# Patient Record
Sex: Male | Born: 1965 | Race: White | Hispanic: No | Marital: Single | Smoking: Never smoker
Health system: Southern US, Community
[De-identification: ages and names within clinical notes are randomized; demographics above are authoritative.]

## PROBLEM LIST (undated history)

## (undated) DIAGNOSIS — I10 Essential (primary) hypertension: Secondary | ICD-10-CM

## (undated) DIAGNOSIS — E039 Hypothyroidism, unspecified: Secondary | ICD-10-CM

## (undated) DIAGNOSIS — G2401 Drug induced subacute dyskinesia: Secondary | ICD-10-CM

## (undated) DIAGNOSIS — T7840XA Allergy, unspecified, initial encounter: Secondary | ICD-10-CM

## (undated) HISTORY — DX: Allergy, unspecified, initial encounter: T78.40XA

## (undated) HISTORY — PX: COLON SURGERY: SHX602

## (undated) HISTORY — PX: ANKLE SURGERY: SHX546

## (undated) HISTORY — PX: TONSILLECTOMY: SUR1361

## (undated) HISTORY — PX: HERNIA REPAIR: SHX51

---

## 2005-08-25 ENCOUNTER — Ambulatory Visit: Payer: Self-pay | Admitting: Surgery

## 2009-12-27 ENCOUNTER — Emergency Department: Payer: Self-pay | Admitting: Emergency Medicine

## 2009-12-29 ENCOUNTER — Emergency Department: Payer: Self-pay | Admitting: Unknown Physician Specialty

## 2013-04-29 ENCOUNTER — Emergency Department: Payer: Self-pay | Admitting: Emergency Medicine

## 2013-08-21 DIAGNOSIS — J309 Allergic rhinitis, unspecified: Secondary | ICD-10-CM | POA: Insufficient documentation

## 2013-08-21 DIAGNOSIS — J302 Other seasonal allergic rhinitis: Secondary | ICD-10-CM | POA: Insufficient documentation

## 2013-08-21 DIAGNOSIS — S82209A Unspecified fracture of shaft of unspecified tibia, initial encounter for closed fracture: Secondary | ICD-10-CM | POA: Insufficient documentation

## 2015-05-09 DIAGNOSIS — R6250 Unspecified lack of expected normal physiological development in childhood: Secondary | ICD-10-CM | POA: Insufficient documentation

## 2015-05-09 DIAGNOSIS — E343 Short stature due to endocrine disorder: Secondary | ICD-10-CM | POA: Insufficient documentation

## 2015-05-09 DIAGNOSIS — E3431 Constitutional short stature: Secondary | ICD-10-CM | POA: Insufficient documentation

## 2015-05-09 DIAGNOSIS — Z800 Family history of malignant neoplasm of digestive organs: Secondary | ICD-10-CM | POA: Insufficient documentation

## 2015-05-09 DIAGNOSIS — I10 Essential (primary) hypertension: Secondary | ICD-10-CM | POA: Insufficient documentation

## 2015-06-24 LAB — HM COLONOSCOPY

## 2015-09-26 DIAGNOSIS — R600 Localized edema: Secondary | ICD-10-CM | POA: Insufficient documentation

## 2016-03-18 DIAGNOSIS — M1A9XX1 Chronic gout, unspecified, with tophus (tophi): Secondary | ICD-10-CM | POA: Insufficient documentation

## 2016-06-16 ENCOUNTER — Other Ambulatory Visit: Payer: Self-pay | Admitting: Infectious Diseases

## 2016-06-16 ENCOUNTER — Ambulatory Visit
Admission: RE | Admit: 2016-06-16 | Discharge: 2016-06-16 | Disposition: A | Discharge status: Home / Self Care | Payer: Medicare Other | Source: Ambulatory Visit | Attending: Infectious Diseases | Admitting: Infectious Diseases

## 2016-06-16 DIAGNOSIS — R600 Localized edema: Secondary | ICD-10-CM | POA: Insufficient documentation

## 2018-01-18 ENCOUNTER — Encounter: Payer: Self-pay | Admitting: Psychiatry

## 2018-01-18 ENCOUNTER — Ambulatory Visit: Payer: 59 | Admitting: Psychiatry

## 2018-01-18 ENCOUNTER — Other Ambulatory Visit: Payer: Self-pay

## 2018-01-18 VITALS — BP 115/83 | HR 71 | Temp 97.7°F | Ht 64.0 in | Wt 174.4 lb

## 2018-01-18 DIAGNOSIS — F39 Unspecified mood [affective] disorder: Secondary | ICD-10-CM | POA: Diagnosis not present

## 2018-01-18 DIAGNOSIS — G2119 Other drug induced secondary parkinsonism: Secondary | ICD-10-CM | POA: Diagnosis not present

## 2018-01-18 DIAGNOSIS — R6250 Unspecified lack of expected normal physiological development in childhood: Secondary | ICD-10-CM | POA: Insufficient documentation

## 2018-01-18 DIAGNOSIS — F70 Mild intellectual disabilities: Secondary | ICD-10-CM

## 2018-01-18 MED ORDER — BENZTROPINE MESYLATE 1 MG PO TABS: 1.0000 mg | ORAL_TABLET | Freq: Two times a day (BID) | ORAL | 1 refills | Status: DC

## 2018-01-18 NOTE — Progress Notes (Signed)
Psychiatric Initial Adult Assessment   Patient Identification: Andres [name] MRN:  [id number] Date of Evaluation:  01/18/2018 Referral Source: Webb Silversmith NP  Chief Complaint:   ' I have these tremors.'  Chief Complaint    Establish Care; Depression     Visit Diagnosis:    ICD-10-CM   1. 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 Episodic mood disorder (HCC) F39   3. Mild intellectual disability F70     History of Present Illness:Andres [name] is a 52 year old Caucasian male, has a history of mood disorder, recent onset drug-induced Parkinson's disease, mild intellectual disability, single ,on SSD, lives in [place] , presented today to establish care.  [name] has mild intellectual disability and appears to be a limited historian.  Hence his father Andres [name] and his Sister Andres [name] participated in the assessment.  According to family , Hameed had a few admissions to [facility] in the 1980s.  Patient at that time had a nervous breakdown.  They are not aware of his diagnosis.  According to family during that time he was started on medications.  He was started on medications like risperidone and Depakote and he continues to take them.  He does not have any significant mood disorder symptoms right now.  But per family if he stops taking his medication for for a while he can have some increased agitation and irritability and also some hallucinations.  And according to them this can happen once every year or so.  He is currently following up with a psychiatrist in [facility]. [place].  Dr. Mitzi Hansen has been prescribing home Depakote 1500 mg and risperidone 3 mg daily.  He had a recent appointment with Dr. Mitzi Hansen and his risperidone dose was reduced to 3 mg at that time from 4 mg.  According to family patient started having some movement disorder symptoms, tremors of bilateral hands as well as some postural tremors.  His tremors worsened the past 2 months or so.   He also appeared to have some gait instability/balance issues.  His primary medical doctor referred him to an ENT specialist and  the ENT specialist after evaluation send him to a neurologist.  As per neurology consult done on 01/09/2018 -per EHR - Dr.Shah -most likely drug-induced from Depakote and risperidone.  His propranolol was discontinued due to low blood pressure and heart rate.  He was started on Cogentin 0.5 mg twice a day. Patient also was referred to our clinic here for continued care.  As per family patient currently does not have any other problems.  Denies any perceptual disturbances at this time.  Denies any irritability or anger issues at this time.  Denies any history of manic symptoms.  Denies any history of trauma.  Denies any history of substance abuse problems.  Denies any history of significant medical problems other than his current troubles and balance problems.  His father is his payee.  He spends his time watching TV.  He is able to take care of his ADLs.      Associated Signs/Symptoms: Depression Symptoms:  denies  (Hypo) Manic Symptoms:  Distractibility, Impulsivity, Irritable Mood, Labiality of Mood, Anxiety Symptoms:  denies Psychotic Symptoms:  Hallucinations: Auditory- in the past  PTSD Symptoms: Negative  Past Psychiatric History: History of mental health issues since 78.  He does have intellectual disability likely mild.  He was admitted to [facility] in [place] x3 years ago .  As per family they did not know  the exact diagnosis but notes that he had a nervous breakdown.  Denies any suicide attempts.  He was seeing Dr. Uvaldo Rising for outpatient psychiatry in the past , thereafter started seeing Dr. Mitzi Hansen in [place].  Previous Psychotropic Medications: Yes , Risperidone , Depakote, propanolol.  Substance Abuse History in the last 12 months:  No.  Consequences of Substance Abuse: Negative  Past Medical History:  Past Medical History:   Diagnosis Date  . Depression     Past Surgical History:  Procedure Laterality Date  . ANKLE SURGERY    . COLON SURGERY    . HERNIA REPAIR    . TONSILLECTOMY      Family Psychiatric History: Denies any history of mental health problems in family.  Denies any history of suicide in family.  Family History:  Family History  Problem Relation Age of Onset  . Colon cancer Other   . Mental illness Neg Hx     Social History:   Social History   Socioeconomic History  . Marital status: Single    Spouse name: None  . Number of children: 0  . Years of education: None  . Highest education level: None  Social Needs  . Financial resource strain: Not hard at all  . Food insecurity - worry: Never true  . Food insecurity - inability: Never true  . Transportation needs - medical: Yes  . Transportation needs - non-medical: Yes  Occupational History    Comment: disabled  Tobacco Use  . Smoking status: Never Smoker  . Smokeless tobacco: Never Used  Substance and Sexual Activity  . Alcohol use: No    Frequency: Never  . Drug use: No  . Sexual activity: Not Currently  Other Topics Concern  . None  Social History Narrative  . None    Additional Social History: Patient currently lives with his parents in [place].  He has a history of mild intellectual disability.  He graduated high school.  His father is his payee .  He is on disability.  He has 3 other siblings and they are supportive.  Allergies:  No Known Allergies  Metabolic Disorder Labs: No results found for: HGBA1C, MPG No results found for: PROLACTIN No results found for: CHOL, TRIG, HDL, CHOLHDL, VLDL, LDLCALC   Current Medications: Current Outpatient Medications  Medication Sig Dispense Refill  . divalproex (DEPAKOTE) 500 MG DR tablet Take by mouth.    . fexofenadine (ALLEGRA) 180 MG tablet Take 180 mg by mouth.    . indomethacin (INDOCIN) 50 MG capsule Take by mouth.    . risperiDONE (RISPERDAL) 1 MG tablet Take  by mouth.    . benztropine (COGENTIN) 1 MG tablet Take 1 tablet (1 mg total) by mouth 2 (two) times daily. 60 tablet 1   No current facility-administered medications for this visit.     Neurologic: Headache: No Seizure: No Paresthesias:No  Musculoskeletal: Strength & Muscle Tone: within normal limits Gait & Station: normal Patient leans: N/A  Psychiatric Specialty Exam: Review of Systems  Neurological: Positive for tremors.  All other systems reviewed and are negative.   Blood pressure 115/83, pulse 71, temperature 97.7 F (36.5 C), temperature source Oral, height 5\' 4"  (1.626 m), weight 174 lb 6.4 oz (79.1 kg).Body mass index is 29.94 kg/m.  General Appearance: Casual  Eye Contact:  Fair  Speech:  Normal Rate  Volume:  Normal  Mood:  Euthymic  Affect:  Congruent  Thought Process:  Goal Directed and Descriptions of Associations: Intact  Orientation:  Full (Time, Place, and Person)  Thought Content:  Logical  Suicidal Thoughts:  No  Homicidal Thoughts:  denies  Memory:  Immediate;   Fair Recent;   Fair Remote;   Fair  Judgement:  Fair  Insight:  Fair  Psychomotor Activity:  Tremor  Concentration:  Concentration: Fair and Attention Span: Fair  Recall:  [employer]Fair  Fund of Knowledge:Fair  Language: Fair  Akathisia:  No  Handed:  Right  AIMS (if indicated):  12  Assets:  Desire for Improvement Financial Resources/Insurance Housing Social Support  ADL's:  Intact  Cognition: Impaired,  Mild  Sleep:  fair    Treatment Plan Summary: [name] is a 52 year old Caucasian male with history of likely mild intellectual disability, mood disorder, drug induced parkinsonism, presented to the clinic today to establish care.  Patient was referred to our clinic by his neurologist Dr. Sherryll BurgerShah as well as his PMD.  Patient had recent medication changes, his risperidone was reduced to 3 mg as well as Cogentin added to help address his possible drug-induced Parkinson's symptoms.  Patient  otherwise denies any significant mood symptoms at this time.  He denies any suicidality or homicidality.  He denies any substance abuse problems.  He does have good support system from family. Medication management and Plan as noted below   Plan  For mood disorder Continue risperidone as scheduled.  Per family he is on 3 mg at this time. Continue Depakote 1500 mg p.o. nightly AIMS = 12  For drug-induced Parkinson's Increase Cogentin to 1 mg p.o. twice daily We will continue to monitor his symptoms.  I have reviewed reviewed  neurology consult in EHR Per Dr. Sherryll BurgerShah  Will get the following labs- vitamin B12, folate, vitamin D, CBC, CMP, Depakote level, prolactin  Patient to sign release to obtain medical records from his psychiatrist - Dr.Moody.  Follow-up in 3 weeks or sooner if needed.  More than 50 % of the time was spent for psychoeducation and supportive psychotherapy and care coordination.  This note was generated in part or whole with voice recognition software. Voice recognition is usually quite accurate but there are transcription errors that can and very often do occur. I apologize for any typographical errors that were not detected and corrected.       Jomarie LongsSaramma Zacari Stiff, MD 1/23/20194:04 PM

## 2018-02-08 ENCOUNTER — Other Ambulatory Visit: Payer: Self-pay

## 2018-02-08 ENCOUNTER — Encounter: Payer: Self-pay | Admitting: Psychiatry

## 2018-02-08 ENCOUNTER — Ambulatory Visit (INDEPENDENT_AMBULATORY_CARE_PROVIDER_SITE_OTHER): Payer: 59 | Admitting: Psychiatry

## 2018-02-08 VITALS — BP 110/74 | HR 73 | Wt 178.8 lb

## 2018-02-08 DIAGNOSIS — F39 Unspecified mood [affective] disorder: Secondary | ICD-10-CM

## 2018-02-08 DIAGNOSIS — G2119 Other drug induced secondary parkinsonism: Secondary | ICD-10-CM

## 2018-02-08 DIAGNOSIS — F79 Unspecified intellectual disabilities: Secondary | ICD-10-CM

## 2018-02-08 MED ORDER — DIVALPROEX SODIUM ER 500 MG PO TB24: 500.0000 mg | ORAL_TABLET | Freq: Every day | ORAL | 0 refills | Status: DC

## 2018-02-08 MED ORDER — QUETIAPINE FUMARATE 25 MG PO TABS: 25.0000 mg | ORAL_TABLET | Freq: Every day | ORAL | 0 refills | Status: DC

## 2018-02-08 NOTE — Progress Notes (Signed)
BH MD OP Progress Note  02/08/2018 10:18 AM Andres [name]  MRN:  [id number]  Chief Complaint: ' I am ok." Chief Complaint    Follow-up; Medication Refill     HPI: Andres [name] a 52 year old Caucasian male, has a history of mood disorder, recent onset drug-induced Parkinson's disease, mild intellectual disability, single, on SSD, lives in [place], presented to the clinic for a follow-up visit.  He has mild intellectual disability and appears to be limited historian.  His Father Ree Kida and his Sister Lupita Leash participated in the assessment and provided collateral information.  Cleo reports he is doing better with regards to his involuntary movements.  He continues to have tremors of bilateral hands as well as tongue.  He is currently on Cogentin, increased dose and is tolerating well.  His risperidone dose was reduced last time and he tolerated it well.  He denies any mood symptoms or perceptual disturbances.  He continues to take Depakote on a regular basis.  He takes 1500 Depakote at bedtime.  However discussed with the patient as well as family that Depakote can be gradually tapered off since it could be causing his tremors as well as thrombocytopenia although chronic, stable.  His primary medical doctor faxed over his recent lab results to Clinical research associate .  His Depakote level is still pending though.  He reports sleep is good.  He denies any other significant concerns at this time. Visit Diagnosis:    ICD-10-CM   1. Episodic mood disorder (HCC) F39 QUEtiapine (SEROQUEL) 25 MG tablet    divalproex (DEPAKOTE ER) 500 MG 24 hr tablet  2. Intellectual disability F79 divalproex (DEPAKOTE ER) 500 MG 24 hr tablet   likely mild  3. Drug-induced Parkinsonism (HCC) G21.19     Past Psychiatric History: History of mental health issues since 65.  He does have intellectual disability likely mild.  He was admitted to [facility] in [place] times 3 years ago.  As per family they did not know the exact  diagnosis but notes that he had a nervous breakdown.  Denies any suicide attempts.  He was seeing Dr. Uvaldo Rising for outpatient psychiatry in the past, thereafter started seeing Dr. Mitzi Hansen in [place].  Past trials of risperidone, Depakote, propranolol.  Past Medical History:  Past Medical History:  Diagnosis Date  . Depression     Past Surgical History:  Procedure Laterality Date  . ANKLE SURGERY    . COLON SURGERY    . HERNIA REPAIR    . TONSILLECTOMY      Family Psychiatric History: Denies any history of mental health problems in family.  Denies any history of suicide in family.  Family History:  Family History  Problem Relation Age of Onset  . Colon cancer Other   . Mental illness Neg Hx    Substance abuse history: Denies  Social History: Currently lives with his parents in [place].  He has a history of mild intellectual disability.  He graduated high school.  His father is his payee.  He is on disability.  He has 3 other siblings and they are supportive. Social History   Socioeconomic History  . Marital status: Single    Spouse name: None  . Number of children: 0  . Years of education: None  . Highest education level: None  Social Needs  . Financial resource strain: Not hard at all  . Food insecurity - worry: Never true  . Food insecurity - inability: Never true  . Transportation needs -  medical: Yes  . Transportation needs - non-medical: Yes  Occupational History    Comment: disabled  Tobacco Use  . Smoking status: Never Smoker  . Smokeless tobacco: Never Used  Substance and Sexual Activity  . Alcohol use: No    Frequency: Never  . Drug use: No  . Sexual activity: Not Currently  Other Topics Concern  . None  Social History Narrative  . None    Allergies: No Known Allergies  Metabolic Disorder Labs: No results found for: HGBA1C, MPG No results found for: PROLACTIN No results found for: CHOL, TRIG, HDL, CHOLHDL, VLDL, LDLCALC No results found for:  TSH  Therapeutic Level Labs: No results found for: LITHIUM No results found for: VALPROATE No components found for:  CBMZ  Current Medications: Current Outpatient Medications  Medication Sig Dispense Refill  . allopurinol (ZYLOPRIM) 300 MG tablet Take 300 mg by mouth daily.    . benztropine (COGENTIN) 1 MG tablet Take 1 tablet (1 mg total) by mouth 2 (two) times daily. 60 tablet 1  . divalproex (DEPAKOTE ER) 500 MG 24 hr tablet Take 1 tablet (500 mg total) by mouth at bedtime. 30 tablet 0  . fexofenadine (ALLEGRA) 180 MG tablet Take 180 mg by mouth.    . indomethacin (INDOCIN) 50 MG capsule Take by mouth.    . propranolol (INDERAL) 40 MG tablet Take 40 mg by mouth 2 (two) times daily.    . QUEtiapine (SEROQUEL) 25 MG tablet Take 1 tablet (25 mg total) by mouth at bedtime. 30 tablet 0   No current facility-administered medications for this visit.      Musculoskeletal: Strength & Muscle Tone: within normal limits Gait & Station: normal Patient leans: N/A  Psychiatric Specialty Exam: Review of Systems  Neurological: Positive for tremors.  Psychiatric/Behavioral: The patient is nervous/anxious.   All other systems reviewed and are negative.   Blood pressure 110/74, pulse 73, weight 178 lb 12.8 oz (81.1 kg).Body mass index is 30.69 kg/m.  General Appearance: Casual  Eye Contact:  Fair  Speech:  Clear and Coherent  Volume:  Normal  Mood:  Anxious  Affect:  Appropriate  Thought Process:  Goal Directed and Descriptions of Associations: Intact  Orientation:  Full (Time, Place, and Person)  Thought Content: Logical   Suicidal Thoughts:  No  Homicidal Thoughts:  No  Memory:  Immediate;   Fair Recent;   Fair Remote;   Fair  Judgement:  Fair  Insight:  Fair  Psychomotor Activity:  Tremor  Concentration:  Concentration: Fair and Attention Span: Fair  Recall:  [employer] of Knowledge: Fair  Language: Fair  Akathisia:  No  Handed:  Right  AIMS (if indicated): 9  Assets:   Communication Skills Desire for Improvement Housing Social Support  ADL's:  Intact  Cognition: WNL  Sleep:  Fair   Screenings: AIMS     [facility] Visit from 02/08/2018 in [facility] Office Visit from 01/18/2018 in [facility]  AIMS Total Score  9  12       Assessment and Plan: [name] is a 52 year old Caucasian male with history of likely mild intellectual disability, mood disorder, drug induced parkinsonism, presented to the clinic today for a follow-up visit.  Patient was initially referred to the clinic by his neurologist Dr. Sherryll Burger as well as his PMD.  Patient had recent medication changes, his risperidone was reduced as well as Cogentin added to help address his possible drug-induced Parkinson's symptoms.  Kacin continues  to have tremors of his bilateral hands.  He tolerated the dose reduction of risperidone well.  Hence we will discontinue risperidone.  We will readjust his medications.  Will continue plan as noted below.  Plan For mood disorder Discontinue risperidone due to side effects of drug-induced parkinsonism. We will taper off Depakote gradually.  Will start at reduced dose of Depakote ER 500 mg p.o. nightly for now. Start Seroquel 25 mg p.o. Nightly to replace Risperidone.   For drug induced parkinsonism Continue Cogentin 1 mg p.o. twice daily Will taper of risperidone due to his abnormal movements.  We have not yet obtained any medical records from his previous psychiatrist Dr. Mitzi HansenMoody.  This is patient's second visit with Clinical research associatewriter.  We will try to obtain records again.    I have reviewed notes in EHR Per his other providers as well as neurologist.   Reviewed labs done on 01/20/2018-vitamin B12-579, folate 7.7-(within normal limits), valproic acid-(pending), vitamin D-21.3-(low), prolactin-(elevated )at 54.7 hemoglobin A1c-4.3-(within normal limits), TSH-9.647-(elevated), lipid panel-(within normal limits), CBC with  differential-WBC-3.8-(low), RBC-4.64-(borderline), hemoglobin --14.5-, hematocrit-44.5, MCV-95.9, platelet count-64-(low-chronic), neutrophils-1.44(low), CMP-sodium 146-(borderline high), BUN-32-(elevated), creatinine-0.9- wnl These results were faxed to writer by his provider-PMD Dr. Maralyn Sagoavid Fitzgerald-he has repeat TSH scheduled and vitamin D replacement has been started.  He will continue to follow-up with PMD for his abnormal labs.  But will readjust his Depakote to possibly address not only tremors but also abnormal platelets.  Follow up in clinic in 2 weeks .   More than 50 % of the time was spent for psychoeducation and supportive psychotherapy and care coordination.  This note was generated in part or whole with voice recognition software. Voice recognition is usually quite accurate but there are transcription errors that can and very often do occur. I apologize for any typographical errors that were not detected and corrected.        Jomarie LongsSaramma Brendy Ficek, MD 02/09/2018, 10:18 AM

## 2018-02-08 NOTE — Patient Instructions (Addendum)
Quetiapine tablets What is this medicine? QUETIAPINE (kwe TYE a peen) is an antipsychotic. It is used to treat schizophrenia and bipolar disorder, also known as manic-depression. This medicine may be used for other purposes; ask your health care provider or pharmacist if you have questions. COMMON BRAND NAME(S): Seroquel What should I tell my health care provider before I take this medicine? They need to know if you have any of these conditions: -brain tumor or head injury -breast cancer -cataracts -diabetes -difficulty swallowing -heart disease -kidney disease -liver disease -low blood counts, like low white cell, platelet, or red cell counts -low blood pressure or dizziness when standing up -Parkinson's disease -previous heart attack -seizures -suicidal thoughts, plans, or attempt by you or a family member -thyroid disease -an unusual or allergic reaction to quetiapine, other medicines, foods, dyes, or preservatives -pregnant or trying to get pregnant -breast-feeding How should I use this medicine? Take this medicine by mouth. Swallow it with a drink of water. Follow the directions on the prescription label. If it upsets your stomach you can take it with food. Take your medicine at regular intervals. Do not take it more often than directed. Do not stop taking except on the advice of your doctor or health care professional. A special MedGuide will be given to you by the pharmacist with each prescription and refill. Be sure to read this information carefully each time. Talk to your pediatrician regarding the use of this medicine in children. While this drug may be prescribed for children as young as 10 years for selected conditions, precautions do apply. Patients over age 50 years may have a stronger reaction to this medicine and need smaller doses. Overdosage: If you think you have taken too much of this medicine contact a poison control center or emergency room at once. NOTE: This  medicine is only for you. Do not share this medicine with others. What if I miss a dose? If you miss a dose, take it as soon as you can. If it is almost time for your next dose, take only that dose. Do not take double or extra doses. What may interact with this medicine? Do not take this medicine with any of the following medications: -certain medicines for fungal infections like fluconazole, itraconazole, ketoconazole, posaconazole, voriconazole -cisapride -dofetilide -dronedarone -droperidol -grepafloxacin -halofantrine -phenothiazines like chlorpromazine, mesoridazine, thioridazine -pimozide -sparfloxacin -ziprasidone This medicine may also interact with the following medications: -alcohol -antiviral medicines for HIV or AIDS -certain medicines for blood pressure -certain medicines for depression, anxiety, or psychotic disturbances like haloperidol, lorazepam -certain medicines for diabetes -certain medicines for Parkinson's disease -certain medicines for seizures like carbamazepine, phenobarbital, phenytoin -cimetidine -erythromycin -other medicines that prolong the QT interval (cause an abnormal heart rhythm) -rifampin -steroid medicines like prednisone or cortisone This list may not describe all possible interactions. Give your health care provider a list of all the medicines, herbs, non-prescription drugs, or dietary supplements you use. Also tell them if you smoke, drink alcohol, or use illegal drugs. Some items may interact with your medicine. What should I watch for while using this medicine? Visit your doctor or health care professional for regular checks on your progress. It may be several weeks before you see the full effects of this medicine. Your health care provider may suggest that you have your eyes examined prior to starting this medicine, and every 6 months thereafter. If you have been taking this medicine regularly for some time, do not suddenly stop taking it.  You must gradually  reduce the dose or your symptoms may get worse. Ask your doctor or health care professional for advice. Patients and their families should watch out for worsening depression or thoughts of suicide. Also watch out for sudden or severe changes in feelings such as feeling anxious, agitated, panicky, irritable, hostile, aggressive, impulsive, severely restless, overly excited and hyperactive, or not being able to sleep. If this happens, especially at the beginning of antidepressant treatment or after a change in dose, call your health care professional. Bonita QuinYou may get dizzy or drowsy. Do not drive, use machinery, or do anything that needs mental alertness until you know how this medicine affects you. Do not stand or sit up quickly, especially if you are an older patient. This reduces the risk of dizzy or fainting spells. Alcohol can increase dizziness and drowsiness. Avoid alcoholic drinks. Do not treat yourself for colds, diarrhea or allergies. Ask your doctor or health care professional for advice, some ingredients may increase possible side effects. This medicine can reduce the response of your body to heat or cold. Dress warm in cold weather and stay hydrated in hot weather. If possible, avoid extreme temperatures like saunas, hot tubs, very hot or cold showers, or activities that can cause dehydration such as vigorous exercise. What side effects may I notice from receiving this medicine? Side effects that you should report to your doctor or health care professional as soon as possible: -allergic reactions like skin rash, itching or hives, swelling of the face, lips, or tongue -difficulty swallowing -fast or irregular heartbeat -fever or chills, sore throat -fever with rash, swollen lymph nodes, or swelling of the face -increased hunger or thirst -increased urination -problems with balance, talking, walking -seizures -stiff muscles -suicidal thoughts or other mood  changes -uncontrollable head, mouth, neck, arm, or leg movements -unusually weak or tired Side effects that usually do not require medical attention (report to your doctor or health care professional if they continue or are bothersome): -change in sex drive or performance -constipation -drowsy or dizzy -dry mouth -stomach upset -weight gain This list may not describe all possible side effects. Call your doctor for medical advice about side effects. You may report side effects to FDA at 1-800-FDA-1088. Where should I keep my medicine? Keep out of the reach of children. Store at room temperature between 15 and 30 degrees C (59 and 86 degrees F). Throw away any unused medicine after the expiration date. NOTE: This sheet is a summary. It may not cover all possible information. If you have questions about this medicine, talk to your doctor, pharmacist, or health care provider.  2018 Elsevier/Gold Standard (2015-06-17 13:07:35)     STOP RISPERDAL DUE TO SIDE EFFECTS OF TREMORS.  TAPER OFF DEPAKOTE ER - NOT ONLY FOR TREMORS BUT ALSO DUE TO HAVING THROMBOCYTOPENIA. HENCE WILL REDUCE DEPAKOTE ER TO - 1 TABLET AT NIGHT.  START SEROQUEL TO REPLACE RISPERDAL. SEROQUEL HAS A VERY LOW EPS PROFILE - THAT MEANS IT DOES NOT CAUSE TREMORS LIKE RISPERDAL AND OTHER ANTIPSYCHOTICS .  WILL CONTINUE COGENTIN AS PRESCRIBED - 1 MG TWICE A DAY FOR TREMORS.

## 2018-02-09 ENCOUNTER — Encounter: Payer: Self-pay | Admitting: Psychiatry

## 2018-02-22 ENCOUNTER — Encounter: Payer: Self-pay | Admitting: Psychiatry

## 2018-02-22 ENCOUNTER — Other Ambulatory Visit: Payer: Self-pay

## 2018-02-22 ENCOUNTER — Ambulatory Visit (INDEPENDENT_AMBULATORY_CARE_PROVIDER_SITE_OTHER): Payer: Medicare Other | Admitting: Psychiatry

## 2018-02-22 VITALS — BP 112/82 | HR 61 | Wt 173.8 lb

## 2018-02-22 DIAGNOSIS — F39 Unspecified mood [affective] disorder: Secondary | ICD-10-CM

## 2018-02-22 DIAGNOSIS — G2119 Other drug induced secondary parkinsonism: Secondary | ICD-10-CM

## 2018-02-22 DIAGNOSIS — F79 Unspecified intellectual disabilities: Secondary | ICD-10-CM | POA: Diagnosis not present

## 2018-02-22 MED ORDER — LAMOTRIGINE 25 MG PO TABS: 25.0000 mg | ORAL_TABLET | Freq: Every day | ORAL | 1 refills | Status: DC

## 2018-02-22 MED ORDER — BENZTROPINE MESYLATE 1 MG PO TABS: 1.0000 mg | ORAL_TABLET | Freq: Two times a day (BID) | ORAL | 1 refills | Status: DC

## 2018-02-22 MED ORDER — QUETIAPINE FUMARATE 25 MG PO TABS: 25.0000 mg | ORAL_TABLET | Freq: Every day | ORAL | 1 refills | Status: DC

## 2018-02-22 NOTE — Patient Instructions (Signed)
STOP DEPAKOTE.  START LAMICTAL 25 MG AT BEDTIME.  CONTINUE YOUR OTHER MEDICATIONS , NO CHANGES WITH THEM.  WILL SEE YOU BACK IN TWO WEEKS.

## 2018-02-22 NOTE — Progress Notes (Signed)
[facility] MD OP Progress Note  02/22/2018 9:36 AM Andres [name]  MRN:  [id number]  Chief Complaint:  Chief Complaint    Follow-up; Medication Refill     HPI: Andres [name] is a 52 year old Caucasian male, has a history of mood disorder, recent onset drug-induced Parkinson's disease, mild intellectual disability, single, on SSD, [place], presented to the clinic today for a follow-up visit.  Andres [name] has history of mood disorder as well as mild intellectual disability.  He is a limited historian.  He presented with his Father 'Andres [name]' and his Sister 'Andres [name] ',who usually accompanies him to his appointments.  His family provided collateral information.  Andres [name] today reports he did not notice any worsening of his mood after the recent med changes.  He reports that he is tolerating the Seroquel as well as the reduced dose of Depakote well.   He continues to have tremors of his bilateral hands as well as his tongues.  However they have improved.  He is currently on Cogentin and is tolerating it well.  Discontinuing his risperidone helped with the involuntary movements.  He continues to be able to take care of himself as well as his ADLs.  He is socially active, goes out with his father , eats out with his family and continues to have good social support from his family.   Visit Diagnosis:    ICD-10-CM   1. Episodic mood disorder (HCC) F39 QUEtiapine (SEROQUEL) 25 MG tablet    lamoTRIgine (LAMICTAL) 25 MG tablet  2. 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 Intellectual disability F79    likely mild    Past Psychiatric History: History of mental health issues since [id number].  He does have intellectual disability likely mild.  He [facility] [place] .  Only they did not know the exact diagnosis.  But notes that he had a nervous breakdown.  Denies any suicide attempts.  He was seeing Dr. Uvaldo [name] for outpatient psychiatry in the past, thereafter  started seeing Dr. Mitzi [name] in [place].  Past trials of risperidone, Depakote, propranolol.  Past Medical History:  Past Medical History:  Diagnosis Date  . Depression     Past Surgical History:  Procedure Laterality Date  . ANKLE SURGERY    . COLON SURGERY    . HERNIA REPAIR    . TONSILLECTOMY      Family Psychiatric History: Denies any history of mental health problems in the family.  Denies any history of suicide in family  Family History:  Family History  Problem Relation Age of Onset  . Colon cancer Other   . Mental illness Neg Hx    Substance abuse history: Denies  Social History: He lives with his parents in [place].  He has a history of mild intellectual disability.  He graduated high school.  His father is his payee.  He is on disability.  He has 3 other siblings and they are supportive. Social History   Socioeconomic History  . Marital status: Single    Spouse name: None  . Number of children: 0  . Years of education: None  . Highest education level: None  Social Needs  . Financial resource strain: Not hard at all  . Food insecurity - worry: Never true  . Food insecurity - inability: Never true  . Transportation needs - medical: Yes  . Transportation needs - non-medical: Yes  Occupational History    Comment: disabled  Tobacco Use  .  Smoking status: Never Smoker  . Smokeless tobacco: Never Used  Substance and Sexual Activity  . Alcohol use: No    Frequency: Never  . Drug use: No  . Sexual activity: Not Currently  Other Topics Concern  . None  Social History Narrative  . None    Allergies: No Known Allergies  Metabolic Disorder Labs: No results found for: HGBA1C, MPG No results found for: PROLACTIN No results found for: CHOL, TRIG, HDL, CHOLHDL, VLDL, LDLCALC No results found for: TSH  Therapeutic Level Labs: No results found for: LITHIUM No results found for: VALPROATE No components found for:  CBMZ  Current Medications: Current  Outpatient Medications  Medication Sig Dispense Refill  . allopurinol (ZYLOPRIM) 300 MG tablet Take 300 mg by mouth daily.    . benztropine (COGENTIN) 1 MG tablet Take 1 tablet (1 mg total) by mouth 2 (two) times daily. 60 tablet 1  . fexofenadine (ALLEGRA) 180 MG tablet Take 180 mg by mouth.    . indomethacin (INDOCIN) 50 MG capsule Take by mouth.    . propranolol (INDERAL) 40 MG tablet Take 40 mg by mouth 2 (two) times daily.    . QUEtiapine (SEROQUEL) 25 MG tablet Take 1 tablet (25 mg total) by mouth at bedtime. 30 tablet 1  . lamoTRIgine (LAMICTAL) 25 MG tablet Take 1 tablet (25 mg total) by mouth at bedtime. 30 tablet 1   No current facility-administered medications for this visit.      Musculoskeletal: Strength & Muscle Tone: within normal limits Gait & Station: normal Patient leans: N/A  Psychiatric Specialty Exam: Review of Systems  Neurological: Positive for tremors.  Psychiatric/Behavioral: The patient is nervous/anxious (Improving).   All other systems reviewed and are negative.   Blood pressure 112/82, pulse 61, weight 173 lb 12.8 oz (78.8 kg).Body mass index is 29.83 kg/m.  General Appearance: Casual  Eye Contact:  Fair  Speech:  Normal Rate  Volume:  Normal  Mood:  Anxious  Affect:  Congruent  Thought Process:  Goal Directed and Descriptions of Associations: Intact  Orientation:  Full (Time, Place, and Person)  Thought Content: Logical   Suicidal Thoughts:  No  Homicidal Thoughts:  No  Memory:  Immediate;   Fair Recent;   Fair Remote;   Fair  Judgement:  Fair  Insight:  Fair  Psychomotor Activity:  Tremor  Concentration:  Concentration: Fair and Attention Span: Fair  Recall:  [employer] of Knowledge: Fair  Language: Fair  Akathisia:  No  Handed:  Right  AIMS (if indicated): 8  Assets:  Communication Skills Desire for Improvement Housing Social Support Transportation  ADL's:  Intact  Cognition: limited , mild ID  Sleep:  Fair    Screenings: AIMS     Office Visit from 02/22/2018 in [facility] Office Visit from 02/08/2018 in [facility] Office Visit from 01/18/2018 in [facility]  AIMS Total Score  8  9  12        Assessment and Plan: Keylen is a 52 year old Caucasian male with history of likely mild intellectual disability, mood disorder, drug induced parkinsonism, presented to the clinic today for a follow-up visit.  Patient was initially referred to the clinic by his neurologist Dr. Sherryll Burger as well as his PMD.  Currently tolerating the medication readjustment as well as discontinuation of risperidone.  He continues to have drug-induced involuntary movements although improving.  He has good social support system and his family is here today for  the appointment.  We will continue to readjust medications.  Plan For mood disorder We will continue Seroquel 25 mg p.o. nightly Will discontinue Depakote due to side effects. We will start Lamictal 25 mg p.o. Nightly  For drug-induced parkinsonism Continue Cogentin 1 mg p.o. twice daily Per patient and family his involuntary movements are getting better. Continue to monitor. Aims = 8  We have not yet obtained any medical records from his previous psychiatrist Dr. Mitzi Hansen.  We will continue to attempt.  Follow-up in clinic in 2 weeks or sooner if needed.  More than 50 % of the time was spent for psychoeducation and supportive psychotherapy and care coordination.  This note was generated in part or whole with voice recognition software. Voice recognition is usually quite accurate but there are transcription errors that can and very often do occur. I apologize for any typographical errors that were not detected and corrected.       Jomarie Longs, MD 02/22/2018, 9:36 AM

## 2018-02-24 ENCOUNTER — Telehealth: Payer: Self-pay

## 2018-02-24 DIAGNOSIS — F39 Unspecified mood [affective] disorder: Secondary | ICD-10-CM

## 2018-02-24 MED ORDER — QUETIAPINE FUMARATE 25 MG PO TABS: 25.0000 mg | ORAL_TABLET | Freq: Every day | ORAL | 0 refills | Status: DC

## 2018-02-24 NOTE — Telephone Encounter (Signed)
Spoke to [name] ( sister), will sent another script to pharmacy. She will supervise patient's medications and make sure he takes it right .

## 2018-02-24 NOTE — Telephone Encounter (Signed)
pt sister called she was upset she states that her brother lives with her dad and that her brother thought he was suppose to take 2 of the seroquel and now he only has 5 pills left.  what can she do.   QUEtiapine (SEROQUEL) 25 MG tablet  Medication  Date: 02/22/2018 Department: [facility] Ordering/Authorizing: Jomarie LongsEappen, Saramma, MD  Order Providers   Prescribing Provider Encounter Provider  Jomarie LongsEappen, Saramma, MD Jomarie LongsEappen, Saramma, MD  Medication Detail    Disp Refills Start End   QUEtiapine (SEROQUEL) 25 MG tablet 30 tablet 1 02/22/2018    Sig - Route: Take 1 tablet (25 mg total) by mouth at bedtime. - Oral   Sent to pharmacy as: QUEtiapine (SEROQUEL) 25 MG tablet   E-Prescribing Status: Receipt confirmed by pharmacy (02/22/2018 8:44 AM EST)   Associated Diagnoses   Episodic mood disorder (HCC) - Primary

## 2018-02-28 DIAGNOSIS — E559 Vitamin D deficiency, unspecified: Secondary | ICD-10-CM | POA: Insufficient documentation

## 2018-02-28 DIAGNOSIS — E039 Hypothyroidism, unspecified: Secondary | ICD-10-CM | POA: Insufficient documentation

## 2018-03-08 ENCOUNTER — Other Ambulatory Visit: Payer: Self-pay

## 2018-03-08 ENCOUNTER — Telehealth: Payer: Self-pay

## 2018-03-08 ENCOUNTER — Ambulatory Visit (INDEPENDENT_AMBULATORY_CARE_PROVIDER_SITE_OTHER): Payer: Medicare Other | Admitting: Psychiatry

## 2018-03-08 ENCOUNTER — Encounter: Payer: Self-pay | Admitting: Psychiatry

## 2018-03-08 VITALS — BP 129/88 | HR 56 | Temp 97.6°F | Wt 168.0 lb

## 2018-03-08 DIAGNOSIS — G2401 Drug induced subacute dyskinesia: Secondary | ICD-10-CM | POA: Diagnosis not present

## 2018-03-08 DIAGNOSIS — G2119 Other drug induced secondary parkinsonism: Secondary | ICD-10-CM | POA: Diagnosis not present

## 2018-03-08 DIAGNOSIS — F39 Unspecified mood [affective] disorder: Secondary | ICD-10-CM | POA: Diagnosis not present

## 2018-03-08 DIAGNOSIS — F79 Unspecified intellectual disabilities: Secondary | ICD-10-CM

## 2018-03-08 MED ORDER — VALBENAZINE TOSYLATE 40 MG PO CAPS: 40.0000 mg | ORAL_CAPSULE | Freq: Every day | ORAL | 0 refills | Status: DC

## 2018-03-08 MED ORDER — TRAZODONE HCL 50 MG PO TABS: 50.0000 mg | ORAL_TABLET | Freq: Every evening | ORAL | 1 refills | Status: DC | PRN

## 2018-03-08 NOTE — Telephone Encounter (Signed)
tried to cal patient but received the voicemail .  message was left for pt to contact our office in regards to him signing a form for the ingrezza

## 2018-03-08 NOTE — Patient Instructions (Signed)
Valbenazine oral capsules What is this medicine? VALBENAZINE (val BEN a zeen) is used to treat the involuntary movements caused by tardive dyskinesia. This medicine may be used for other purposes; ask your health care provider or pharmacist if you have questions. COMMON BRAND NAME(S): INGREZZA What should I tell my health care provider before I take this medicine? They need to know if you have any of these conditions: -heart disease -history of irregular heartbeat -if you often drink alcohol -kidney disease -liver disease -an unusual or allergic reaction to valbenazine, other medicines, foods, dyes, or preservatives -pregnant or trying to get pregnant -breast-feeding How should I use this medicine? Take this medicine by mouth with a glass of water. Follow the directions on the prescription label. You can take it with or without food. Take your medicine at regular intervals. Do not take it more often than directed. Do not stop taking except on your doctor's advice. Talk to your pediatrician regarding the use of this medicine in children. Special care may be needed. Overdosage: If you think you have taken too much of this medicine contact a poison control center or emergency room at once. NOTE: This medicine is only for you. Do not share this medicine with others. What if I miss a dose? If you miss a dose, take it as soon as you can. If it is almost time for your next dose, take only that dose. Do not take double or extra doses. What may interact with this medicine? Do not take this medicine with any of the following medications: -deutetrabenazine -tetrabenazine This medicine may also interact with the following medications: -alcohol -certain medicines for fungal infections like ketoconazole and itraconazole -certain medicines for seizures like carbamazepine, phenytoin -clarithromycin -digoxin -fluoxetine -MAOIs like Carbex, Eldepryl, Marplan, Nardil, and Parnate -medicines for  sleep -paroxetine -quinidine -rifampin -St. John's Wort This list may not describe all possible interactions. Give your health care provider a list of all the medicines, herbs, non-prescription drugs, or dietary supplements you use. Also tell them if you smoke, drink alcohol, or use illegal drugs. Some items may interact with your medicine. What should I watch for while using this medicine? Visit your doctor or health care professional for regular checks on your progress. Tell your doctor or healthcare professional if your symptoms do not start to get better or if they get worse. You may get drowsy or dizzy. Do not drive, use machinery, or do anything that needs mental alertness until you know how this medicine affects you. Do not stand or sit up quickly, especially if you are an older patient. This reduces the risk of dizzy or fainting spells. Alcohol may interfere with the effect of this medicine. Talk to your doctor or pharmacist before drinking alcoholic beverages. This medicine may cause constipation. Try to have a bowel movement at least every 2 to 3 days. If you do not have a bowel movement for 3 days, call your doctor or health care professional. Your mouth may get dry. Chewing sugarless gum or sucking hard candy, and drinking plenty of water may help. Contact your doctor if the problem does not go away or is severe. What side effects may I notice from receiving this medicine? Side effects that you should report to your doctor or health care professional as soon as possible: -allergic reactions like skin rash, itching or hives, swelling of the face, lips, or tongue -loss of balance or coordination, falls -restlessness, pacing, inability to keep still -signs and symptoms of a dangerous   change in heartbeat or heart rhythm like chest pain; dizziness; fast or irregular heartbeat; palpitations; feeling faint or lightheaded, falls; breathing problems -signs and symptoms of high blood sugar such as  dizziness; dry mouth; dry skin; fruity breath; nausea; stomach pain; increased hunger or thirst; increased urination -uncontrollable body movements Side effects that usually do not require medical attention (report these to your doctor or health care professional if they continue or are bothersome): -constipation -drowsiness -dry mouth -headache -nausea This list may not describe all possible side effects. Call your doctor for medical advice about side effects. You may report side effects to FDA at 1-800-FDA-1088. Where should I keep my medicine? Keep out of the reach of children. Store at room temperature between 15 and 30 degrees C (59 and 86 degrees F). Throw away any unused medicine after the expiration date. NOTE: This sheet is a summary. It may not cover all possible information. If you have questions about this medicine, talk to your doctor, pharmacist, or health care provider.  2018 Elsevier/Gold Standard (2016-10-02 17:13:05) Trazodone tablets What is this medicine? TRAZODONE (TRAZ oh done) is used to treat depression. This medicine may be used for other purposes; ask your health care provider or pharmacist if you have questions. COMMON BRAND NAME(S): Desyrel What should I tell my health care provider before I take this medicine? They need to know if you have any of these conditions: -attempted suicide or thinking about it -bipolar disorder -bleeding problems -glaucoma -heart disease, or previous heart attack -irregular heart beat -kidney or liver disease -low levels of sodium in the blood -an unusual or allergic reaction to trazodone, other medicines, foods, dyes or preservatives -pregnant or trying to get pregnant -breast-feeding How should I use this medicine? Take this medicine by mouth with a glass of water. Follow the directions on the prescription label. Take this medicine shortly after a meal or a light snack. Take your medicine at regular intervals. Do not take  your medicine more often than directed. Do not stop taking this medicine suddenly except upon the advice of your doctor. Stopping this medicine too quickly may cause serious side effects or your condition may worsen. A special MedGuide will be given to you by the pharmacist with each prescription and refill. Be sure to read this information carefully each time. Talk to your pediatrician regarding the use of this medicine in children. Special care may be needed. Overdosage: If you think you have taken too much of this medicine contact a poison control center or emergency room at once. NOTE: This medicine is only for you. Do not share this medicine with others. What if I miss a dose? If you miss a dose, take it as soon as you can. If it is almost time for your next dose, take only that dose. Do not take double or extra doses. What may interact with this medicine? Do not take this medicine with any of the following medications: -certain medicines for fungal infections like fluconazole, itraconazole, ketoconazole, posaconazole, voriconazole -cisapride -dofetilide -dronedarone -linezolid -MAOIs like Carbex, Eldepryl, Marplan, Nardil, and Parnate -mesoridazine -methylene blue (injected into a vein) -pimozide -saquinavir -thioridazine -ziprasidone This medicine may also interact with the following medications: -alcohol -antiviral medicines for HIV or AIDS -aspirin and aspirin-like medicines -barbiturates like phenobarbital -certain medicines for blood pressure, heart disease, irregular heart beat -certain medicines for depression, anxiety, or psychotic disturbances -certain medicines for migraine headache like almotriptan, eletriptan, frovatriptan, naratriptan, rizatriptan, sumatriptan, zolmitriptan -certain medicines for seizures like carbamazepine and  phenytoin -certain medicines for sleep -certain medicines that treat or prevent blood clots like dalteparin, enoxaparin,  warfarin -digoxin -fentanyl -lithium -NSAIDS, medicines for pain and inflammation, like ibuprofen or naproxen -other medicines that prolong the QT interval (cause an abnormal heart rhythm) -rasagiline -supplements like St. John's wort, kava kava, valerian -tramadol -tryptophan This list may not describe all possible interactions. Give your health care provider a list of all the medicines, herbs, non-prescription drugs, or dietary supplements you use. Also tell them if you smoke, drink alcohol, or use illegal drugs. Some items may interact with your medicine. What should I watch for while using this medicine? Tell your doctor if your symptoms do not get better or if they get worse. Visit your doctor or health care professional for regular checks on your progress. Because it may take several weeks to see the full effects of this medicine, it is important to continue your treatment as prescribed by your doctor. Patients and their families should watch out for new or worsening thoughts of suicide or depression. Also watch out for sudden changes in feelings such as feeling anxious, agitated, panicky, irritable, hostile, aggressive, impulsive, severely restless, overly excited and hyperactive, or not being able to sleep. If this happens, especially at the beginning of treatment or after a change in dose, call your health care professional. Bonita Quin may get drowsy or dizzy. Do not drive, use machinery, or do anything that needs mental alertness until you know how this medicine affects you. Do not stand or sit up quickly, especially if you are an older patient. This reduces the risk of dizzy or fainting spells. Alcohol may interfere with the effect of this medicine. Avoid alcoholic drinks. This medicine may cause dry eyes and blurred vision. If you wear contact lenses you may feel some discomfort. Lubricating drops may help. See your eye doctor if the problem does not go away or is severe. Your mouth may get dry.  Chewing sugarless gum, sucking hard candy and drinking plenty of water may help. Contact your doctor if the problem does not go away or is severe. What side effects may I notice from receiving this medicine? Side effects that you should report to your doctor or health care professional as soon as possible: -allergic reactions like skin rash, itching or hives, swelling of the face, lips, or tongue -elevated mood, decreased need for sleep, racing thoughts, impulsive behavior -confusion -fast, irregular heartbeat -feeling faint or lightheaded, falls -feeling agitated, angry, or irritable -loss of balance or coordination -painful or prolonged erections -restlessness, pacing, inability to keep still -suicidal thoughts or other mood changes -tremors -trouble sleeping -seizures -unusual bleeding or bruising Side effects that usually do not require medical attention (report to your doctor or health care professional if they continue or are bothersome): -change in sex drive or performance -change in appetite or weight -constipation -headache -muscle aches or pains -nausea This list may not describe all possible side effects. Call your doctor for medical advice about side effects. You may report side effects to FDA at 1-800-FDA-1088. Where should I keep my medicine? Keep out of the reach of children. Store at room temperature between 15 and 30 degrees C (59 to 86 degrees F). Protect from light. Keep container tightly closed. Throw away any unused medicine after the expiration date. NOTE: This sheet is a summary. It may not cover all possible information. If you have questions about this medicine, talk to your doctor, pharmacist, or health care provider.  2018 Elsevier/Gold Standard (2016-05-13  16:57:05)  

## 2018-03-08 NOTE — Telephone Encounter (Signed)
THANKS

## 2018-03-08 NOTE — Telephone Encounter (Signed)
dr. Elna Bresloweappen gave me a ingrezza treatment form and rx for the pt .  the form needed patient signature. will need to call pt and ask if he can come by an sign form.

## 2018-03-08 NOTE — Progress Notes (Signed)
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  [name] today presented along with his father, also his payee.  His father provided collateral information.  [name] reports his mood as stable.  He tolerated coming off the Depakote and the risperidone well.  He was taken off of those medication due to his increased tremors as well as abnormal involuntary movements.  He is currently on Seroquel which he is tolerating well.  Patient as well as his father reports his movement symptoms may have improved a little bit compared to how it was before.  He was able to hold a fork and feed himself without much problem last night.  However on examination ,AIMS scale continues to be 9.  When he came in it was 12, hence even though it may be improving he still needs help at this time.  Discussed starting a medication called Ingrezza , patient as well as his father agrees.  Provided them information to read up on.  Patient continues to report some sleep problems.  Discussed sleep hygiene with patient.  Also discussed adding trazodone 50 mg as needed.  Patient as well as father agrees with plan.  Visit Diagnosis:    ICD-10-CM   1. Episodic mood disorder (HCC) F39 traZODone (DESYREL) 50 MG tablet  2. 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 Tardive dyskinesia G24.01 Valbenazine Tosylate (INGREZZA) 40 MG CAPS    Past Psychiatric History: Hx of mental health issues since 52.  He does have intellectual disability likely mild.  He was admitted to [facility] in [place] in the past .  Only they did not know the exact diagnosis.  But notes that he had a nervous breakdown.   Denies any suicide attempts.  He was seeing Dr.McNeil for outpatient psychiatry in the past, thereafter started seeing Dr. Mitzi Hansen in [place].  Past trials of risperidone, Depakote, propranolol.  Past Medical History:  Past Medical History:  Diagnosis Date  . Depression     Past Surgical History:  Procedure Laterality Date  . ANKLE SURGERY    . COLON SURGERY    . HERNIA REPAIR    . TONSILLECTOMY      Family Psychiatric History: Denies any history of mental health problems in the family.  Denies any history of suicide in family.  Family History:  Family History  Problem Relation Age of Onset  . Colon cancer Other   . Mental illness Neg Hx    Substance abuse history: Denies  Social History: He lives with his parents in [place].  He has a history of mild intellectual disability.  He graduated high school.  His father is his payee.  He is on disability.  He has 3 other siblings and they are supportive. Social History   Socioeconomic History  . Marital status: Single    Spouse name: None  . Number of children: 0  . Years of education: None  . Highest education level: None  Social Needs  . Financial resource strain: Not hard at all  . Food insecurity - worry: Never true  .  Food insecurity - inability: Never true  . Transportation needs - medical: Yes  . Transportation needs - non-medical: Yes  Occupational History    Comment: disabled  Tobacco Use  . Smoking status: Never Smoker  . Smokeless tobacco: Never Used  Substance and Sexual Activity  . Alcohol use: No    Frequency: Never  . Drug use: No  . Sexual activity: Not Currently  Other Topics Concern  . None  Social History Narrative  . None    Allergies: No Known Allergies  Metabolic Disorder Labs: No results found for: HGBA1C, MPG No results found for: PROLACTIN No results found for: CHOL, TRIG, HDL, CHOLHDL, VLDL, LDLCALC No results found for: TSH  Therapeutic Level Labs: No results found for:  LITHIUM No results found for: VALPROATE No components found for:  CBMZ  Current Medications: Current Outpatient Medications  Medication Sig Dispense Refill  . allopurinol (ZYLOPRIM) 300 MG tablet Take 300 mg by mouth daily.    . benztropine (COGENTIN) 1 MG tablet Take 1 tablet (1 mg total) by mouth 2 (two) times daily. 60 tablet 1  . fexofenadine (ALLEGRA) 180 MG tablet Take 180 mg by mouth.    . indomethacin (INDOCIN) 50 MG capsule Take by mouth.    . lamoTRIgine (LAMICTAL) 25 MG tablet Take 1 tablet (25 mg total) by mouth at bedtime. 30 tablet 1  . propranolol (INDERAL) 40 MG tablet Take 40 mg by mouth 2 (two) times daily.    . QUEtiapine (SEROQUEL) 25 MG tablet Take 1 tablet (25 mg total) by mouth at bedtime. 90 tablet 0  . traZODone (DESYREL) 50 MG tablet Take 1 tablet (50 mg total) by mouth at bedtime as needed for sleep. 30 tablet 1  . Valbenazine Tosylate (INGREZZA) 40 MG CAPS Take 40-80 mg by mouth daily. As directed. Take 40 mg for 7 days and then start taking 80 mg after that 53 capsule 0   No current facility-administered medications for this visit.      Musculoskeletal: Strength & Muscle Tone: within normal limits Gait & Station: normal Patient leans: N/A  Psychiatric Specialty Exam: Review of Systems  Neurological: Positive for tremors.  Psychiatric/Behavioral: The patient is nervous/anxious and has insomnia.   All other systems reviewed and are negative.   Blood pressure 129/88, pulse (!) 56, temperature 97.6 F (36.4 C), temperature source Oral, weight 168 lb (76.2 kg).Body mass index is 28.84 kg/m.  General Appearance: Casual  Eye Contact:  Fair  Speech:  Normal Rate  Volume:  Normal  Mood:  Anxious  Affect:  Congruent  Thought Process:  Goal Directed and Descriptions of Associations: Intact  Orientation:  Full (Time, Place, and Person)  Thought Content: Logical   Suicidal Thoughts:  No  Homicidal Thoughts:  No  Memory:  Immediate;   Fair Recent;    Fair Remote;   Fair  Judgement:  Fair  Insight:  Fair  Psychomotor Activity:  Tremor  Concentration:  Concentration: Fair and Attention Span: Fair  Recall:  [employer] of Knowledge: Fair  Language: Fair  Akathisia:  No  Handed:  Right  AIMS (if indicated): 9  Assets:  Communication Skills Desire for Improvement Housing Social Support  ADL's:  Intact  Cognition: limited - has ID   Sleep:  restless   Screenings: AIMS     Office Visit from 03/08/2018 in [facility] Office Visit from 02/22/2018 in [facility] Visit from 02[facility]08/2018 in [facility]  Visit from [facility]/18/2018 [facility]  AIMS Total Score  9  8  9  12        Assessment and Plan: [name] is a 52 year old Caucasian male with history of 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  He was initially referred to the clinic by his neurologist Dr. Sherryll BurgerShah as well as his PMD.  Patient presented with severe abnormal involuntary movements likely due to being on antipsychotic therapy for a long time.  Patient was taken off of the risperidone as well as Depakote and he tolerated the readjustment well.  He continues to have abnormal involuntary movements although it is reported as improving at this point.  He continues to have good social support from his family.  Discussed medication changes with patient as well as family.  Will continue plan as noted below.  Plan For mood disorder We will continue Seroquel 25 mg p.o. nightly Continue Lamictal 25 mg p.o. 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 twice daily for now. However will try to start patient on Ingrezza, patient as well as family to fill up the information for the same. AIMS =  9  Insomnia Start trazodone 50 mg p.o. nightly as needed  Follow-up in clinic in 3 weeks or sooner if needed.  More than 50 % of the time was spent for psychoeducation and supportive psychotherapy and care coordination.  This note was generated in part or whole with voice recognition software. Voice recognition is usually quite accurate but there are transcription errors that can and very often do occur. I apologize for any typographical errors that were not detected and corrected.      Jomarie LongsSaramma Ireoluwa Gorsline, MD 03/09/2018, 8:44 AM

## 2018-03-09 ENCOUNTER — Encounter: Payer: Self-pay | Admitting: Psychiatry

## 2018-03-20 NOTE — Telephone Encounter (Signed)
prior Berkley Harveyauth was denied.  I will start on the appeal.

## 2018-03-20 NOTE — Telephone Encounter (Signed)
Ok thanks 

## 2018-03-20 NOTE — Telephone Encounter (Signed)
prior auth was done online at [email or web address]

## 2018-03-20 NOTE — Telephone Encounter (Signed)
Prior Andres [name] was needed.

## 2018-03-24 ENCOUNTER — Other Ambulatory Visit: Payer: Self-pay | Admitting: Psychiatry

## 2018-03-24 DIAGNOSIS — G2401 Drug induced subacute dyskinesia: Secondary | ICD-10-CM

## 2018-03-24 MED ORDER — VALBENAZINE TOSYLATE 40 MG PO CAPS: 40.0000 mg | ORAL_CAPSULE | Freq: Every day | ORAL | 2 refills | Status: DC

## 2018-03-24 NOTE — Telephone Encounter (Signed)
Patient approved for Ingrezza 30 mg for 30 days , treatment sent to pharmacy.

## 2018-03-29 ENCOUNTER — Other Ambulatory Visit: Payer: Self-pay

## 2018-03-29 ENCOUNTER — Ambulatory Visit (INDEPENDENT_AMBULATORY_CARE_PROVIDER_SITE_OTHER): Payer: Medicare Other | Admitting: Psychiatry

## 2018-03-29 ENCOUNTER — Encounter: Payer: Self-pay | Admitting: Psychiatry

## 2018-03-29 VITALS — BP 129/88 | HR 67 | Wt 169.8 lb

## 2018-03-29 DIAGNOSIS — G2119 Other drug induced secondary parkinsonism: Secondary | ICD-10-CM

## 2018-03-29 DIAGNOSIS — F70 Mild intellectual disabilities: Secondary | ICD-10-CM

## 2018-03-29 DIAGNOSIS — F39 Unspecified mood [affective] disorder: Secondary | ICD-10-CM

## 2018-03-29 MED ORDER — LAMOTRIGINE 25 MG PO TABS: 25.0000 mg | ORAL_TABLET | Freq: Every day | ORAL | 1 refills | Status: DC

## 2018-03-29 MED ORDER — BENZTROPINE MESYLATE 1 MG PO TABS: 1.0000 mg | ORAL_TABLET | Freq: Two times a day (BID) | ORAL | 1 refills | Status: DC

## 2018-03-29 NOTE — Progress Notes (Signed)
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  Andres [name] today presented along with his father also his payee as well as his Sister Andres [name].  Patient today reports his mood as stable.  He is tolerating the new medication well.  He denies any irritability.  He reports he continues to struggle with sleep at times.  He was prescribed trazodone as needed last visit.  However per family he does not take it every night.  Discussed with patient to take it daily if he has sleep issues.  Patient agrees with plan.  Patient continues to have some movement problems of his bilateral hands.  He has been struggling with this since a very long time.  However per family as well as patient his movement problems may have improved a little bit after he was taking off the Depakote and the risperidone.  He was prescribed Ingrezza, and awaiting insurance to approve it.     Patient continues to have good support system from his family  Visit Diagnosis:    ICD-10-CM   1. Episodic mood disorder (HCC) F39 lamoTRIgine (LAMICTAL) 25 MG tablet  2. 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 Mild intellectual disability F70     Past Psychiatric History: Hx of mental health issues since 36.  He does have intellectual disability likely mild.  He was admitted to [facility] in [place] in the past.  Only they did not know the exact diagnosis.  But notes that he had a nervous breakdown.  Denies any suicide attempts.  He was seeing Dr. Uvaldo Rising for outpatient psychiatry in the past thereafter started seeing Dr. Mitzi Hansen in [place].  Past trials of risperidone,  Depakote, propranolol.  Past Medical History:  Past Medical History:  Diagnosis Date  . Depression     Past Surgical History:  Procedure Laterality Date  . ANKLE SURGERY    . COLON SURGERY    . HERNIA REPAIR    . TONSILLECTOMY      Family Psychiatric History: Denies any history of mental health problems in the family.  Denies any history of suicide in family.  Family History:  Family History  Problem Relation Age of Onset  . Colon cancer Other   . Mental illness Neg Hx    Substance abuse history: Denies  Social History: Lives with his parents in [place].  He has a history of mild intellectual disability.  He graduated high school.  His father is his payee.  He is on disability.  He has 3 other siblings and they are supportive. Social History   Socioeconomic History  . Marital status: Single    Spouse name: Not on file  . Number of children: 0  . Years of education: Not on file  . Highest education level: Not on file  Occupational History    Comment: disabled  Social Needs  . Financial resource strain: Not hard at all  . Food insecurity:    Worry: Never true    Inability: Never true  . Transportation needs:    Medical: Yes    Non-medical: Yes  Tobacco Use  .  Smoking status: Never Smoker  . Smokeless tobacco: Never Used  Substance and Sexual Activity  . Alcohol use: No    Frequency: Never  . Drug use: No  . Sexual activity: Not Currently  Lifestyle  . Physical activity:    Days per week: 0 days    Minutes per session: 0 min  . Stress: Not at all  Relationships  . Social connections:    Talks on phone: More than three times a week    Gets together: More than three times a week    Attends religious service: Never    Active member of club or organization: No    Attends meetings of clubs or organizations: Never    Relationship status: Never married  Other Topics Concern  . Not on file  Social History Narrative  . Not on file    Allergies: No Known  Allergies  Metabolic Disorder Labs: No results found for: HGBA1C, MPG No results found for: PROLACTIN No results found for: CHOL, TRIG, HDL, CHOLHDL, VLDL, LDLCALC No results found for: TSH  Therapeutic Level Labs: No results found for: LITHIUM No results found for: VALPROATE No components found for:  CBMZ  Current Medications: Current Outpatient Medications  Medication Sig Dispense Refill  . allopurinol (ZYLOPRIM) 300 MG tablet Take 300 mg by mouth daily.    . benztropine (COGENTIN) 1 MG tablet Take 1 tablet (1 mg total) by mouth 2 (two) times daily. 60 tablet 1  . fexofenadine (ALLEGRA) 180 MG tablet Take 180 mg by mouth.    . indomethacin (INDOCIN) 50 MG capsule Take by mouth.    . lamoTRIgine (LAMICTAL) 25 MG tablet Take 1 tablet (25 mg total) by mouth at bedtime. 30 tablet 1  . propranolol (INDERAL) 40 MG tablet Take 40 mg by mouth 2 (two) times daily.    . QUEtiapine (SEROQUEL) 25 MG tablet Take 1 tablet (25 mg total) by mouth at bedtime. 90 tablet 0  . traZODone (DESYREL) 50 MG tablet Take 1 tablet (50 mg total) by mouth at bedtime as needed for sleep. 30 tablet 1  . Valbenazine Tosylate (INGREZZA) 40 MG CAPS Take 40 mg by mouth at bedtime. 30 capsule 2   No current facility-administered medications for this visit.      Musculoskeletal: Strength & Muscle Tone: within normal limits Gait & Station: normal Patient leans: N/A  Psychiatric Specialty Exam: Review of Systems  Psychiatric/Behavioral: The patient is nervous/anxious and has insomnia.   All other systems reviewed and are negative.   Blood pressure 129/88, pulse 67, weight 169 lb 12.8 oz (77 kg).Body mass index is 29.15 kg/m.  General Appearance: Casual  Eye Contact:  Fair  Speech:  Clear and Coherent  Volume:  Normal  Mood:  Dysphoric  Affect:  Congruent  Thought Process:  Goal Directed and Descriptions of Associations: Intact  Orientation:  Full (Time, Place, and Person)  Thought Content: Logical    Suicidal Thoughts:  No  Homicidal Thoughts:  No  Memory:  Immediate;   Fair Recent;   Fair Remote;   Fair  Judgement:  Fair  Insight:  Fair  Psychomotor Activity:  Tremor  Concentration:  Concentration: Fair and Attention Span: Fair  Recall:  [employer]Fair  Fund of Knowledge: Fair  Language: Fair  Akathisia:  No  Handed:  Right  AIMS (if indicated): 8  Assets:  Communication Skills Desire for Improvement Social Support  ADL's:  Intact  Cognition: WNL  Sleep:  restless   Screenings: AIMS  Office Visit from 03/08/2018 in [facility] Office Visit from 02/22/2018 in [facility] Office Visit from 02/08/2018 in [facility] Office Visit from 01/18/2018 in [facility]  AIMS Total Score  9  8  9  12        Assessment and Plan: Harlon a 52 year old Caucasian male with history of mild intellectual disability, mood disorder, 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  Patient was initially referred to the clinic by his neurologist Dr. Sherryll Burger as well as his PMD.  Patient presented with severe abnormal involuntary movements likely to be due to antipsychotic therapy.  Patient was taken off of some of the medications like risperidone and Depakote.  He continues to do well with the medication changes.  Patient has some sleep issues.  He has not been taking the trazodone as prescribed.  Discussed this with patient.  Provided medication education.  Will continue plan as noted below.  Plan For mood disorder Continue Seroquel 25 mg p.o. nightly Continue Lamictal 25 mg p.o. 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 Prescribed Ingrezza 40 mg - 80 mg p.o. nightly, pending. AIMS = 8  Insomnia Trazodone 25-50 mg p.o. nightly as needed  Follow-up in clinic in 1 month or sooner if  needed.  More than 50 % of the time was spent for psychoeducation and supportive psychotherapy and care coordination.  This note was generated in part or whole with voice recognition software. Voice recognition is usually quite accurate but there are transcription errors that can and very often do occur. I apologize for any typographical errors that were not detected and corrected.           Jomarie Longs, MD 03/29/2018, 2:59 PM

## 2018-03-29 NOTE — Patient Instructions (Signed)
Valbenazine oral capsules What is this medicine? VALBENAZINE (val BEN a zeen) is used to treat the involuntary movements caused by tardive dyskinesia. This medicine may be used for other purposes; ask your health care provider or pharmacist if you have questions. COMMON BRAND NAME(S): INGREZZA What should I tell my health care provider before I take this medicine? They need to know if you have any of these conditions: -heart disease -history of irregular heartbeat -if you often drink alcohol -kidney disease -liver disease -an unusual or allergic reaction to valbenazine, other medicines, foods, dyes, or preservatives -pregnant or trying to get pregnant -breast-feeding How should I use this medicine? Take this medicine by mouth with a glass of water. Follow the directions on the prescription label. You can take it with or without food. Take your medicine at regular intervals. Do not take it more often than directed. Do not stop taking except on your doctor's advice. Talk to your pediatrician regarding the use of this medicine in children. Special care may be needed. Overdosage: If you think you have taken too much of this medicine contact a poison control center or emergency room at once. NOTE: This medicine is only for you. Do not share this medicine with others. What if I miss a dose? If you miss a dose, take it as soon as you can. If it is almost time for your next dose, take only that dose. Do not take double or extra doses. What may interact with this medicine? Do not take this medicine with any of the following medications: -deutetrabenazine -tetrabenazine This medicine may also interact with the following medications: -alcohol -certain medicines for fungal infections like ketoconazole and itraconazole -certain medicines for seizures like carbamazepine, phenytoin -clarithromycin -digoxin -fluoxetine -MAOIs like Carbex, Eldepryl, Marplan, Nardil, and Parnate -medicines for  sleep -paroxetine -quinidine -rifampin -St. John's Wort This list may not describe all possible interactions. Give your health care provider a list of all the medicines, herbs, non-prescription drugs, or dietary supplements you use. Also tell them if you smoke, drink alcohol, or use illegal drugs. Some items may interact with your medicine. What should I watch for while using this medicine? Visit your doctor or health care professional for regular checks on your progress. Tell your doctor or healthcare professional if your symptoms do not start to get better or if they get worse. You may get drowsy or dizzy. Do not drive, use machinery, or do anything that needs mental alertness until you know how this medicine affects you. Do not stand or sit up quickly, especially if you are an older patient. This reduces the risk of dizzy or fainting spells. Alcohol may interfere with the effect of this medicine. Talk to your doctor or pharmacist before drinking alcoholic beverages. This medicine may cause constipation. Try to have a bowel movement at least every 2 to 3 days. If you do not have a bowel movement for 3 days, call your doctor or health care professional. Your mouth may get dry. Chewing sugarless gum or sucking hard candy, and drinking plenty of water may help. Contact your doctor if the problem does not go away or is severe. What side effects may I notice from receiving this medicine? Side effects that you should report to your doctor or health care professional as soon as possible: -allergic reactions like skin rash, itching or hives, swelling of the face, lips, or tongue -loss of balance or coordination, falls -restlessness, pacing, inability to keep still -signs and symptoms of a dangerous   change in heartbeat or heart rhythm like chest pain; dizziness; fast or irregular heartbeat; palpitations; feeling faint or lightheaded, falls; breathing problems -signs and symptoms of high blood sugar such as  dizziness; dry mouth; dry skin; fruity breath; nausea; stomach pain; increased hunger or thirst; increased urination -uncontrollable body movements Side effects that usually do not require medical attention (report these to your doctor or health care professional if they continue or are bothersome): -constipation -drowsiness -dry mouth -headache -nausea This list may not describe all possible side effects. Call your doctor for medical advice about side effects. You may report side effects to FDA at 1-800-FDA-1088. Where should I keep my medicine? Keep out of the reach of children. Store at room temperature between 15 and 30 degrees C (59 and 86 degrees F). Throw away any unused medicine after the expiration date. NOTE: This sheet is a summary. It may not cover all possible information. If you have questions about this medicine, talk to your doctor, pharmacist, or health care provider.  2018 Elsevier/Gold Standard (2016-10-02 17:13:05)  

## 2018-04-05 ENCOUNTER — Other Ambulatory Visit: Payer: Self-pay | Admitting: Psychiatry

## 2018-04-05 DIAGNOSIS — G2401 Drug induced subacute dyskinesia: Secondary | ICD-10-CM

## 2018-04-05 MED ORDER — VALBENAZINE TOSYLATE 80 MG PO CAPS: 80.0000 mg | ORAL_CAPSULE | Freq: Every day | ORAL | 6 refills | Status: DC

## 2018-04-05 NOTE — Telephone Encounter (Signed)
Will sent Ingrezza 80 mg to [facility] , [place] .

## 2018-04-06 ENCOUNTER — Telehealth: Payer: Self-pay

## 2018-04-06 NOTE — Telephone Encounter (Signed)
Pt was called and told that he was finally approved for ingrezza and that [facility] be [facility] but that dr. Elna Bresloweappen wanted him to pick up a 40mg  pack to start takin before taking the 80mg . Pt states he will come into office tomorrow to pick up sample pack.

## 2018-04-06 NOTE — Telephone Encounter (Signed)
Can we call patient and ask him to start the sample 40 mg first for 2 weeks and then start the 80 mg which was approved . He will need to come and pick up 40 mg from here . Please let him know. Thanks.

## 2018-04-06 NOTE — Telephone Encounter (Signed)
ingrezza capsule was approved from  01-05-18 to  04-05-2019

## 2018-04-25 ENCOUNTER — Encounter: Payer: Self-pay | Admitting: Psychiatry

## 2018-04-25 ENCOUNTER — Ambulatory Visit: Payer: Medicare Other | Admitting: Psychiatry

## 2018-04-25 ENCOUNTER — Other Ambulatory Visit: Payer: Self-pay

## 2018-04-25 VITALS — BP 132/90 | HR 83 | Wt 171.6 lb

## 2018-04-25 DIAGNOSIS — G2401 Drug induced subacute dyskinesia: Secondary | ICD-10-CM

## 2018-04-25 DIAGNOSIS — F251 Schizoaffective disorder, depressive type: Secondary | ICD-10-CM | POA: Diagnosis not present

## 2018-04-25 DIAGNOSIS — F70 Mild intellectual disabilities: Secondary | ICD-10-CM

## 2018-04-25 MED ORDER — LAMOTRIGINE 25 MG PO TABS: 25.0000 mg | ORAL_TABLET | Freq: Every day | ORAL | 1 refills | Status: DC

## 2018-04-25 MED ORDER — TRAZODONE HCL 50 MG PO TABS: 25.0000 mg | ORAL_TABLET | Freq: Every evening | ORAL | 2 refills | Status: DC | PRN

## 2018-04-25 NOTE — Progress Notes (Signed)
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  Patient presented along with his father who is his payee and [name].  Tryston today reports as improving on the current medication regimen.  He reports his mood symptoms as improved.  He does not have any mood swings or irritability.  He continues to take lamotrigine as prescribed.  He has been taking Seroquel as well as half dose of trazodone at bedtime.  He reports sleep is good.  He used to wake up at 2 AM in the past now he is able to sleep till around 5 AM in the morning.  He has started taking Ingrezza for TD.  He had severe movement problems of his bilateral hands which was making it difficult for him to feed himself and do other activities.  Aims score today seen as improved at 6.  He continues to have good support system from his family.     Visit Diagnosis:    ICD-10-CM   1. Schizoaffective disorder, depressive type (HCC) F25.1 lamoTRIgine (LAMICTAL) 25 MG tablet    traZODone (DESYREL) 50 MG tablet  2. Mild intellectual disability F70   3. Tardive dyskinesia G24.01     Past Psychiatric History: Have reviewed past psychiatric history from my progress note on 03/29/2018.  Past Medical History:  Past Medical History:  Diagnosis Date  . Depression     Past Surgical History:  Procedure Laterality Date  . ANKLE SURGERY    . COLON SURGERY    . HERNIA REPAIR    . TONSILLECTOMY      Family Psychiatric History: I have reviewed family psychiatric history from my progress note on 03/29/2018.  Family History:  Family History  Problem Relation Age of Onset  . Colon cancer Other   .  Mental illness Neg Hx    Substance abuse history: Denies  Social History: Lives with his parents in [place].  He has a history of mild intellectual disability.  He graduated high school.  His father is his payee.  He is on disability.  He has 3 other siblings and they are supportive. Social History   Socioeconomic History  . Marital status: Single    Spouse name: Not on file  . Number of children: 0  . Years of education: Not on file  . Highest education level: Not on file  Occupational History    Comment: disabled  Social Needs  . Financial resource strain: Not hard at all  . Food insecurity:    Worry: Never true    Inability: Never true  . Transportation needs:    Medical: Yes    Non-medical: Yes  Tobacco Use  . Smoking status: Never Smoker  . Smokeless tobacco: Never Used  Substance and Sexual Activity  . Alcohol use: No    Frequency: Never  . Drug use: No  . Sexual activity: Not Currently  Lifestyle  . Physical activity:    Days per week: 0 days    Minutes per session: 0 min  . Stress: Not at all  Relationships  . Social connections:    Talks  on phone: More than three times a week    Gets together: More than three times a week    Attends religious service: Never    Active member of club or organization: No    Attends meetings of clubs or organizations: Never    Relationship status: Never married  Other Topics Concern  . Not on file  Social History Narrative  . Not on file    Allergies: No Known Allergies  Metabolic Disorder Labs: No results found for: HGBA1C, MPG No results found for: PROLACTIN No results found for: CHOL, TRIG, HDL, CHOLHDL, VLDL, LDLCALC No results found for: TSH  Therapeutic Level Labs: No results found for: LITHIUM No results found for: VALPROATE No components found for:  CBMZ  Current Medications: Current Outpatient Medications  Medication Sig Dispense Refill  . allopurinol (ZYLOPRIM) 300 MG tablet Take 300 mg by mouth  daily.    . fexofenadine (ALLEGRA) 180 MG tablet Take 180 mg by mouth.    . indomethacin (INDOCIN) 50 MG capsule Take by mouth.    . lamoTRIgine (LAMICTAL) 25 MG tablet Take 1 tablet (25 mg total) by mouth at bedtime. 90 tablet 1  . propranolol (INDERAL) 40 MG tablet Take 40 mg by mouth 2 (two) times daily.    . QUEtiapine (SEROQUEL) 25 MG tablet Take 1 tablet (25 mg total) by mouth at bedtime. 90 tablet 0  . traZODone (DESYREL) 50 MG tablet Take 0.5 tablets (25 mg total) by mouth at bedtime as needed for sleep. 45 tablet 2  . Valbenazine Tosylate (INGREZZA) 80 MG CAPS Take 80 mg by mouth at bedtime. 30 capsule 6   No current facility-administered medications for this visit.      Musculoskeletal: Strength & Muscle Tone: within normal limits Gait & Station: normal Patient leans: N/A  Psychiatric Specialty Exam: Review of Systems  Psychiatric/Behavioral: The patient is nervous/anxious (improved).   All other systems reviewed and are negative.   Blood pressure 132/90, pulse 83, weight 171 lb 9.6 oz (77.8 kg).Body mass index is 29.46 kg/m.  General Appearance: Casual  Eye Contact:  Fair  Speech:  Clear and Coherent  Volume:  Normal  Mood:  Euthymic  Affect:  Congruent  Thought Process:  Goal Directed and Descriptions of Associations: Intact  Orientation:  Full (Time, Place, and Person)  Thought Content: Logical   Suicidal Thoughts:  No  Homicidal Thoughts:  No  Memory:  Immediate;   Fair Recent;   Fair Remote;   Fair  Judgement:  Fair  Insight:  Fair  Psychomotor Activity:  Tremor  Concentration:  Concentration: Fair and Attention Span: Fair  Recall:  [employer] of Knowledge: Fair  Language: Fair  Akathisia:  No  Handed:  Right  AIMS (if indicated): 6  Assets:  Communication Skills Desire for Improvement Housing Social Support  ADL's:  Intact  Cognition: WNL  Sleep:  Fair   Screenings: AIMS     Office Visit from 04/25/2018 in [facility] Office Visit from 03/08/2018 in [facility] Office Visit from 02/22/2018 in [facility] Office Visit from 02/08/2018 in [facility] Office Visit from 01/18/2018 in [facility]  AIMS Total Score  Assessment and Plan: Darric is a 52 year old Caucasian male with history of mild intellectual disability, mood disorder, TD, presented to the clinic today for a follow-up visit.  Patient  was initially referred to the clinic by his neurologist Dr. Sherryll Burger as well as his PMD.  Patient presented with severe abnormal involuntary movements disorder likely due to antipsychotic therapy.  Patient hence was taken off of some of medications like risperidone and Depakote which he was taking previously which may have contributed to the same.  Patient currently on Ingrezza, recently started.  Tolerating it well and his movement symptoms are improving.  Plan as noted below.  Plan For mood disorder Continue Seroquel 25 mg p.o. nightly Continue Lamictal 25 mg p.o. nightly  TD Continue Ingrezza 80 mg p.o. nightly Aims equals 6 Discontinue Cogentin.  Insomnia Trazodone 25 mg p.o. nightly as needed  I have reviewed EKG which was done by his primary medical doctor on March 13, 2018-[facility]-within normal limits.  Follow-up in clinic in 2 months or sooner if needed.  More than 50 % of the time was spent for psychoeducation and supportive psychotherapy and care coordination.  This note was generated in part or whole with voice recognition software. Voice recognition is usually quite accurate but there are transcription errors that can and very often do occur. I apologize for any typographical errors that were not detected and corrected.       Jomarie Longs, MD 04/25/2018, 4:31 PM

## 2018-04-25 NOTE — Patient Instructions (Signed)
Please stop taking Cogentin.  Continue Lamictal, trazodone, seroquel and Ingrezza.No changes made with the dosages today.

## 2018-05-02 ENCOUNTER — Telehealth: Payer: Self-pay

## 2018-05-02 NOTE — Telephone Encounter (Signed)
pt father called states that son woke up with rash all over him ? if it is the ingrezza.

## 2018-05-02 NOTE — Telephone Encounter (Signed)
  If rash is too severe  or getting worse he should go to nearest ED. Unable to say if its due to Ingrezza. But stop both lamictal and ingrezza now and he can be reassessed.

## 2018-05-02 NOTE — Telephone Encounter (Signed)
Called told to stop the lamictal and ingrezza . They will come in tomorrow.

## 2018-05-03 ENCOUNTER — Encounter: Payer: Self-pay | Admitting: Psychiatry

## 2018-05-03 ENCOUNTER — Emergency Department
Admission: EM | Admit: 2018-05-03 | Discharge: 2018-05-03 | Disposition: A | Discharge status: Home / Self Care | Payer: Medicare Other | Attending: Emergency Medicine | Admitting: Emergency Medicine

## 2018-05-03 ENCOUNTER — Encounter: Payer: Self-pay | Admitting: Emergency Medicine

## 2018-05-03 ENCOUNTER — Encounter: Payer: Self-pay | Admitting: Physician Assistant

## 2018-05-03 ENCOUNTER — Other Ambulatory Visit: Payer: Self-pay

## 2018-05-03 ENCOUNTER — Ambulatory Visit: Payer: Medicare Other | Admitting: Psychiatry

## 2018-05-03 VITALS — BP 134/91 | HR 123 | Wt 171.2 lb

## 2018-05-03 DIAGNOSIS — G2401 Drug induced subacute dyskinesia: Secondary | ICD-10-CM

## 2018-05-03 DIAGNOSIS — Z79899 Other long term (current) drug therapy: Secondary | ICD-10-CM | POA: Diagnosis not present

## 2018-05-03 DIAGNOSIS — E039 Hypothyroidism, unspecified: Secondary | ICD-10-CM | POA: Diagnosis not present

## 2018-05-03 DIAGNOSIS — I10 Essential (primary) hypertension: Secondary | ICD-10-CM | POA: Insufficient documentation

## 2018-05-03 DIAGNOSIS — F70 Mild intellectual disabilities: Secondary | ICD-10-CM

## 2018-05-03 DIAGNOSIS — F251 Schizoaffective disorder, depressive type: Secondary | ICD-10-CM

## 2018-05-03 DIAGNOSIS — T50905A Adverse effect of unspecified drugs, medicaments and biological substances, initial encounter: Secondary | ICD-10-CM

## 2018-05-03 DIAGNOSIS — T7840XA Allergy, unspecified, initial encounter: Secondary | ICD-10-CM | POA: Diagnosis not present

## 2018-05-03 DIAGNOSIS — R21 Rash and other nonspecific skin eruption: Secondary | ICD-10-CM | POA: Diagnosis present

## 2018-05-03 LAB — CBC WITH DIFFERENTIAL/PLATELET
BASOS ABS: 0 10*3/uL (ref 0–0.1)
BASOS PCT: 0 %
Eosinophils Absolute: 0 10*3/uL (ref 0–0.7)
Eosinophils Relative: 1 %
HCT: 45.9 % (ref 40.0–52.0)
Hemoglobin: 15.8 g/dL (ref 13.0–18.0)
Lymphocytes Relative: 15 %
Lymphs Abs: 1.3 10*3/uL (ref 1.0–3.6)
MCH: 30.8 pg (ref 26.0–34.0)
MCHC: 34.5 g/dL (ref 32.0–36.0)
MCV: 89.3 fL (ref 80.0–100.0)
Monocytes Absolute: 0.4 10*3/uL (ref 0.2–1.0)
Monocytes Relative: 5 %
Neutro Abs: 6.7 10*3/uL — ABNORMAL HIGH (ref 1.4–6.5)
Neutrophils Relative %: 79 %
PLATELETS: 154 10*3/uL (ref 150–440)
RBC: 5.14 MIL/uL (ref 4.40–5.90)
RDW: 14.2 % (ref 11.5–14.5)
WBC: 8.4 10*3/uL (ref 3.8–10.6)

## 2018-05-03 LAB — COMPREHENSIVE METABOLIC PANEL
ALBUMIN: 3.9 g/dL (ref 3.5–5.0)
ALT: 13 U/L — AB (ref 17–63)
AST: 25 U/L (ref 15–41)
Alkaline Phosphatase: 60 U/L (ref 38–126)
Anion gap: 7 (ref 5–15)
BUN: 30 mg/dL — AB (ref 6–20)
CHLORIDE: 110 mmol/L (ref 101–111)
CO2: 20 mmol/L — AB (ref 22–32)
CREATININE: 1.24 mg/dL (ref 0.61–1.24)
Calcium: 8.7 mg/dL — ABNORMAL LOW (ref 8.9–10.3)
GFR calc Af Amer: >60 mL/min (ref >60)
GFR calc non Af Amer: >60 mL/min (ref >60)
GLUCOSE: 119 mg/dL — AB (ref 65–99)
POTASSIUM: 3.7 mmol/L (ref 3.5–5.1)
Sodium: 137 mmol/L (ref 135–145)
Total Bilirubin: 1.1 mg/dL (ref 0.3–1.2)
Total Protein: 6.5 g/dL (ref 6.5–8.1)

## 2018-05-03 LAB — SEDIMENTATION RATE: Sed Rate: 9 mm/hr (ref 0–20)

## 2018-05-03 LAB — CK: Total CK: 102 U/L (ref 49–397)

## 2018-05-03 MED ORDER — DIPHENHYDRAMINE HCL 50 MG/ML IJ SOLN
50.0000 mg | Freq: Once | INTRAMUSCULAR | Status: AC
  Administered 2018-05-03: 50 mg via INTRAVENOUS
  Filled 2018-05-03 (×2): qty 1

## 2018-05-03 MED ORDER — SODIUM CHLORIDE 0.9 % IV BOLUS
1000.0000 mL | Freq: Once | INTRAVENOUS | Status: AC
  Administered 2018-05-03: 1000 mL via INTRAVENOUS

## 2018-05-03 MED ORDER — DIPHENHYDRAMINE HCL 25 MG PO TABS: 25.0000 mg | ORAL_TABLET | Freq: Four times a day (QID) | ORAL | 0 refills | Status: DC

## 2018-05-03 MED ORDER — PREDNISONE 20 MG PO TABS: 40.0000 mg | ORAL_TABLET | Freq: Every day | ORAL | 0 refills | Status: DC

## 2018-05-03 MED ORDER — METHYLPREDNISOLONE SODIUM SUCC 125 MG IJ SOLR
INTRAMUSCULAR | Status: AC
  Administered 2018-05-03: 125 mg
  Filled 2018-05-03: qty 2

## 2018-05-03 MED ORDER — FAMOTIDINE IN NACL 20-0.9 MG/50ML-% IV SOLN
INTRAVENOUS | Status: AC
  Administered 2018-05-03: 20 mg
  Filled 2018-05-03: qty 50

## 2018-05-03 NOTE — ED Provider Notes (Signed)
[facility] Emergency Department Provider Note  ____________________________________________  Time seen: Approximately 5:00 PM  I have reviewed the triage vital signs and the nursing notes.   HISTORY  Chief Complaint Allergic Reaction    HPI Andres [name] is a 52 y.o. male with a history of gout, and intellectual disability, mood disorder is brought to the ED due to a rash that then present for the past 2 days. denies tongue or throat swelling, denies cough or shortness of breath. No wheezing. No fever or chills diarrhea black or bloody stool. No vomiting.no mouth or throat pain or difficulty swallowing.  Patient saw his doctor yesterday, was told to stop Lamictal which is been on for greater than one month as well as another medicine that is been on for 1 week due to suspected allergic reaction. He sent him to the ED for further evaluation today due to persistence of symptoms..       Past Medical History:  Diagnosis Date  . Depression      Patient Active Problem List   Diagnosis Date Noted  . Subclinical hypothyroidism 02/28/2018  . Vitamin D deficiency, unspecified 02/28/2018  . Physical retardation 01/18/2018  . Mental retardation 01/18/2018  . Development delay 01/18/2018  . 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 Localized edema 09/26/2015  . Lack of expected normal physiological development 05/09/2015  . FHx: colon cancer 05/09/2015  . Essential hypertension 05/09/2015  . 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 Tibia/fibula fracture 08/21/2013  . Seasonal allergies 08/21/2013  . Allergic rhinitis 08/21/2013  . Abscess of skin and subcutaneous tissue 08/21/2013  . Cellulitis 08/21/2013  . Psychosis (HCC) 05/05/2004     Past Surgical History:  Procedure Laterality Date  . ANKLE SURGERY    . COLON SURGERY    . HERNIA REPAIR    . TONSILLECTOMY       Prior to Admission medications   Medication Sig Start Date End Date Taking?  Authorizing Provider  allopurinol (ZYLOPRIM) 300 MG tablet Take 300 mg by mouth daily.    [provider]  diphenhydrAMINE (BENADRYL) 25 MG tablet Take 1 tablet (25 mg total) by mouth every 6 (six) hours for 4 days. 05/03/18 05/07/18  Sharman Cheek, MD  fexofenadine (ALLEGRA) 180 MG tablet Take 180 mg by mouth.    [provider]  indomethacin (INDOCIN) 50 MG capsule Take by mouth.    [provider]  predniSONE (DELTASONE) 20 MG tablet Take 2 tablets (40 mg total) by mouth daily. 05/03/18   Sharman Cheek, MD  propranolol (INDERAL) 40 MG tablet Take 40 mg by mouth 2 (two) times daily.    [provider]  QUEtiapine (SEROQUEL) 25 MG tablet Take 1 tablet (25 mg total) by mouth at bedtime. 02/24/18   Jomarie Longs, MD  traZODone (DESYREL) 50 MG tablet Take 0.5 tablets (25 mg total) by mouth at bedtime as needed for sleep. 04/25/18   Jomarie Longs, MD     Allergies Lamotrigine   Family History  Problem Relation Age of Onset  . Colon cancer Other   . Mental illness Neg Hx     Social History Social History   Tobacco Use  . Smoking status: Never Smoker  . Smokeless tobacco: Never Used  Substance Use Topics  . Alcohol use: No    Frequency: Never  . Drug use: No    Review of Systems  Constitutional:   No fever or chills.  ENT:   No sore throat. No rhinorrhea. Cardiovascular:  No chest pain or syncope. Respiratory:   No dyspnea or cough. Gastrointestinal:   Negative for abdominal pain, vomiting and diarrhea.  Musculoskeletal:   Negative for focal pain or swelling All other systems reviewed and are negative except as documented above in ROS and HPI.  ____________________________________________   PHYSICAL EXAM:  VITAL SIGNS: ED Triage Vitals  Enc Vitals Group     BP 05/03/18 1222 127/90     Pulse Rate 05/03/18 1222 (!) 117     Resp 05/03/18 1222 18     Temp 05/03/18 1222 98.3 F (36.8 C)     Temp Source 05/03/18 1222 Oral      SpO2 05/03/18 1222 100 %     Weight 05/03/18 1223 171 lb (77.6 kg)     Height 05/03/18 1223 5\' 4"  (1.626 m)     Head Circumference --      Peak Flow --      Pain Score 05/03/18 1223 0     Pain Loc --      Pain Edu? --      Excl. in [place]? --     Vital signs reviewed, nursing assessments reviewed.   Constitutional:   Alert and oriented. Well appearing and in no distress. Eyes:   Conjunctivae are normal. EOMI. PERRL. ENT      Head:   Normocephalic and atraumatic.      Nose:   No congestion/rhinnorhea.       Mouth/Throat:   MMM, no pharyngeal erythema. No peritonsillar mass. no lip or mucosal ulcerations or lesions. No tongue swelling. No uvula      Neck:   No meningismus. Full ROM. Hematological/Lymphatic/Immunilogical:   No cervical lymphadenopathy. Cardiovascular:   RRR. Symmetric bilateral radial and DP pulses.  No murmurs.  Respiratory:   Normal respiratory effort without tachypnea/retractions. Breath sounds are clear and equal bilaterally. No wheezes/rales/rhonchi. Gastrointestinal:   Soft and nontender. Non distended. There is no CVA tenderness.  No rebound, rigidity, or guarding. Genitourinary:   deferred Musculoskeletal:   Normal range of motion in all extremities. No joint effusions.  No lower extremity tenderness.  No edema. Neurologic:   Normal speech and language.  Motor grossly intact. No acute focal neurologic deficits are appreciated.  Skin:    Skin is warm, dry and intact.there is a rash over much of the trunk and extremities consistent with urticaria versus erythema multiforme. Nikolsky negative, no dermatographia. No petechia purpura or bullae. No ulcerations. ____________________________________________    LABS (pertinent positives/negatives) (all labs ordered are listed, but only abnormal results are displayed) Labs Reviewed  CBC WITH DIFFERENTIAL/PLATELET - Abnormal; Notable for the following components:      Result Value   Neutro Abs 6.7 (*)    All other  components within normal limits  COMPREHENSIVE METABOLIC PANEL - Abnormal; Notable for the following components:   CO2 20 (*)    Glucose, Bld 119 (*)    BUN 30 (*)    Calcium 8.7 (*)    ALT 13 (*)    All other components within normal limits  CK  SEDIMENTATION RATE   ____________________________________________   EKG    ____________________________________________    RADIOLOGY  No results found.  ____________________________________________   PROCEDURES Procedures  ____________________________________________  DIFFERENTIAL DIAGNOSIS   allergic reaction without anaphylaxis, erythema multiforme.  doubt dress, SJS, GEN, RMSF, L[place].  CLINICAL IMPRESSION / ASSESSMENT AND PLAN / ED COURSE  Pertinent labs & imaging results that were available during my care of the patient were  reviewed by me and considered in my medical decision making (see chart for details).      Clinical Course as of May 04 1827  Wed May 03, 2018  1507 Rash c/w urticaria. No intraoral / mucosal lesions. Doubt SJS or vasculitis. Give iv benadryl. Check labs.    [PS]  1620 Labs unremarkable.   [PS]  1723 VSS, normal. Rash improving. Will continue to observe in ED for further improvement. Results d/w family.    [PS]    Clinical Course User Index [PS] Carrie Mew, MD     ----------------------------------------- 6:28 PM on 05/03/2018 -----------------------------------------  Rash improved, much more faint, no spreading. No bullae. No new features. Vital signs are stable, patient is calm and comfortable.  I will discharge home on Benadryl and prednisone, follow up with psychiatry in one week as scheduled, return to ED sooner if condition worsens.discharge home with family.  ____________________________________________   FINAL CLINICAL IMPRESSION(S) / ED DIAGNOSES    Final diagnoses:  Allergic reaction to drug, initial encounter     ED Discharge Orders        Ordered     predniSONE (DELTASONE) 20 MG tablet  Daily     05/03/18 1827    diphenhydrAMINE (BENADRYL) 25 MG tablet  Every 6 hours     05/03/18 1827      Portions of this note were generated with dragon dictation software. Dictation errors may occur despite best attempts at proofreading.    Carrie Mew, MD 05/03/18 [phone]

## 2018-05-03 NOTE — ED Triage Notes (Signed)
Pt comes into the eD via POV c/o allergic reaction to his lamotrigine.  Patient has generalized rash on his body.  No breathing difficulties noted or swelling.  Patient in NAD at this time.

## 2018-05-03 NOTE — ED Notes (Addendum)
Pt to the er for hives. Pt has been taking lamictal for 1 month and start in???? Increase 2 weeks ago. Pt now has pruritis and hives covering his arms and trunk. No SOB noted. Family at bedside.

## 2018-05-03 NOTE — Progress Notes (Signed)
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  Patient's family called the clinic yesterday reporting that patient developed a rash.  Patient was hence advised to either go to the emergency department if the rash is severe or come and see writer here in clinic.  Patient and family opted to come to the clinic today.  Patient evaluated today.  Patient appears to have macular rashes all over his body.  Patient also has some itching .  Pt today appears lethargic and anxious.  Per family patient stopped the Lamictal as well as Ingrezza yesterday after he was advised to do so by Clinical research associate.  Discussed with family that his rash could be due to the Lamictal which was started 2 months ago or due to [place] which is new.  Discussed with family to take patient to the emergency department so that he can be evaluated for the rash and to rule out other causes.  Patient and family agrees with plan.  Patient otherwise appears alert and oriented.  Patient denies any psychosis.   Visit Diagnosis:    ICD-10-CM   1. Adverse effects of medication, initial encounter T50.905A   2. Mild intellectual disability F70   3. Tardive dyskinesia G24.01   4. Schizoaffective disorder, depressive type (HCC) F25.1     Past Psychiatric History: Have reviewed past psychiatric history from my progress note on 03/29/2018.  Past Medical History:  Past Medical History:  Diagnosis Date  . Depression     Past Surgical History:  Procedure Laterality Date  . ANKLE SURGERY    . COLON SURGERY    . HERNIA REPAIR    . TONSILLECTOMY      Family Psychiatric History: Reviewed family psychiatric history from my  progress note on 03/29/2018.  Family History:  Family History  Problem Relation Age of Onset  . Colon cancer Other   . Mental illness Neg Hx    Substance abuse history: Denies  Social History: Lives with his parents in [place].  He has a history of mild intellectual disability.  He graduated high school.  His father is his payee.  He is on disability.  He has 3 other siblings and they are supportive. Social History   Socioeconomic History  . Marital status: Single    Spouse name: Not on file  . Number of children: 0  . Years of education: Not on file  . Highest education level: Not on file  Occupational History    Comment: disabled  Social Needs  . Financial resource strain: Not hard at all  . Food insecurity:    Worry: Never true    Inability: Never true  . Transportation needs:    Medical: Yes    Non-medical: Yes  Tobacco Use  . Smoking status: Never Smoker  . Smokeless tobacco: Never Used  Substance and Sexual Activity  . Alcohol use: No    Frequency: Never  . Drug use: No  . Sexual activity: Not Currently  Lifestyle  . Physical activity:    Days per week: 0 days    Minutes per session: 0 min  . Stress: Not at all  Relationships  . Social connections:    Talks on phone: More than three times a week    Gets together: More than three times a week    Attends religious service: Never    Active member of club or organization: No    Attends meetings of clubs or organizations: Never    Relationship status: Never married  Other Topics Concern  . Not on file  Social History Narrative  . Not on file    Allergies:  Allergies  Allergen Reactions  . Lamotrigine Rash    Metabolic Disorder Labs: No results found for: HGBA1C, MPG No results found for: PROLACTIN No results found for: CHOL, TRIG, HDL, CHOLHDL, VLDL, LDLCALC No results found for: TSH  Therapeutic Level Labs: No results found for: LITHIUM No results found for: VALPROATE No components found for:   CBMZ  Current Medications: Current Outpatient Medications  Medication Sig Dispense Refill  . allopurinol (ZYLOPRIM) 300 MG tablet Take 300 mg by mouth daily.    . fexofenadine (ALLEGRA) 180 MG tablet Take 180 mg by mouth.    . indomethacin (INDOCIN) 50 MG capsule Take by mouth.    . propranolol (INDERAL) 40 MG tablet Take 40 mg by mouth 2 (two) times daily.    . QUEtiapine (SEROQUEL) 25 MG tablet Take 1 tablet (25 mg total) by mouth at bedtime. 90 tablet 0  . traZODone (DESYREL) 50 MG tablet Take 0.5 tablets (25 mg total) by mouth at bedtime as needed for sleep. 45 tablet 2   No current facility-administered medications for this visit.      Musculoskeletal: Strength & Muscle Tone: within normal limits Gait & Station: normal Patient leans: N/A  Psychiatric Specialty Exam: Review of Systems  Skin: Positive for rash.  Psychiatric/Behavioral: Positive for depression. The patient is nervous/anxious.   All other systems reviewed and are negative.   Blood pressure (!) 134/91, pulse (!) 123, weight 171 lb 3.2 oz (77.7 kg).Body mass index is 29.39 kg/m.  General Appearance: Casual  Eye Contact:  Fair  Speech:  Normal Rate  Volume:  Decreased  Mood:  Dysphoric  Affect:  Depressed  Thought Process:  Goal Directed and Descriptions of Associations: Intact  Orientation:  Full (Time, Place, and Person)  Thought Content: Logical   Suicidal Thoughts:  No  Homicidal Thoughts:  No  Memory:  Immediate;   Fair Recent;   Fair Remote;   Fair  Judgement:  Fair  Insight:  Fair  Psychomotor Activity:  Decreased  Concentration:  Concentration: Fair and Attention Span: Fair  Recall:  [employer] of Knowledge: Fair  Language: Fair  Akathisia:  No  Handed:  Right  AIMS (if indicated): has TD  Assets:  Desire for Improvement Social Support  ADL's:  Intact  Cognition: WNL  Sleep:  Poor   Screenings: AIMS     Office Visit from 04/25/2018 in [facility]  Visit from 03/08/2018 in [facility] Visit from 02/22/2018 in [facility] Office Visit from 02/08/2018 in [facility] Visit from 01/18/2018 in [facility]  AIMS Total Score  Assessment and Plan: Aqil is a 52 year old Caucasian male with history of mild intellectual disability, mood disorder, TD, presented to the clinic today for a follow-up visit.  Patient was initially referred to the clinic by his neurologist Dr. Sherryll Burger as well as his  PMD.  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  Patient hence was taken off of some of his medications like risperidone and Depakote which he was taking previously which may have contributed to his movement disorder.  Patient was started on Lamictal in February 2019.  Patient was kept on a small dose of Lamictal 25 mg.  Patient also had Ingrezza added for TD , which was helping with his movement problems.  Patient however developed a rash yesterday and his family called the clinic.  Patient was advised to stop Lamictal which is known to cause rash as well as Ingrezza which is a new medication for him. Patient presented to the clinic today for an evaluation.  On evaluation patient has rash all over his body and he appears lethargic.  Patient also appears to be tachycardic.  Per family patient did not sleep last night.  Discussed with patient and family to go to the nearest emergency department to be evaluated and they agreed with plan.  Plan Will not make any changes with his medications today.  He was advised to stop Lamictal and Ingrezza yesterday which he has already stopped. Patient's family to take patient to the nearest emergency department for evaluation of his recent onset rash.  Follow-up in clinic in a week.  More than 50 % of the time was spent for  psychoeducation and supportive psychotherapy and care coordination.  This note was generated in part or whole with voice recognition software. Voice recognition is usually quite accurate but there are transcription errors that can and very often do occur. I apologize for any typographical errors that were not detected and corrected.       Jomarie Longs, MD 05/03/2018, 12:31 PM

## 2018-05-03 NOTE — ED Notes (Signed)
IV d/c ?

## 2018-05-10 ENCOUNTER — Other Ambulatory Visit: Payer: Self-pay

## 2018-05-10 ENCOUNTER — Ambulatory Visit: Payer: Medicare Other | Admitting: Psychiatry

## 2018-05-10 ENCOUNTER — Encounter: Payer: Self-pay | Admitting: Psychiatry

## 2018-05-10 VITALS — BP 172/108 | HR 87 | Wt 165.6 lb

## 2018-05-10 DIAGNOSIS — F251 Schizoaffective disorder, depressive type: Secondary | ICD-10-CM | POA: Diagnosis not present

## 2018-05-10 DIAGNOSIS — T50905D Adverse effect of unspecified drugs, medicaments and biological substances, subsequent encounter: Secondary | ICD-10-CM

## 2018-05-10 DIAGNOSIS — G2401 Drug induced subacute dyskinesia: Secondary | ICD-10-CM

## 2018-05-10 DIAGNOSIS — F70 Mild intellectual disabilities: Secondary | ICD-10-CM

## 2018-05-10 MED ORDER — QUETIAPINE FUMARATE 25 MG PO TABS: 12.5000 mg | ORAL_TABLET | Freq: Every day | ORAL | 2 refills | Status: DC | PRN

## 2018-05-10 MED ORDER — QUETIAPINE FUMARATE 25 MG PO TABS: 25.0000 mg | ORAL_TABLET | Freq: Every day | ORAL | 0 refills | Status: DC

## 2018-05-10 NOTE — Progress Notes (Signed)
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  Patient today appeared to be agitated and irritable.  Patient's Sister Andres [name] as well as father were present during the evaluation.  Patient's rashes resolved however he developed a blister on his lower extremity.  Received a few doses of prednisone and  Benadryl the past few days.  It is likely that the combination of his pain from the blister on his lower extremity as well as being on medications like Benadryl and prednisone recently may be affecting his mood.  Patient noted as saying I want to go to heaven during the evaluation.  However patient did not elaborate on it.  Discussed with family to monitor patient closely and if he voices any suicidal thoughts or behaviors to get immediate help.  Also discussed to take him back to his primary medical doctor as advised during the last visit with him evaluation of the blister.  Jessica CMA called patient's PMD and scheduled an appointment for 10 AM.  Patient after this appointment will go to his PMD.   Visit Diagnosis:    ICD-10-CM   1. Schizoaffective disorder, depressive type (HCC) F25.1   2. Mild intellectual disability F70   3. Adverse effects of medication, subsequent encounter T50.905D   4. Tardive dyskinesia G24.01 QUEtiapine (SEROQUEL) 25 MG tablet    Past Psychiatric History: Have reviewed past psychiatric history from my progress note on 03/29/2018.  Past Medical History:  Past Medical History:  Diagnosis Date  . Depression     Past Surgical History:  Procedure Laterality Date  . ANKLE SURGERY    . COLON SURGERY    . HERNIA  REPAIR    . TONSILLECTOMY      Family Psychiatric History: Have reviewed family psychiatric history from my progress note on 03/29/2018.  Family History:  Family History  Problem Relation Age of Onset  . Colon cancer Other   . Mental illness Neg Hx   Substance abuse history: Denies   Social History: I have reviewed social history from my progress note on 03/29/2018. Social History   Socioeconomic History  . Marital status: Single    Spouse name: Not on file  . Number of children: 0  . Years of education: Not on file  . Highest education level: Not on file  Occupational History    Comment: disabled  Social Needs  . Financial resource strain: Not hard at all  . Food insecurity:    Worry: Never true    Inability: Never true  . Transportation needs:    Medical: Yes    Non-medical: Yes  Tobacco Use  . Smoking status: Never Smoker  . Smokeless tobacco: Never Used  Substance and Sexual Activity  . Alcohol use: No    Frequency: Never  . Drug use: No  . Sexual activity: Not Currently  Lifestyle  . Physical activity:    Days per week: 0 days    Minutes per session: 0 min  . Stress: Not at all  Relationships  . Social connections:    Talks on phone: More than three times a week  Gets together: More than three times a week    Attends religious service: Never    Active member of club or organization: No    Attends meetings of clubs or organizations: Never    Relationship status: Never married  Other Topics Concern  . Not on file  Social History Narrative  . Not on file    Allergies:  Allergies  Allergen Reactions  . Lamotrigine Rash    Metabolic Disorder Labs: No results found for: HGBA1C, MPG No results found for: PROLACTIN No results found for: CHOL, TRIG, HDL, CHOLHDL, VLDL, LDLCALC No results found for: TSH  Therapeutic Level Labs: No results found for: LITHIUM No results found for: VALPROATE No components found for:  CBMZ  Current  Medications: Current Outpatient Medications  Medication Sig Dispense Refill  . allopurinol (ZYLOPRIM) 300 MG tablet Take 300 mg by mouth daily.    . fexofenadine (ALLEGRA) 180 MG tablet Take 180 mg by mouth.    . indomethacin (INDOCIN) 50 MG capsule Take by mouth.    . predniSONE (DELTASONE) 20 MG tablet Take 2 tablets (40 mg total) by mouth daily. 8 tablet 0  . propranolol (INDERAL) 40 MG tablet Take 40 mg by mouth 2 (two) times daily.    . QUEtiapine (SEROQUEL) 25 MG tablet Take 1 tablet (25 mg total) by mouth at bedtime. 90 tablet 0  . traZODone (DESYREL) 50 MG tablet Take 0.5 tablets (25 mg total) by mouth at bedtime as needed for sleep. 45 tablet 2  . diphenhydrAMINE (BENADRYL) 25 MG tablet Take 1 tablet (25 mg total) by mouth every 6 (six) hours for 4 days. 16 tablet 0  . QUEtiapine (SEROQUEL) 25 MG tablet Take 0.5 tablets (12.5 mg total) by mouth daily as needed. For severe anxiety/agitation 15 tablet 2   No current facility-administered medications for this visit.      Musculoskeletal: Strength & Muscle Tone: within normal limits Gait & Station: in wheel chair Patient leans: N/A  Psychiatric Specialty Exam: Review of Systems  Skin: Positive for rash.       Blister on right LE  Psychiatric/Behavioral: The patient is nervous/anxious.   All other systems reviewed and are negative.   Blood pressure (!) 172/108, pulse 87, weight 165 lb 9.6 oz (75.1 kg).Body mass index is 28.43 kg/m.  General Appearance: Casual  Eye Contact:  Minimal  Speech:  limited  Volume:  Normal  Mood:  Anxious and Irritable  Affect:  Congruent  Thought Process:  Goal Directed and Descriptions of Associations: Intact  Orientation:  Other:  to person, place, situation  Thought Content: Logical   Suicidal Thoughts:  did not express any  Homicidal Thoughts:  No  Memory:  Immediate;   Fair Recent;   Fair Remote;   Fair  Judgement:  Fair  Insight:  Fair  Psychomotor Activity:  Normal  Concentration:   Concentration: Fair and Attention Span: Fair  Recall:  [employer] of Knowledge: Fair  Language: Fair  Akathisia:  No  Handed:  Right  AIMS (if indicated): hx of TD , continues to have tremors   Assets:  Communication Skills Desire for Improvement Housing Social Support  ADL's:  Intact  Cognition: WNL  Sleep:  Fair   Screenings: AIMS     Office Visit from 04/25/2018 in [facility] Office Visit from 03/08/2018 in [facility] Office Visit from 02/22/2018 in [facility] Office Visit from 02/08/2018 in [facility] Office Visit  from 01/18/2018 in [facility]  AIMS Total Score  Assessment and Plan: Easton is a 52 year old Caucasian male with history of mild intellectual disability, mood disorder, TD, presented to the clinic today for a follow-up visit.  Patient was initially referred to the clinic by his neurologist Dr. Sherryll Burger as well as his PMD.  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  Patient hence was taken off of some of his medications like risperidone and Depakote which he was taking previously which may have contributed to his movement disorder.  Patient was started on Lamictal in February 2019.  Even though patient was placed on a small dose of Lamictal 25 mg patient developed rash the first week of May.  Patient was evaluated in the emergency department and treated with prednisone and Benadryl.  Patient today presented for a follow-up visit.  Even though patient's rash subsided he developed a blister on his right lower extremity.  Patient's mood also appears to be irritable and agitated today.  It is likely that his medication as well as the pain from his blister is contributing to his mood lability.  Patient's family will supervise him closely.  Will add a small dose  of Seroquel during the day as needed for severe agitation.  Pt to follow-up with his primary medical doctor today for evaluation of his blister.  Plan For schizoaffective do: Will add Seroquel 12.5 mg p.o. daily as needed for severe agitation.  Will not make any more changes today.  Patient and family will follow-up with primary medical doctor for evaluation of the rash/blister, likely due to Lamictal therapy.  Patient is no longer on Lamictal.  Also discussed crisis plan with patient as well as family.  Patient with mild intellectual disability.  Patient's family will monitor him closely for self-injurious/suicidal thoughts and get immediate help.  Follow up in clinic in 1 week or sooner if needed.  More than 50 % of the time was spent for psychoeducation and supportive psychotherapy and care coordination.  This note was generated in part or whole with voice recognition software. Voice recognition is usually quite accurate but there are transcription errors that can and very often do occur. I apologize for any typographical errors that were not detected and corrected.         Jomarie Longs, MD 05/11/2018, 8:52 AM

## 2018-05-11 ENCOUNTER — Encounter: Payer: Self-pay | Admitting: Psychiatry

## 2018-05-16 ENCOUNTER — Other Ambulatory Visit: Payer: Self-pay

## 2018-05-16 ENCOUNTER — Ambulatory Visit: Payer: Medicare Other | Admitting: Psychiatry

## 2018-05-16 ENCOUNTER — Encounter: Payer: Self-pay | Admitting: Psychiatry

## 2018-05-16 VITALS — BP 143/91 | HR 87 | Wt 163.8 lb

## 2018-05-16 DIAGNOSIS — F70 Mild intellectual disabilities: Secondary | ICD-10-CM

## 2018-05-16 DIAGNOSIS — F251 Schizoaffective disorder, depressive type: Secondary | ICD-10-CM | POA: Diagnosis not present

## 2018-05-16 DIAGNOSIS — G2401 Drug induced subacute dyskinesia: Secondary | ICD-10-CM

## 2018-05-16 DIAGNOSIS — T50905D Adverse effect of unspecified drugs, medicaments and biological substances, subsequent encounter: Secondary | ICD-10-CM | POA: Diagnosis not present

## 2018-05-16 NOTE — Patient Instructions (Signed)
Please start taking Trazodone one tablet instead of half tablet - 50 mg instead of 25 mg for sleep.

## 2018-05-16 NOTE — Progress Notes (Addendum)
BH MD OP Progress Note  05/16/2018 3:02 PM Andres [name]  MRN:  [id number]  Chief Complaint: ' I am here for follow up.' Chief Complaint    Follow-up; Medication Refill     HPI: Andres [name] is a 52 year old Caucasian male, has a history of mood disorder, drug induced movement disorder, TD, mild ID, recent adverse reaction to medication (Lamictal), presented to the clinic today for a follow-up visit.  Patient today appeared to be calm and pleasant.  Patient appeared to be smiling often.  Patient was able to participate in the evaluation and was more cooperative.  Patient's Sister Frutoso Chase as well as father were also present during the evaluation.  Patient had adverse reaction to Lamictal therapy 3 weeks ago .  Patient had prednisone as well as Benadryl prescribed by his PMD, patient was also seen in the emergency department when he initially presented with a rash.  Patient had a blister last week on his right lower extremity which is currently resolving.  Patient however developed another blister on his right foot.  Family reports this one as new onset.  Patient denies any pain from the blister.  Discussed with family to continue to follow recommendations per PMD.  Also discussed to reach out to her PMD for further recommendations if there are new blisters   Patient otherwise reports some restlessness at night.  Per family patient did not take his medications for a few days last week.  Since the last few days patient has been taking his medications as prescribed.  He is on Seroquel and trazodone now.  He sleeps for 6 hours at night.  He also takes naps during daytime.  Discussed increasing trazodone to 50 mg if he has sleep problems.  His restlessness at night could also be due to his pain and discomfort from blister. Visit Diagnosis:    ICD-10-CM   1. Schizoaffective disorder, depressive type (HCC) F25.1   2. Mild intellectual disability F70   3. Tardive dyskinesia G24.01   4. Adverse effects of  medication, subsequent encounter T50.905D     Past Psychiatric History: Have reviewed past psychiatric history from my progress note on 03/29/2018.  Past Medical History:  Past Medical History:  Diagnosis Date  . Depression     Past Surgical History:  Procedure Laterality Date  . ANKLE SURGERY    . COLON SURGERY    . HERNIA REPAIR    . TONSILLECTOMY      Family Psychiatric History: Reviewed family psychiatric history from my progress note on 03/29/2018.  Family History:  Family History  Problem Relation Age of Onset  . Colon cancer Other   . Mental illness Neg Hx    Substance abuse history: Denies  Social History: Reviewed Social history from my progress note on 03/29/2018 Social History   Socioeconomic History  . Marital status: Single    Spouse name: Not on file  . Number of children: 0  . Years of education: Not on file  . Highest education level: Not on file  Occupational History    Comment: disabled  Social Needs  . Financial resource strain: Not hard at all  . Food insecurity:    Worry: Never true    Inability: Never true  . Transportation needs:    Medical: Yes    Non-medical: Yes  Tobacco Use  . Smoking status: Never Smoker  . Smokeless tobacco: Never Used  Substance and Sexual Activity  . Alcohol use: No    Frequency: Never  .  Drug use: No  . Sexual activity: Not Currently  Lifestyle  . Physical activity:    Days per week: 0 days    Minutes per session: 0 min  . Stress: Not at all  Relationships  . Social connections:    Talks on phone: More than three times a week    Gets together: More than three times a week    Attends religious service: Never    Active member of club or organization: No    Attends meetings of clubs or organizations: Never    Relationship status: Never married  Other Topics Concern  . Not on file  Social History Narrative  . Not on file    Allergies:  Allergies  Allergen Reactions  . Lamotrigine Rash    Metabolic  Disorder Labs: No results found for: HGBA1C, MPG No results found for: PROLACTIN No results found for: CHOL, TRIG, HDL, CHOLHDL, VLDL, LDLCALC No results found for: TSH  Therapeutic Level Labs: No results found for: LITHIUM No results found for: VALPROATE No components found for:  CBMZ  Current Medications: Current Outpatient Medications  Medication Sig Dispense Refill  . allopurinol (ZYLOPRIM) 300 MG tablet Take 300 mg by mouth daily.    . fexofenadine (ALLEGRA) 180 MG tablet Take 180 mg by mouth.    . indomethacin (INDOCIN) 50 MG capsule Take by mouth.    . predniSONE (DELTASONE) 20 MG tablet Take 2 tablets (40 mg total) by mouth daily. 8 tablet 0  . propranolol (INDERAL) 40 MG tablet Take 40 mg by mouth 2 (two) times daily.    . QUEtiapine (SEROQUEL) 25 MG tablet Take 0.5 tablets (12.5 mg total) by mouth daily as needed. For severe anxiety/agitation 15 tablet 2  . QUEtiapine (SEROQUEL) 25 MG tablet Take 1 tablet (25 mg total) by mouth at bedtime. 90 tablet 0  . traZODone (DESYREL) 50 MG tablet Take 0.5 tablets (25 mg total) by mouth at bedtime as needed for sleep. 45 tablet 2  . diphenhydrAMINE (BENADRYL) 25 MG tablet Take 1 tablet (25 mg total) by mouth every 6 (six) hours for 4 days. 16 tablet 0   No current facility-administered medications for this visit.      Musculoskeletal: Strength & Muscle Tone: within normal limits Gait & Station: normal Patient leans: N/A  Psychiatric Specialty Exam: Review of Systems  Skin:       Blister - new on right foot   All other systems reviewed and are negative.   Blood pressure (!) 143/91, pulse 87, weight 163 lb 12.8 oz (74.3 kg).Body mass index is 28.12 kg/m.  General Appearance: Casual  Eye Contact:  Fair  Speech:  Clear and Coherent  Volume:  Normal  Mood:  Anxious  Affect:  Congruent  Thought Process:  Goal Directed and Descriptions of Associations: Intact  Orientation:  Other:  to pserson, self, situation  Thought  Content: Logical   Suicidal Thoughts:  No  Homicidal Thoughts:  No  Memory:  Immediate;   Fair Recent;   Fair Remote;   Fair  Judgement:  Fair  Insight:  Fair  Psychomotor Activity:  TD  Concentration:  Concentration: Fair and Attention Span: Fair  Recall:  [employer] of Knowledge: Fair  Language: Fair  Akathisia:  No  Handed:  Right  AIMS (if indicated): TD - 9  Assets:  Communication Skills Desire for Improvement Housing  ADL's:  Intact  Cognition: WNL  Sleep:  restless at times   Screenings: AIMS  Office Visit from 05/16/2018 in [facility] Office Visit from 04/25/2018 in [facility] Office Visit from 03/08/2018 in [facility] Office Visit from 02/22/2018 in [facility] Office Visit from 02/08/2018 in [facility]  AIMS Total Score  Assessment and Plan: Diyari is a 52 year old Caucasian male with history of mild intellectual disability, mood disorder, TD, presented to the clinic today for a follow-up visit.  Patient was initially referred to the clinic by his neurologist Dr. Sherryll Burger as well as his PMD.  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  Patient hence was taken off some of his medications like risperidone, Depakote which he was taking previously which may have contributed to his movement disorder.  Patient was started on Lamictal in February 2019.  Patient however developed side effects to the Lamictal in May 2019.  Patient required evaluation in the emergency department as well as follow-ups with his PMD for management of his rash.  Patient today appears to be better mood wise.  Patient however continues to have a new onset blister.  Patient had one blister last week which is healing and has another blister on his right foot which is new onset.  Discussed  with family to contact PMD for further recommendations.  Plan as noted below.  Plan Schizoaffective disorder Continue Seroquel 12.5 mg daily as needed for severe agitation Continue Seroquel 25 mg p.o. nightly Discussed with patient as well as family to increase trazodone from 25-50 mg if he continues to have restlessness at night.  Adverse reaction to Lamictal Family advised to follow-up with PMD management.   TD Aims equals 9 Patient was started on Ingrezza which was discontinued when he developed a rash. Will not restart Ingrezza  yet.  We will monitor his rash closely and once its resolve can reinitiate ingrezza at that point.  Follow up in clinic in 2 weeks or sooner if needed.  More than 50 % of the time was spent for psychoeducation and supportive psychotherapy and care coordination.  This note was generated in part or whole with voice recognition software. Voice recognition is usually quite accurate but there are transcription errors that can and very often do occur. I apologize for any typographical errors that were not detected and corrected.       Jomarie Longs, MD 05/17/2018, 8:56 AM

## 2018-05-17 ENCOUNTER — Encounter: Payer: Self-pay | Admitting: Psychiatry

## 2018-05-31 ENCOUNTER — Other Ambulatory Visit: Payer: Self-pay

## 2018-05-31 ENCOUNTER — Encounter: Payer: Self-pay | Admitting: Psychiatry

## 2018-05-31 ENCOUNTER — Ambulatory Visit: Payer: Medicare Other | Admitting: Psychiatry

## 2018-05-31 VITALS — BP 129/86 | HR 96 | Wt 159.8 lb

## 2018-05-31 DIAGNOSIS — F251 Schizoaffective disorder, depressive type: Secondary | ICD-10-CM

## 2018-05-31 DIAGNOSIS — F70 Mild intellectual disabilities: Secondary | ICD-10-CM | POA: Diagnosis not present

## 2018-05-31 DIAGNOSIS — T50905D Adverse effect of unspecified drugs, medicaments and biological substances, subsequent encounter: Secondary | ICD-10-CM

## 2018-05-31 DIAGNOSIS — G2401 Drug induced subacute dyskinesia: Secondary | ICD-10-CM

## 2018-05-31 NOTE — Progress Notes (Signed)
BH MD  OP Progress Note  05/31/2018 12:42 PM Andres [name]  MRN:  [id number]  Chief Complaint: ' I am here for follow up." Chief Complaint    Follow-up; Medication Refill     HPI: Andres [name] is a 52 yr old Caucasian male who has a history of mood disorder, drug induced movement disorder, TD, mild ID, recent adverse reaction to medication (Lamictal), presented to the clinic today for a follow-up visit.   Patient today appeared to be pleasant and cheerful.  Patient was able to answer all questions appropriately.  Patient's blisters from the Lamictal are healing well.  Patient does not have any new onset blisters or rash at this time.  Patient continues to have trouble staying compliant with his medications.  However he is trying to take it every night however there are nights when he skips doses or falls asleep without taking his medications.  Some time was spent discussing this with patient.  Provided education about medication compliance.  Patient and family agrees to get an alarm clock to set reminders.  Patient reports his sleep is improved.  He sleeps well 4-5 days a week and then he has a day or 2 when he cannot sleep well.  He takes the Seroquel and the trazodone as needed.  Both these medications have been helpful with his sleep and his mood.  He denies any side effects to the medication.  He continues to have some movements issues of his hands, bilateral.  However the movement problems have improved and is much better than what he had initially when he came to the clinic.  He is currently not on Ingrezza .  Ingrezza was stopped when he developed a blister to the Lamictal since he was just initiated on it .  He has not started back the medication yet.  Patient report his appetite is fair.  Patient denies any perceptual disturbances at this time.  Per [name]-sister patient had an episode last week when he felt his sister was 'mean " and 'not her usual self.Marland Kitchen  His mood was also kind of irritable  at that time.  However he was coming off of prednisone and medication like Benadryl at that time which could have contributed to that episode.  Patient this week has not had any mood symptoms or perceptual disturbances.  Family will monitor him closely. Visit Diagnosis:    ICD-10-CM   1. Schizoaffective disorder, depressive type (HCC) F25.1   2. Mild intellectual disability F70   3. Tardive dyskinesia G24.01   4. Adverse effects of medication, subsequent encounter T50.905D     Past Psychiatric History: I have reviewed past psychiatric history from my progress note on 03/29/2018  Past Medical History:  Past Medical History:  Diagnosis Date  . Depression     Past Surgical History:  Procedure Laterality Date  . ANKLE SURGERY    . COLON SURGERY    . HERNIA REPAIR    . TONSILLECTOMY      Family Psychiatric History: Reviewed family psychiatric history from my progress note on 03/29/2018.  Family History:  Family History  Problem Relation Age of Onset  . Colon cancer Other   . Mental illness Neg Hx   Substance abuse history: Denies   Social History: Reviewed social history from my progress note on 03/29/2018. Social History   Socioeconomic History  . Marital status: Single    Spouse name: Not on file  . Number of children: 0  . Years of education: Not  on file  . Highest education level: Not on file  Occupational History    Comment: disabled  Social Needs  . Financial resource strain: Not hard at all  . Food insecurity:    Worry: Never true    Inability: Never true  . Transportation needs:    Medical: Yes    Non-medical: Yes  Tobacco Use  . Smoking status: Never Smoker  . Smokeless tobacco: Never Used  Substance and Sexual Activity  . Alcohol use: No    Frequency: Never  . Drug use: No  . Sexual activity: Not Currently  Lifestyle  . Physical activity:    Days per week: 0 days    Minutes per session: 0 min  . Stress: Not at all  Relationships  . Social  connections:    Talks on phone: More than three times a week    Gets together: More than three times a week    Attends religious service: Never    Active member of club or organization: No    Attends meetings of clubs or organizations: Never    Relationship status: Never married  Other Topics Concern  . Not on file  Social History Narrative  . Not on file    Allergies:  Allergies  Allergen Reactions  . Lamotrigine Rash    Metabolic Disorder Labs: No results found for: HGBA1C, MPG No results found for: PROLACTIN No results found for: CHOL, TRIG, HDL, CHOLHDL, VLDL, LDLCALC No results found for: TSH  Therapeutic Level Labs: No results found for: LITHIUM No results found for: VALPROATE No components found for:  CBMZ  Current Medications: Current Outpatient Medications  Medication Sig Dispense Refill  . allopurinol (ZYLOPRIM) 300 MG tablet Take 300 mg by mouth daily.    . fexofenadine (ALLEGRA) 180 MG tablet Take 180 mg by mouth.    . indomethacin (INDOCIN) 50 MG capsule Take by mouth.    . predniSONE (DELTASONE) 20 MG tablet Take 2 tablets (40 mg total) by mouth daily. 8 tablet 0  . QUEtiapine (SEROQUEL) 25 MG tablet Take 0.5 tablets (12.5 mg total) by mouth daily as needed. For severe anxiety/agitation 15 tablet 2  . QUEtiapine (SEROQUEL) 25 MG tablet Take 1 tablet (25 mg total) by mouth at bedtime. 90 tablet 0  . traZODone (DESYREL) 50 MG tablet Take 0.5 tablets (25 mg total) by mouth at bedtime as needed for sleep. 45 tablet 2  . diphenhydrAMINE (BENADRYL) 25 MG tablet Take 1 tablet (25 mg total) by mouth every 6 (six) hours for 4 days. 16 tablet 0   No current facility-administered medications for this visit.      Musculoskeletal: Strength & Muscle Tone: within normal limits Gait & Station: normal Patient leans: N/A  Psychiatric Specialty Exam: Review of Systems  Psychiatric/Behavioral: The patient is nervous/anxious.   All other systems reviewed and are  negative.   Blood pressure 129/86, pulse 96, weight 159 lb 12.8 oz (72.5 kg).Body mass index is 27.43 kg/m.  General Appearance: Casual  Eye Contact:  Fair  Speech:  Normal Rate  Volume:  Normal  Mood:  Euthymic  Affect:  Congruent  Thought Process:  Goal Directed and Descriptions of Associations: Intact  Orientation:  Other:  place, person  Thought Content: Logical   Suicidal Thoughts:  No  Homicidal Thoughts:  No  Memory:  Immediate;   Fair Recent;   Fair Remote;   Fair  Judgement:  Fair  Insight:  Fair  Psychomotor Activity:  Normal  Concentration:  Concentration: Fair and Attention Span: Fair  Recall:  [employer] of Knowledge: Fair  Language: Fair  Akathisia:  No  Handed:  Right  AIMS (if indicated): 8 , has TD  Assets:  Communication Skills Desire for Improvement Housing Social Support  ADL's:  Intact  Cognition: WNL  Sleep:  varies   Screenings: AIMS     Office Visit from 05/31/2018 in [facility] Office Visit from 05/16/2018 in [facility] Office Visit from 04/25/2018 in [facility] Office Visit from 03/08/2018 in [facility] Office Visit from 02/22/2018 in [facility]  AIMS Total Score  8  9  6  9  8        Assessment and Plan: Lenwood is a 52 year old Caucasian male with history of mild intellectual disability, mood disorder, TD, presented to the clinic today for a follow-up visit.  Patient was initially referred to the clinic by his neurologist Dr. Sherryll Burger as well as his PMD.  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  Patient hence was taken off some of his medications like risperidone, Depakote which he was taking previously which may have contributed to his movement disorder.  Patient was started on Lamictal in February 2019.  Patient however developed side effects  to Lamictal in May 2019.  Patient required evaluation in the emergency department as well as follow-ups with his PMD for management of his rash.  Patient today presented and on evaluation is noted as having no new onset rash or blisters.  His old blisters are healing.  Patient appears to be more pleasant and does not appear to have any irritability today.  Discussed plan as noted below.  Plan Schizoaffective disorder Continue Seroquel 12.5 mg daily as needed for severe agitation Continue Seroquel 25 mg p.o. nightly Continue trazodone 25-50 mg p.o. nightly as needed Patient provided education about staying compliant on his medication.  TD Aims equals 8 Patient was on Ingrezza in the past , however it was discontinued after 1-2 weeks of initiating it since patient developed the rash due to the Lamictal.  Patient has not been restarted on it yet.  We will monitor closely and reassess.  Adverse reaction to Lamictal Patient noted as improving.  He does not have any new onset blisters and his old blisters are healing.  Follow-up in clinic in 1 month or sooner if needed.  More than 50 % of the time was spent for psychoeducation and supportive psychotherapy and care coordination.  This note was generated in part or whole with voice recognition software. Voice recognition is usually quite accurate but there are transcription errors that can and very often do occur. I apologize for any typographical errors that were not detected and corrected.       Jomarie Longs, MD 05/31/2018, 12:42 PM

## 2018-06-09 DIAGNOSIS — R634 Abnormal weight loss: Secondary | ICD-10-CM | POA: Insufficient documentation

## 2018-06-20 ENCOUNTER — Ambulatory Visit: Payer: Medicare Other | Admitting: Psychiatry

## 2018-06-28 ENCOUNTER — Other Ambulatory Visit: Payer: Self-pay

## 2018-06-28 ENCOUNTER — Encounter: Payer: Self-pay | Admitting: Psychiatry

## 2018-06-28 ENCOUNTER — Ambulatory Visit: Payer: Medicare Other | Admitting: Psychiatry

## 2018-06-28 VITALS — BP 130/90 | HR 92 | Temp 97.8°F | Wt 158.6 lb

## 2018-06-28 DIAGNOSIS — F251 Schizoaffective disorder, depressive type: Secondary | ICD-10-CM | POA: Diagnosis not present

## 2018-06-28 DIAGNOSIS — F70 Mild intellectual disabilities: Secondary | ICD-10-CM | POA: Diagnosis not present

## 2018-06-28 DIAGNOSIS — G2401 Drug induced subacute dyskinesia: Secondary | ICD-10-CM | POA: Diagnosis not present

## 2018-06-28 MED ORDER — QUETIAPINE FUMARATE 25 MG PO TABS: 25.0000 mg | ORAL_TABLET | Freq: Every day | ORAL | 0 refills | Status: DC

## 2018-06-28 MED ORDER — CITALOPRAM HYDROBROMIDE 10 MG PO TABS: 5.0000 mg | ORAL_TABLET | Freq: Every day | ORAL | 1 refills | Status: DC

## 2018-06-28 NOTE — Patient Instructions (Signed)
Citalopram tablets What is this medicine? CITALOPRAM (sye TAL oh pram) is a medicine for depression. This medicine may be used for other purposes; ask your health care provider or pharmacist if you have questions. COMMON BRAND NAME(S): Celexa What should I tell my health care provider before I take this medicine? They need to know if you have any of these conditions: -bleeding disorders -bipolar disorder or a family history of bipolar disorder -glaucoma -heart disease -history of irregular heartbeat -kidney disease -liver disease -low levels of magnesium or potassium in the blood -receiving electroconvulsive therapy -seizures -suicidal thoughts, plans, or attempt; a previous suicide attempt by you or a family member -take medicines that treat or prevent blood clots -thyroid disease -an unusual or allergic reaction to citalopram, escitalopram, other medicines, foods, dyes, or preservatives -pregnant or trying to become pregnant -breast-feeding How should I use this medicine? Take this medicine by mouth with a glass of water. Follow the directions on the prescription label. You can take it with or without food. Take your medicine at regular intervals. Do not take your medicine more often than directed. Do not stop taking this medicine suddenly except upon the advice of your doctor. Stopping this medicine too quickly may cause serious side effects or your condition may worsen. A special MedGuide will be given to you by the pharmacist with each prescription and refill. Be sure to read this information carefully each time. Talk to your pediatrician regarding the use of this medicine in children. Special care may be needed. Patients over 54 years old may have a stronger reaction and need a smaller dose. Overdosage: If you think you have taken too much of this medicine contact a poison control center or emergency room at once. NOTE: This medicine is only for you. Do not share this medicine with  others. What if I miss a dose? If you miss a dose, take it as soon as you can. If it is almost time for your next dose, take only that dose. Do not take double or extra doses. What may interact with this medicine? Do not take this medicine with any of the following medications: -certain medicines for fungal infections like fluconazole, itraconazole, ketoconazole, posaconazole, voriconazole -cisapride -dofetilide -dronedarone -escitalopram -linezolid -MAOIs like Carbex, Eldepryl, Marplan, Nardil, and Parnate -methylene blue (injected into a vein) -pimozide -thioridazine -ziprasidone This medicine may also interact with the following medications: -alcohol -amphetamines -aspirin and aspirin-like medicines -carbamazepine -certain medicines for depression, anxiety, or psychotic disturbances -certain medicines for infections like chloroquine, clarithromycin, erythromycin, furazolidone, isoniazid, pentamidine -certain medicines for migraine headaches like almotriptan, eletriptan, frovatriptan, naratriptan, rizatriptan, sumatriptan, zolmitriptan -certain medicines for sleep -certain medicines that treat or prevent blood clots like dalteparin, enoxaparin, warfarin -cimetidine -diuretics -fentanyl -lithium -methadone -metoprolol -NSAIDs, medicines for pain and inflammation, like ibuprofen or naproxen -omeprazole -other medicines that prolong the QT interval (cause an abnormal heart rhythm) -procarbazine -rasagiline -supplements like St. John's wort, kava kava, valerian -tramadol -tryptophan This list may not describe all possible interactions. Give your health care provider a list of all the medicines, herbs, non-prescription drugs, or dietary supplements you use. Also tell them if you smoke, drink alcohol, or use illegal drugs. Some items may interact with your medicine. What should I watch for while using this medicine? Tell your doctor if your symptoms do not get better or if they  get worse. Visit your doctor or health care professional for regular checks on your progress. Because it may take several weeks to see the full   effects of this medicine, it is important to continue your treatment as prescribed by your doctor. Patients and their families should watch out for new or worsening thoughts of suicide or depression. Also watch out for sudden changes in feelings such as feeling anxious, agitated, panicky, irritable, hostile, aggressive, impulsive, severely restless, overly excited and hyperactive, or not being able to sleep. If this happens, especially at the beginning of treatment or after a change in dose, call your health care professional. You may get drowsy or dizzy. Do not drive, use machinery, or do anything that needs mental alertness until you know how this medicine affects you. Do not stand or sit up quickly, especially if you are an older patient. This reduces the risk of dizzy or fainting spells. Alcohol may interfere with the effect of this medicine. Avoid alcoholic drinks. Your mouth may get dry. Chewing sugarless gum or sucking hard candy, and drinking plenty of water will help. Contact your doctor if the problem does not go away or is severe. What side effects may I notice from receiving this medicine? Side effects that you should report to your doctor or health care professional as soon as possible: -allergic reactions like skin rash, itching or hives, swelling of the face, lips, or tongue -anxious -black, tarry stools -breathing problems -changes in vision -chest pain -confusion -elevated mood, decreased need for sleep, racing thoughts, impulsive behavior -eye pain -fast, irregular heartbeat -feeling faint or lightheaded, falls -feeling agitated, angry, or irritable -hallucination, loss of contact with reality -loss of balance or coordination -loss of memory -painful or prolonged erections -restlessness, pacing, inability to keep  still -seizures -stiff muscles -suicidal thoughts or other mood changes -trouble sleeping -unusual bleeding or bruising -unusually weak or tired -vomiting Side effects that usually do not require medical attention (report to your doctor or health care professional if they continue or are bothersome): -change in appetite or weight -change in sex drive or performance -dizziness -headache -increased sweating -indigestion, nausea -tremors This list may not describe all possible side effects. Call your doctor for medical advice about side effects. You may report side effects to FDA at 1-800-FDA-1088. Where should I keep my medicine? Keep out of reach of children. Store at room temperature between 15 and 30 degrees C (59 and 86 degrees F). Throw away any unused medicine after the expiration date. NOTE: This sheet is a summary. It may not cover all possible information. If you have questions about this medicine, talk to your doctor, pharmacist, or health care provider.  2018 Elsevier/Gold Standard (2016-05-17 13:18:52)  

## 2018-06-28 NOTE — Progress Notes (Signed)
BH MD OP Progress Note  06/28/2018 5:16 PM Andres [name]  MRN:  [id number]  Chief Complaint: ' I am here for follow up." Chief Complaint    Follow-up; Medication Refill     HPI: Andres [name] is a 52 year old Caucasian male who has a history of mood disorder, drug induced movement disorder, TD, mild ID, recent adverse reaction to medication/Lamictal, presented to the clinic today for a follow-up visit.  Patient today presented along with his Sister Andres [name] who provided collateral information.  Andres [name] spoke to Clinical research associatewriter initially by herself.  She provided information about how patient has been having some periods of confusion.  She reports recently he was reported to her that he has not seen Clinical research associatewriter in a very long time.  Patient has seen Clinical research associatewriter several times in the past few months and did not recollect it.  Per sister he otherwise recognizes family members and is able to give the right answers to questions asked  Patient however today appeared to be alert and oriented.  Patient understands its July 3 since July 4 th is coming up tomorrow.  He was able to state what city he is in-[place].  With some prompting he was able to state the president of [place] as Andres [name]..  Patient denies any suicidality.  Patient denies any perceptual disturbances.  Patient does appear to have some trouble with the flow of speech today.  His speech seems to be nonspontaneous and he needed questions to be repeated sometimes.  Not sure if this is due to anxiety.  Discussed adding an antidepressant/antianxiety agent.  Also discussed with Sister Andres [name] to supervise how he takes his medications on a regular basis and make sure he takes the right medications at the right time.  Patient reports some sleep problems on and off.  He is on Seroquel as well as trazodone at bedtime for sleep.  Will not make any readjustment with the same today. Visit Diagnosis:    ICD-10-CM   1. Schizoaffective disorder,  depressive type (HCC) F25.1 citalopram (CELEXA) 10 MG tablet  2. Tardive dyskinesia G24.01 QUEtiapine (SEROQUEL) 25 MG tablet  3. Mild intellectual disability F70     Past Psychiatric History: I have reviewed past psychiatric history from my progress note on 03/29/2018  Past Medical History:  Past Medical History:  Diagnosis Date  . Depression     Past Surgical History:  Procedure Laterality Date  . ANKLE SURGERY    . COLON SURGERY    . HERNIA REPAIR    . TONSILLECTOMY      Family Psychiatric History: Have reviewed family psychiatric history from my progress note on 03/29/2018.  Family History:  Family History  Problem Relation Age of Onset  . Colon cancer Other   . Mental illness Neg Hx    Substance abuse history: Denies  Social History: I have reviewed social history from my progress note on 03/29/2018. Social History   Socioeconomic History  . Marital status: Single    Spouse name: Not on file  . Number of children: 0  . Years of education: Not on file  . Highest education level: Not on file  Occupational History    Comment: disabled  Social Needs  . Financial resource strain: Not hard at all  . Food insecurity:    Worry: Never true    Inability: Never true  . Transportation needs:    Medical: Yes    Non-medical: Yes  Tobacco Use  . Smoking  status: Never Smoker  . Smokeless tobacco: Never Used  Substance and Sexual Activity  . Alcohol use: No    Frequency: Never  . Drug use: No  . Sexual activity: Not Currently  Lifestyle  . Physical activity:    Days per week: 0 days    Minutes per session: 0 min  . Stress: Not at all  Relationships  . Social connections:    Talks on phone: More than three times a week    Gets together: More than three times a week    Attends religious service: Never    Active member of club or organization: No    Attends meetings of clubs or organizations: Never    Relationship status: Never married  Other Topics Concern  . Not  on file  Social History Narrative  . Not on file    Allergies:  Allergies  Allergen Reactions  . Lamotrigine Rash    Metabolic Disorder Labs: No results found for: HGBA1C, MPG No results found for: PROLACTIN No results found for: CHOL, TRIG, HDL, CHOLHDL, VLDL, LDLCALC No results found for: TSH  Therapeutic Level Labs: No results found for: LITHIUM No results found for: VALPROATE No components found for:  CBMZ  Current Medications: Current Outpatient Medications  Medication Sig Dispense Refill  . allopurinol (ZYLOPRIM) 300 MG tablet Take 300 mg by mouth daily.    . citalopram (CELEXA) 10 MG tablet Take 0.5 tablets (5 mg total) by mouth daily with breakfast. 45 tablet 1  . diphenhydrAMINE (BENADRYL) 25 MG tablet Take 1 tablet (25 mg total) by mouth every 6 (six) hours for 4 days. 16 tablet 0  . fexofenadine (ALLEGRA) 180 MG tablet Take 180 mg by mouth.    . indomethacin (INDOCIN) 50 MG capsule Take by mouth.    . predniSONE (DELTASONE) 20 MG tablet Take 2 tablets (40 mg total) by mouth daily. 8 tablet 0  . QUEtiapine (SEROQUEL) 25 MG tablet Take 0.5 tablets (12.5 mg total) by mouth daily as needed. For severe anxiety/agitation 15 tablet 2  . QUEtiapine (SEROQUEL) 25 MG tablet Take 1 tablet (25 mg total) by mouth at bedtime. 90 tablet 0  . traZODone (DESYREL) 50 MG tablet Take 0.5 tablets (25 mg total) by mouth at bedtime as needed for sleep. 45 tablet 2   No current facility-administered medications for this visit.      Musculoskeletal: Strength & Muscle Tone: within normal limits Gait & Station: normal Patient leans: N/A  Psychiatric Specialty Exam: Review of Systems  Psychiatric/Behavioral: The patient is nervous/anxious and has insomnia.   All other systems reviewed and are negative.   Blood pressure 130/90, pulse 92, temperature 97.8 F (36.6 C), temperature source Oral, weight 158 lb 9.6 oz (71.9 kg).Body mass index is 27.22 kg/m.  General Appearance: Casual   Eye Contact:  Fair  Speech:  Clear and Coherent  Volume:  not spontaneous , likely anxious  Mood:  Anxious  Affect:  Congruent  Thought Process:  Goal Directed and Descriptions of Associations: Intact  Orientation:  Full (Time, Place, and Person)  Thought Content: Logical   Suicidal Thoughts:  No  Homicidal Thoughts:  No  Memory:  Immediate;   Fair Recent;   Fair Remote;   Fair  Judgement:  Fair  Insight:  Fair  Psychomotor Activity:  Tremor  Concentration:  Concentration: Fair and Attention Span: Fair  Recall:  [employer] of Knowledge: Fair  Language: Fair  Akathisia:  No  Handed:  Right  AIMS (if indicated): does have abnormal movements of BL arms/hands , improving - has TD  Assets:  Communication Skills Desire for Improvement Social Support  ADL's:  Intact  Cognition: WNL  Sleep:  restless   Screenings: AIMS     Office Visit from 05/31/2018 in [facility] Visit from 05/16/2018 in [facility] Office Visit from 04[facility]25/2018 in [facility] Office Visit from 03/08/2018 in [facility] Office Visit from 02/22/2018 in [facility]  AIMS Total Score  8  9  6  9  8        Assessment and Plan: Kylen is a 52 year old Caucasian male with history of mild intellectual disability, mood disorder, TD, presented to the clinic today for a follow-up visit.  Patient was initially referred to the clinic by neurologist Dr. Sherryll Burger as well as his PMD.  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  Patient hence was taken off of some of his medications like risperidone, Depakote.  His movement problems improved with the change.Ingrezza was tried , but he developed a rash due to Lamictal , and hence Ingrezza and Lamictal were stopped since both medications were new. Patient today appears to be mildly  anxious and per collateral information from family does have some periods of possible confusion. On examination today he seemed to be alert oriented.  We will continue to monitor him closely.  Plan Schizoaffective disorder Continue Seroquel 12.5 mg daily as needed for severe agitation Continue Seroquel 25 mg p.o. Nightly Continue trazodone 25-50 mg p.o. nightly as needed. Add Celexa 5 mg p.o. daily.   For TD Will not add any medications.  Discussed with family to monitor closely his medications to make sure he is taking his medications as prescribed.  Follow-up in clinic in 1 month.  More than 50 % of the time was spent for psychoeducation and supportive psychotherapy and care coordination. Marland Kitchentsderag        Jomarie Longs, MD 06/28/2018, 5:16 PM

## 2018-07-07 ENCOUNTER — Encounter: Payer: Self-pay | Admitting: Family Medicine

## 2018-07-07 ENCOUNTER — Ambulatory Visit: Payer: Self-pay | Admitting: Family Medicine

## 2018-07-07 VITALS — BP 137/88 | HR 81 | Temp 97.5°F | Resp 16 | Ht 65.5 in | Wt 156.2 lb

## 2018-07-07 DIAGNOSIS — E039 Hypothyroidism, unspecified: Secondary | ICD-10-CM

## 2018-07-07 DIAGNOSIS — I10 Essential (primary) hypertension: Secondary | ICD-10-CM

## 2018-07-07 DIAGNOSIS — R6250 Unspecified lack of expected normal physiological development in childhood: Secondary | ICD-10-CM

## 2018-07-07 DIAGNOSIS — R195 Other fecal abnormalities: Secondary | ICD-10-CM | POA: Insufficient documentation

## 2018-07-07 DIAGNOSIS — Z7689 Persons encountering health services in other specified circumstances: Secondary | ICD-10-CM

## 2018-07-07 DIAGNOSIS — M1A9XX1 Chronic gout, unspecified, with tophus (tophi): Secondary | ICD-10-CM

## 2018-07-07 DIAGNOSIS — F259 Schizoaffective disorder, unspecified: Secondary | ICD-10-CM

## 2018-07-07 DIAGNOSIS — J3089 Other allergic rhinitis: Secondary | ICD-10-CM

## 2018-07-07 DIAGNOSIS — R634 Abnormal weight loss: Secondary | ICD-10-CM

## 2018-07-07 DIAGNOSIS — F70 Mild intellectual disabilities: Secondary | ICD-10-CM

## 2018-07-07 MED ORDER — LEVOTHYROXINE SODIUM 25 MCG PO TABS: 25.0000 ug | ORAL_TABLET | Freq: Every day | ORAL | 3 refills | Status: DC

## 2018-07-07 MED ORDER — ALLOPURINOL 300 MG PO TABS: 300.0000 mg | ORAL_TABLET | Freq: Every day | ORAL | 3 refills | Status: DC

## 2018-07-07 NOTE — Assessment & Plan Note (Signed)
Controlled HTN off medication currently Not checking BP regularly   Plan:  1. Remain off meds for now 2. Encourage improved lifestyle - low sodium diet, regular exercise 3. Start monitor BP outside office, bring readings to next visit, if persistently >140/90 or new symptoms notify office sooner

## 2018-07-07 NOTE — Patient Instructions (Addendum)
Thank you for coming to the office today.  Continue Allopurinol 300mg  daily, refilled - today continue to prevent problem  If you get gout flare - take Naproxen (Aleve OTC) 220 or 250mg  pill - 1 or 2 pills per dose - twice a day for 7 to 10 days until resolved  Elevated TSH, which means LOW THYROID hormone (Hypothyroidism) - START NEW MED Levothyroxine 25mcg daily start first thing in morning before breakfast, need empty stomach, and do not take with any other medicines at same time. We can re-check lab in 3 months and discuss dose  For Loose Stools  - Increase hydration with water - Increase fiber in diet (high fiber foods = vegetables, leafy greens, oats/grains) - May take OTC Fiber supplement (metamucil powder or pill/gummy) - May try OTC Probiotic - given sample today to help control bowel function  Most likely weight loss is nutritional and dietary - we can follow-up on this in future. Labs are reassuring  DUE for FASTING BLOOD WORK (no food or drink after midnight before the lab appointment, only water or coffee without cream/sugar on the morning of)  SCHEDULE "Lab Only" visit in the morning at the clinic for lab draw in 3 MONTHS   - Make sure Lab Only appointment is at about 1 week before your next appointment, so that results will be available  For Lab Results, once available within 2-3 days of blood draw, you can can log in to MyChart online to view your results and a brief explanation. Also, we can discuss results at next follow-up visit.   Please schedule a Follow-up Appointment to: Return in about 3 months (around 10/07/2018) for Weight loss, Hypothyroid lab f/u, Psych follow-up / Loose Stools / Gout .  If you have any other questions or concerns, please feel free to call the office or send a message through MyChart. You may also schedule an earlier appointment if necessary.  Additionally, you may be receiving a survey about your experience at our office within a few days to  1 week by e-mail or mail. We value your feedback.  Saralyn PilarAlexander Tramell Piechota, DO [facility]

## 2018-07-07 NOTE — Assessment & Plan Note (Signed)
Continue Allegra anti histamine Future may consider Flonase or other option

## 2018-07-07 NOTE — Assessment & Plan Note (Addendum)
Controlled without recent flare Prior Uric acid normalized in past 2 years on Allopurinol from 8 range down to 3-4 Reviewed diet restrictions avoidance for gout, per AVS handout Refill Allopurinol 300mg  daily May take Naproxen PRN flare in future, avoid indomethicin

## 2018-07-07 NOTE — Assessment & Plan Note (Addendum)
Suspected to be normal due to dietary lifestyle changes and improvements Labs unremarkable for other concerns, has normal albumin and protein, less likely nutritional deficiency or other concern. Can be related to medications as well Follow trend

## 2018-07-07 NOTE — Progress Notes (Signed)
Subjective:    Patient ID: Andres [name], male    DOB: September 04, 1966, 52 y.o.   MRN: [id number]  Andres [name] is a 52 y.o. male presenting on 07/07/2018 for Schizoaffective Mood Disorder and Intellectual Disability  History primarily provided by accompanying family members, sister Tonette Bihari) and father Darcey Demma. Patient is able to provide some history.  Prior PCP Dr Sampson [facility]. He has changed to our office because family members receive their care here.  HPI   Schizoaffective Mood Disorder / Intellcular Disability, Mild / Tardive Dyskinesia - Background information reviewed  - Prior history was treated with other medicines such as Risperidone, Depakote - for up to 20 years previously. Back in 12/2017 and 01/2018 he was evaluated by Webb Silversmith NP at [facility] and thought off-balance and movement problem due to vestibular and was thought to be side effect to Depakote and was discontinued on this. Eventually placed on Ingrezza and Lamotrigine for mood by Psychiatry, then he developed a side effect rash reaction, then was taken to [facility] ED 05/03/18, not consistent with Trudie Buckler Syndrome and he was treated with Prednisone and Benadryl, then discontinued Lamotrigine and switched - Now on Seroquel 25mg  nightly and also 12.5mg  daily PRN for agitation (not needing this PRN dose since off prednisone). Also taking Trazodone 25 nightly (Half tab) every night and occasional extra half tab if needed - Recently Celexa 5mg  was added daily - Today no new concerns, he continues to follow-up with psychiatry, seems current mood and agitation are controlled, has some normal breakthrough anxiety at times  Hearing Loss [facility] followed by Dr Andee Poles. Has hearing aids. He has completed Vestibular Rehab PT ([facility]) at [facility]NT since Dec 2018, now 2 weeks into PT, has plans to do more longer term vestibular PT.  Prior ankle fractures, bilateral. L- ankle prior hardware. About 2013 with prior  fracture. Had prior falls.  Elevated TSH - Last result 9.345 (06/02/18), previous results over past 6-10 months with abnormal readings 7-9. Never on levothyroxine before. Interested to try this given abnormal result. He has had some tiredness and reduced energy overall.  Gout, chronic Prior history of gout problem, has had several flares mostly in feet and also elbow and wrist in past. Prior lab review shows several years ago with Uric Acid elevated 8 to 9. Most recent was normal 3-4 result Uric Acid in 2017. He has continued on Allopurinol 300mg  daily. Need refill today to continue  Weight loss - Was initially 180 lbs then lower weights with improve diet, more vegetables, salads. No longer eats certain meats and improved 6 month ago on diet. No sweets and sodas. - See thyroid above  Health Maintenance:  Last colonoscopy done by [facility] 06/24/15, do not have external result available at this time.  Depression screen PHQ 2/9 07/07/2018  Decreased Interest 1  Down, Depressed, Hopeless 1  PHQ - 2 Score 2  Altered sleeping 2  Tired, decreased energy 2  Change in appetite 0  Feeling bad or failure about yourself  1  Trouble concentrating 2  Moving slowly or fidgety/restless 1  Suicidal thoughts 0  PHQ-9 Score 10  Difficult doing work/chores Somewhat difficult     GAD 7 : Generalized Anxiety Score 07/07/2018  Nervous, Anxious, on Edge 1  Control/stop worrying 3  Worry too much - different things 3  Trouble relaxing 3  Restless 1  Easily annoyed or irritable 1  Afraid - awful might happen 3  Total  GAD 7 Score 15  Anxiety Difficulty Somewhat difficult    Past Medical History:  Diagnosis Date  . Allergy    Past Surgical History:  Procedure Laterality Date  . ANKLE SURGERY    . COLON SURGERY    . HERNIA REPAIR    . TONSILLECTOMY     Social History   Socioeconomic History  . Marital status: Single    Spouse name: Not on file  . Number of children: 0  . Years of education: [employer]  . Highest education level: High school graduate  Occupational History    Comment: disabled  Social Needs  . Financial resource strain: Not hard at all  . Food insecurity:    Worry: Never true    Inability: Never true  . Transportation needs:    Medical: Yes    Non-medical: Yes  Tobacco Use  . Smoking status: Never Smoker  . Smokeless tobacco: Never Used  Substance and Sexual Activity  . Alcohol use: No    Frequency: Never  . Drug use: No  . Sexual activity: Not Currently  Lifestyle  . Physical activity:    Days per week: 0 days    Minutes per session: 0 min  . Stress: Not at all  Relationships  . Social connections:    Talks on phone: More than three times a week    Gets together: More than three times a week    Attends religious service: Never    Active member of club or organization: No    Attends meetings of clubs or organizations: Never    Relationship status: Never married  . Intimate partner violence:    Fear of current or ex partner: No    Emotionally abused: No    Physically abused: No    Forced sexual activity: No  Other Topics Concern  . Not on file  Social History Narrative  . Not on file   Family History  Problem Relation Age of Onset  . Diabetes Mother   . Stroke Mother   . Cancer Father        colon  . Colon cancer Father   . Colon cancer Other   . Mental illness Neg Hx    Current Outpatient Medications on File Prior to Visit  Medication Sig  . citalopram (CELEXA) 10 MG tablet Take 0.5 tablets (5 mg total) by mouth daily with breakfast.  . fexofenadine (ALLEGRA) 180 MG tablet Take 180 mg by mouth.  . QUEtiapine (SEROQUEL) 25 MG tablet Take 0.5 tablets (12.5 mg total) by mouth daily as needed. For severe anxiety/agitation  . QUEtiapine (SEROQUEL) 25 MG tablet Take 1 tablet (25 mg total) by mouth at bedtime.  . traZODone (DESYREL) 50 MG tablet Take 0.5 tablets (25 mg total) by mouth at bedtime as needed for sleep.   No current  facility-administered medications on file prior to visit.     Review of Systems  Constitutional: Negative for activity change, appetite change, chills, diaphoresis, fatigue and fever.  HENT: Negative for congestion, hearing loss and sinus pressure.   Eyes: Negative for visual disturbance.  Respiratory: Negative for apnea, cough, chest tightness, shortness of breath and wheezing.   Cardiovascular: Negative for chest pain, palpitations and leg swelling.  Gastrointestinal: Negative for abdominal pain, anal bleeding, blood in stool, constipation, diarrhea, nausea and vomiting.  Endocrine: Negative for cold intolerance.  Genitourinary: Negative for decreased urine volume, dysuria, frequency, hematuria and urgency.  Musculoskeletal: Negative for arthralgias, back pain and neck  pain.  Skin: Negative for rash.  Allergic/Immunologic: Negative for environmental allergies.  Neurological: Negative for dizziness, weakness, light-headedness, numbness and headaches.  Hematological: Negative for adenopathy.  Psychiatric/Behavioral: Negative for agitation, behavioral problems, dysphoric mood and sleep disturbance. The patient is nervous/anxious.    Per HPI unless specifically indicated above     Objective:    BP 137/88   Pulse 81   Temp (!) 97.5 F (36.4 C) (Axillary)   Resp 16   Ht 5' 5.5" (1.664 m)   Wt 156 lb 3.2 oz (70.9 kg)   SpO2 (!) 81%   BMI 25.60 kg/m   Wt Readings from Last 3 Encounters:  07/07/18 156 lb 3.2 oz (70.9 kg)  06/28/18 158 lb 9.6 oz (71.9 kg)  05/31/18 159 lb 12.8 oz (72.5 kg)    Physical Exam  Constitutional: He is oriented to person, place, and time. He appears well-developed and well-nourished. No distress.  Well-appearing, comfortable, cooperative  HENT:  Head: Normocephalic and atraumatic.  Mouth/Throat: Oropharynx is clear and moist.  Eyes: Conjunctivae are normal. Right eye exhibits no discharge. Left eye exhibits no discharge.  Neck: Normal range of motion.  Neck supple. No thyromegaly present.  Cardiovascular: Normal rate, regular rhythm, normal heart sounds and intact distal pulses.  No murmur heard. Pulmonary/Chest: Effort normal and breath sounds normal. No respiratory distress. He has no wheezes. He has no rales.  Musculoskeletal: Normal range of motion. He exhibits edema (Left lower extremity significant +2 non pitting and some pitting edema, with compression stocking on, some limited movement ankle).  Range of motion ankles slightly reduced L>R  Able to ambulate without assistance  General muscle tone and strength intact distally  No significant muscle atrophy  Lymphadenopathy:    He has no cervical adenopathy.  Neurological: He is alert and oriented to person, place, and time.  Skin: Skin is warm and dry. No rash noted. He is not diaphoretic. No erythema.  Psychiatric: He has a normal mood and affect. His behavior is normal.  Well groomed, good eye contact. Increased frequency of speech with normal thoughts, occasional interrupting and some pressured speech. Appears to be at baseline with some cognitive delay. Minimal abnormal movements of mouth/face.  Nursing note and vitals reviewed.  Results for orders placed or performed during the hospital encounter of 05/03/18  CBC with Differential/Platelet  Result Value Ref Range   WBC 8.4 3.8 - 10.6 K/uL   RBC 5.14 4.40 - 5.90 MIL/uL   Hemoglobin 15.8 13.0 - 18.0 g/dL   HCT 16.145.9 09.640.0 - 04.552.0 %   MCV 89.3 80.0 - 100.0 fL   MCH 30.8 26.0 - 34.0 pg   MCHC 34.5 32.0 - 36.0 g/dL   RDW 40.914.2 81.111.5 - 91.414.5 %   Platelets 154 150 - 440 K/uL   Neutrophils Relative % 79 %   Neutro Abs 6.7 (H) 1.4 - 6.5 K/uL   Lymphocytes Relative 15 %   Lymphs Abs 1.3 1.0 - 3.6 K/uL   Monocytes Relative 5 %   Monocytes Absolute 0.4 0.2 - 1.0 K/uL   Eosinophils Relative 1 %   Eosinophils Absolute 0.0 0 - 0.7 K/uL   Basophils Relative 0 %   Basophils Absolute 0.0 0 - 0.1 K/uL  Comprehensive metabolic panel    Result Value Ref Range   Sodium 137 135 - 145 mmol/L   Potassium 3.7 3.5 - 5.1 mmol/L   Chloride 110 101 - 111 mmol/L   CO2 20 (L) 22 - 32 mmol/L  Glucose, Bld 119 (H) 65 - 99 mg/dL   BUN 30 (H) 6 - 20 mg/dL   Creatinine, Ser 1.30 0.61 - 1.24 mg/dL   Calcium 8.7 (L) 8.9 - 10.3 mg/dL   Total Protein 6.5 6.5 - 8.1 g/dL   Albumin 3.9 3.5 - 5.0 g/dL   AST 25 15 - 41 U/L   ALT 13 (L) 17 - 63 U/L   Alkaline Phosphatase 60 38 - 126 U/L   Total Bilirubin 1.1 0.3 - 1.2 mg/dL   GFR calc non Af Amer >60 >60 mL/min   GFR calc Af Amer >60 >60 mL/min   Anion gap 7 5 - 15  CK  Result Value Ref Range   Total CK 102 49 - 397 U/L  Sedimentation rate  Result Value Ref Range   Sed Rate 9 0 - 20 mm/hr      Assessment & Plan:   Problem List Items Addressed This Visit    Allergic rhinitis    Continue Allegra anti histamine Future may consider Flonase or other option      Chronic tophaceous gout    Controlled without recent flare Prior Uric acid normalized in past 2 years on Allopurinol from 8 range down to 3-4 Reviewed diet restrictions avoidance for gout, per AVS handout Refill Allopurinol 300mg  daily May take Naproxen PRN flare in future, avoid indomethicin      Relevant Medications   allopurinol (ZYLOPRIM) 300 MG tablet   Development delay   Essential hypertension    Controlled HTN off medication currently Not checking BP regularly   Plan:  1. Remain off meds for now 2. Encourage improved lifestyle - low sodium diet, regular exercise 3. Start monitor BP outside office, bring readings to next visit, if persistently >140/90 or new symptoms notify office sooner      Hypothyroidism    Elevated TSH on multiple readings in past 6 months, lab 7 to 9 range No prior dx or treatment Discussed hypothyroidism Will start low dose Levothyroxine daily empty stomach Re-check TSH Free T4 in 3 months adjust dose      Relevant Medications   levothyroxine (SYNTHROID, LEVOTHROID) 25  MCG tablet   Loose stools    Uncertain exact etiology May be functional, dietary or medicine related Counseling on lifestyle/diet changes first - higher fiber diet and metamucil supplement for bulking agent, then in future can add probiotic OTC if need      Mild intellectual disability    Chronic congenital problem Developmental delay with intellectual disability      Schizoaffective disorder (HCC) - Primary    Stable mood/anxiety currently Diagnosed and followed by [facility] Dr Elna Breslow Continue with therapy as well Continue current med regimen Follow-up      Weight loss    Suspected to be normal due to dietary lifestyle changes and improvements Labs unremarkable for other concerns, has normal albumin and protein, less likely nutritional deficiency or other concern. Can be related to medications as well Follow trend        Other Visit Diagnoses    Encounter to establish care with new doctor        Will review records from outside providers prior PCP    Meds ordered this encounter  Medications  . allopurinol (ZYLOPRIM) 300 MG tablet    Sig: Take 1 tablet (300 mg total) by mouth daily.    Dispense:  90 tablet    Refill:  3  . levothyroxine (SYNTHROID, LEVOTHROID) 25 MCG tablet  Sig: Take 1 tablet (25 mcg total) by mouth daily before breakfast. On empty stomach, not to take with other medicines at same time.    Dispense:  30 tablet    Refill:  3     Follow up plan: Return in about 3 months (around 10/07/2018) for Weight loss, Hypothyroid lab f/u, Psych follow-up / Loose Stools / Gout .  Saralyn Pilar, DO [facility] 07/07/2018, 1:31 PM

## 2018-07-07 NOTE — Assessment & Plan Note (Signed)
Uncertain exact etiology May be functional, dietary or medicine related Counseling on lifestyle/diet changes first - higher fiber diet and metamucil supplement for bulking agent, then in future can add probiotic OTC if need

## 2018-07-07 NOTE — Assessment & Plan Note (Signed)
Stable mood/anxiety currently Diagnosed and followed by [facility] Dr Elna BreslowEappen Continue with therapy as well Continue current med regimen Follow-up

## 2018-07-07 NOTE — Assessment & Plan Note (Signed)
Elevated TSH on multiple readings in past 6 months, lab 7 to 9 range No prior dx or treatment Discussed hypothyroidism Will start low dose Levothyroxine daily empty stomach Re-check TSH Free T4 in 3 months adjust dose

## 2018-07-07 NOTE — Assessment & Plan Note (Signed)
Chronic congenital problem Developmental delay with intellectual disability

## 2018-07-25 ENCOUNTER — Telehealth: Payer: Self-pay | Admitting: Family Medicine

## 2018-07-28 NOTE — Telephone Encounter (Signed)
error 

## 2018-08-03 ENCOUNTER — Encounter: Payer: Self-pay | Admitting: Psychiatry

## 2018-08-03 ENCOUNTER — Ambulatory Visit: Payer: Medicare Other | Admitting: Psychiatry

## 2018-08-03 VITALS — BP 151/97 | HR 82 | Ht 65.5 in | Wt 155.6 lb

## 2018-08-03 DIAGNOSIS — F251 Schizoaffective disorder, depressive type: Secondary | ICD-10-CM

## 2018-08-03 DIAGNOSIS — G2119 Other drug induced secondary parkinsonism: Secondary | ICD-10-CM | POA: Diagnosis not present

## 2018-08-03 DIAGNOSIS — G2401 Drug induced subacute dyskinesia: Secondary | ICD-10-CM | POA: Diagnosis not present

## 2018-08-03 DIAGNOSIS — F70 Mild intellectual disabilities: Secondary | ICD-10-CM | POA: Diagnosis not present

## 2018-08-03 MED ORDER — VALBENAZINE TOSYLATE 80 MG PO CAPS: 80.0000 mg | ORAL_CAPSULE | Freq: Every day | ORAL | 1 refills | Status: DC

## 2018-08-03 MED ORDER — CITALOPRAM HYDROBROMIDE 10 MG PO TABS: 10.0000 mg | ORAL_TABLET | Freq: Every day | ORAL | 1 refills | Status: DC

## 2018-08-03 MED ORDER — QUETIAPINE FUMARATE 50 MG PO TABS: 50.0000 mg | ORAL_TABLET | Freq: Every day | ORAL | 1 refills | Status: DC

## 2018-08-03 NOTE — Progress Notes (Signed)
[facility] MD OP Progress Note  08/03/2018 11:55 AM Andres PennaBarry W [name]  MRN:  [id number]  Chief Complaint:  ' I am here for follow up." Chief Complaint    Follow-up     HPI: Andres PrayBarry is a 52 year old Caucasian male who has a history of mood disorder, drug induced movement disorder, TD, mild ID, recent TSH abnormalities, presented to the clinic today for a follow-up visit.  Patient presented along with his Sister Andres [name] who provided collateral information.  Patient appeared to be hyperverbal today.  He seemed to be preoccupied with religion and God and heaven.  Patient otherwise appeared to be alert, oriented and was able to answer questions appropriately.  He is compliant with his medications as prescribed and tolerating the Celexa well.  He denies any GI symptoms or other side effects to Celexa.  He continues to struggle with sleep.  He reports there are times when he wakes up in the middle of the night.  Patient does have a history of auditory and visual hallucinations.  Patient per sister made some comments about visual hallucinations a month ago.  Patient currently denies it however there are times when he sees things even though he is unable to elaborate much.  He is currently on Seroquel.  Discussed increasing the dosage to 50 mg at bedtime.  Patient continues to struggle with TD.  He was initiated on Ingrezza in the past , however was taken off of it when he developed blisters to Lamictal which he was taking at that time.  Patient as well as family would like to try the ingrezza again.  Pt denies any suicidality.  Patient denies any perceptual disturbances. Visit Diagnosis:    ICD-10-CM   1. Schizoaffective disorder, depressive type (HCC) F25.1 citalopram (CELEXA) 10 MG tablet    QUEtiapine (SEROQUEL) 50 MG tablet  2. Mild intellectual disability F70   3. Tardive dyskinesia G24.01 Valbenazine Tosylate (INGREZZA) 80 MG CAPS  4. 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  Past Medical History:  Past Medical History:  Diagnosis Date  . Allergy     Past Surgical History:  Procedure Laterality Date  . ANKLE SURGERY    . COLON SURGERY    . HERNIA REPAIR    . TONSILLECTOMY      Family Psychiatric History: Have reviewed family psychiatric history from my progress note on 03/29/2018.  Family History:  Family History  Problem Relation Age of Onset  . Diabetes Mother   . Stroke Mother   . Cancer Father        colon  . Colon cancer Father   . Colon cancer Other   . Mental illness Neg Hx     Social History: Reviewed social history from my progress note on 03/29/2018. Social History   Socioeconomic History  . Marital status: Single    Spouse name: Not on file  . Number of children: 0  . Years of education: [employer]  . Highest education level: High school graduate  Occupational History    Comment: disabled  Social Needs  . Financial resource strain: Not hard at all  . Food insecurity:    Worry: Never true    Inability: Never true  . Transportation needs:    Medical: Yes    Non-medical: Yes  Tobacco Use  . Smoking status: Never Smoker  . Smokeless tobacco: Never Used  Substance and Sexual Activity  .  Alcohol use: No    Frequency: Never  . Drug use: No  . Sexual activity: Not Currently  Lifestyle  . Physical activity:    Days per week: 0 days    Minutes per session: 0 min  . Stress: Not at all  Relationships  . Social connections:    Talks on phone: More than three times a week    Gets together: More than three times a week    Attends religious service: Never    Active member of club or organization: No    Attends meetings of clubs or organizations: Never    Relationship status: Never married  Other Topics Concern  . Not on file  Social History Narrative  . Not on file    Allergies:  Allergies  Allergen Reactions  . Prednisone Other (See Comments)     Did not like the way it made him feel   . Lamotrigine Rash    Metabolic Disorder Labs: No results found for: HGBA1C, MPG No results found for: PROLACTIN No results found for: CHOL, TRIG, HDL, CHOLHDL, VLDL, LDLCALC No results found for: TSH  Therapeutic Level Labs: No results found for: LITHIUM No results found for: VALPROATE No components found for:  CBMZ  Current Medications: Current Outpatient Medications  Medication Sig Dispense Refill  . allopurinol (ZYLOPRIM) 300 MG tablet Take 1 tablet (300 mg total) by mouth daily. 90 tablet 3  . cholecalciferol (VITAMIN D) 1000 units tablet Take 1,000 Units by mouth daily.    . citalopram (CELEXA) 10 MG tablet Take 1 tablet (10 mg total) by mouth daily with breakfast. 90 tablet 1  . fexofenadine (ALLEGRA) 180 MG tablet Take 180 mg by mouth.    . levothyroxine (SYNTHROID, LEVOTHROID) 25 MCG tablet Take 1 tablet (25 mcg total) by mouth daily before breakfast. On empty stomach, not to take with other medicines at same time. 30 tablet 3  . QUEtiapine (SEROQUEL) 25 MG tablet Take 0.5 tablets (12.5 mg total) by mouth daily as needed. For severe anxiety/agitation 15 tablet 2  . thiamine (VITAMIN B-1) 100 MG tablet Take 100 mg by mouth daily.    . traZODone (DESYREL) 50 MG tablet Take 0.5 tablets (25 mg total) by mouth at bedtime as needed for sleep. 45 tablet 2  . QUEtiapine (SEROQUEL) 50 MG tablet Take 1 tablet (50 mg total) by mouth at bedtime. 90 tablet 1  . Valbenazine Tosylate (INGREZZA) 80 MG CAPS Take 80 mg by mouth at bedtime. Pt has supplies 90 capsule 1   No current facility-administered medications for this visit.      Musculoskeletal: Strength & Muscle Tone: within normal limits Gait & Station: normal Patient leans: N/A  Psychiatric Specialty Exam: Review of Systems  Neurological: Positive for tremors.  Psychiatric/Behavioral: The patient is nervous/anxious and has insomnia.   All other systems reviewed and are negative.    Blood pressure (!) 151/97, pulse 82, height 5' 5.5" (1.664 m), weight 155 lb 9.6 oz (70.6 kg), SpO2 99 %.Body mass index is 25.5 kg/m.  General Appearance: Casual  Eye Contact:  Fair  Speech:  Pressured  Volume:  Normal  Mood:  Anxious  Affect:  Congruent  Thought Process:  Goal Directed and Descriptions of Associations: Intact  Orientation:  Full (Time, Place, and Person)  Thought Content: Logical   Suicidal Thoughts:  No  Homicidal Thoughts:  No  Memory:  Immediate;   Fair Recent;   Fair Remote;   Fair  Judgement:  Fair  Insight:  Fair  Psychomotor Activity:  Increased and Tremor  Concentration:  Concentration: Fair and Attention Span: Fair  Recall:  [employer] of Knowledge: Fair  Language: Fair  Akathisia:  No  Handed:  Right  AIMS (if indicated): Pt has TD  Assets:  Communication Skills Desire for Improvement Housing Social Support  ADL's:  Intact  Cognition: at baseline  Sleep:  restless   Screenings: AIMS     Office Visit from 05/31/2018 in [facility] Office Visit from 05/16/2018 in [facility] Office Visit from 04/25/2018 in [facility] Office Visit from 03/08/2018 in [facility] Office Visit from 02/22/2018 in [facility]  AIMS Total Score  8  9  6  9  8     GAD-7     Office Visit from 07/07/2018 in [facility]  Total GAD-7 Score  15    PHQ2-9     Office Visit from 07/07/2018 in [place]  PHQ-2 Total Score  2  PHQ-9 Total Score  10       Assessment and Plan: Stoy is a 52 year old Caucasian male who has a history of mild intellectual disability, mood disorder, TD, presented to the clinic today for a follow-up visit.  Patient was initially referred to the clinic by Dr. Clelia Croft, neurologist as well as his primary medical doctor.  Patient presented with severe abnormal involuntary movement  disorder likely due to antipsychotic therapy at that time.  Patient hence was taken off of some of his medications like risperidone, Depakote.  However a trial of Lamictal caused him to have severe blisters/rash.  Patient even though was started on Ingrezza TD, did not continue it at that time due to the Lamictal rash.  Patient today seems to be hyperverbal and also has some sleep problems.  Patient also continues to have movement problems from the TD.  Hence will readjust his medications as well as reinitiate Ingrezza for TD.  Plan Schizoaffective disorder Increase Seroquel to 50 mg p.o. nightly Continue trazodone 25-50 mg p.o. nightly as needed Increase Celexa to 10 mg p.o. daily Patient also has Seroquel 12.5 mg daily as needed for severe agitation, he does not use it as much.  For TD Restart Ingrezza 80 mg po qhs.  Discussed elevated blood pressure with patient.  Patient to follow-up with his primary medical doctor.  He was also recently started on Synthroid for abnormal TSH.  Follow-up in clinic in 1 month  More than 50 % of the time was spent for psychoeducation and supportive psychotherapy and care coordination.  This note was generated in part or whole with voice recognition software. Voice recognition is usually quite accurate but there are transcription errors that can and very often do occur. I apologize for any typographical errors that were not detected and corrected.       Jomarie Longs, MD 08/04/2018, 8:37 AM

## 2018-08-04 ENCOUNTER — Encounter: Payer: Self-pay | Admitting: Psychiatry

## 2018-09-05 ENCOUNTER — Ambulatory Visit: Payer: Medicare Other | Admitting: Psychiatry

## 2018-09-07 ENCOUNTER — Ambulatory Visit: Payer: Medicare Other | Admitting: Psychiatry

## 2018-09-07 ENCOUNTER — Encounter: Payer: Self-pay | Admitting: Psychiatry

## 2018-09-07 ENCOUNTER — Other Ambulatory Visit: Payer: Self-pay

## 2018-09-07 VITALS — BP 166/100 | HR 71 | Temp 97.8°F | Wt 163.0 lb

## 2018-09-07 DIAGNOSIS — G2401 Drug induced subacute dyskinesia: Secondary | ICD-10-CM

## 2018-09-07 DIAGNOSIS — F70 Mild intellectual disabilities: Secondary | ICD-10-CM

## 2018-09-07 DIAGNOSIS — F251 Schizoaffective disorder, depressive type: Secondary | ICD-10-CM

## 2018-09-07 MED ORDER — CITALOPRAM HYDROBROMIDE 20 MG PO TABS: 20.0000 mg | ORAL_TABLET | Freq: Every day | ORAL | 0 refills | Status: DC

## 2018-09-07 MED ORDER — TRAZODONE HCL 50 MG PO TABS: 25.0000 mg | ORAL_TABLET | Freq: Every evening | ORAL | 2 refills | Status: DC | PRN

## 2018-09-07 NOTE — Progress Notes (Signed)
BH MD OP Progress Note  09/07/2018 12:38 PM Andres [name]  MRN:  [id number]  Chief Complaint: ' I am here for follow up." Chief Complaint    Follow-up; Medication Refill     HPI: Andres [name] is a 52 yr old Caucasian male who has a history of mood disorder, drug induced movement disorder, TD, mild ID, recent TSH abnormalities, presented to the clinic today for a follow-up visit.  Patient presented along with his Sister Andres [name].  Patient today appears to be anxious and hyperverbal.  Patient however has been able to answer questions appropriately.  He denies any significant mood lability.  He denies any perceptual disturbances.  Patient reports sleep is improved.  Patient continues to have TD, is currently on Ingrezza, denies any significant improvement.  However he has been on it only since the past 4 weeks or so.  Patient denies any suicidality.  Patient denies any perceptual disturbances.  Per Sister , pt with some anxiety sx on and off. Discussed increasing his Celexa .  Visit Diagnosis:    ICD-10-CM   1. Schizoaffective disorder, depressive type (HCC) F25.1 traZODone (DESYREL) 50 MG tablet  2. Tardive dyskinesia G24.01   3. Mild intellectual disability F70     Past Psychiatric History: Reviewed past psychiatric history from my progress note on 03/29/2018  Past Medical History:  Past Medical History:  Diagnosis Date  . Allergy     Past Surgical History:  Procedure Laterality Date  . ANKLE SURGERY    . COLON SURGERY    . HERNIA REPAIR    . TONSILLECTOMY      Family Psychiatric History: Reviewed family psychiatric history from my progress note on 03/29/2018  Family History:  Family History  Problem Relation Age of Onset  . Diabetes Mother   . Stroke Mother   . Cancer Father        colon  . Colon cancer Father   . Colon cancer Other   . Mental illness Neg Hx     Social History: Reviewed social history from my progress note on 03/29/2018 Social History   Socioeconomic  History  . Marital status: Single    Spouse name: Not on file  . Number of children: 0  . Years of education: [employer]  . Highest education level: High school graduate  Occupational History    Comment: disabled  Social Needs  . Financial resource strain: Not hard at all  . Food insecurity:    Worry: Never true    Inability: Never true  . Transportation needs:    Medical: Yes    Non-medical: Yes  Tobacco Use  . Smoking status: Never Smoker  . Smokeless tobacco: Never Used  Substance and Sexual Activity  . Alcohol use: No    Frequency: Never  . Drug use: No  . Sexual activity: Not Currently  Lifestyle  . Physical activity:    Days per week: 0 days    Minutes per session: 0 min  . Stress: Not at all  Relationships  . Social connections:    Talks on phone: More than three times a week    Gets together: More than three times a week    Attends religious service: Never    Active member of club or organization: No    Attends meetings of clubs or organizations: Never    Relationship status: Never married  Other Topics Concern  . Not on file  Social History Narrative  . Not on file  Allergies:  Allergies  Allergen Reactions  . Prednisone Other (See Comments)    Did not like the way it made him feel   . Lamotrigine Rash    Metabolic Disorder Labs: No results found for: HGBA1C, MPG No results found for: PROLACTIN No results found for: CHOL, TRIG, HDL, CHOLHDL, VLDL, LDLCALC No results found for: TSH  Therapeutic Level Labs: No results found for: LITHIUM No results found for: VALPROATE No components found for:  CBMZ  Current Medications: Current Outpatient Medications  Medication Sig Dispense Refill  . allopurinol (ZYLOPRIM) 300 MG tablet Take 1 tablet (300 mg total) by mouth daily. 90 tablet 3  . cholecalciferol (VITAMIN D) 1000 units tablet Take 1,000 Units by mouth daily.    . fexofenadine (ALLEGRA) 180 MG tablet Take 180 mg by mouth.    . levothyroxine  (SYNTHROID, LEVOTHROID) 25 MCG tablet Take 1 tablet (25 mcg total) by mouth daily before breakfast. On empty stomach, not to take with other medicines at same time. 30 tablet 3  . QUEtiapine (SEROQUEL) 25 MG tablet Take 0.5 tablets (12.5 mg total) by mouth daily as needed. For severe anxiety/agitation 15 tablet 2  . QUEtiapine (SEROQUEL) 50 MG tablet Take 1 tablet (50 mg total) by mouth at bedtime. 90 tablet 1  . thiamine (VITAMIN B-1) 100 MG tablet Take 100 mg by mouth daily.    . traZODone (DESYREL) 50 MG tablet Take 0.5 tablets (25 mg total) by mouth at bedtime as needed for sleep. 45 tablet 2  . Valbenazine Tosylate (INGREZZA) 80 MG CAPS Take 80 mg by mouth at bedtime. Pt has supplies 90 capsule 1  . citalopram (CELEXA) 20 MG tablet Take 1 tablet (20 mg total) by mouth daily with breakfast. 90 tablet 0   No current facility-administered medications for this visit.      Musculoskeletal: Strength & Muscle Tone: within normal limits Gait & Station: normal Patient leans: N/A  Psychiatric Specialty Exam: Review of Systems  Psychiatric/Behavioral: The patient is nervous/anxious.   All other systems reviewed and are negative.   Blood pressure (!) 166/100, pulse 71, temperature 97.8 F (36.6 C), temperature source Tympanic, weight 163 lb (73.9 kg).Body mass index is 26.71 kg/m.  General Appearance: Casual  Eye Contact:  Fair  Speech:  Clear and Coherent  Volume:  Normal  Mood:  Anxious  Affect:  Appropriate  Thought Process:  Goal Directed and Descriptions of Associations: Circumstantial  Orientation:  Full (Time, Place, and Person)  Thought Content: Logical   Suicidal Thoughts:  No  Homicidal Thoughts:  No  Memory:  Immediate;   Fair Recent;   Fair Remote;   Fair  Judgement:  Fair  Insight:  Fair  Psychomotor Activity:  Tremor  Concentration:  Concentration: Fair and Attention Span: Fair  Recall:  [employer] of Knowledge: Fair  Language: Fair  Akathisia:  No  Handed:   Right  AIMS (if indicated): 8  Assets:  Communication Skills Desire for Improvement Social Support  ADL's:  Intact  Cognition: WNL  Sleep:  improving   Screenings: AIMS     Office Visit from 09/07/2018 in [facility] Office Visit from 05/31/2018 in [facility] Office Visit from 05/16/2018 in [facility] Office Visit from 04/25/2018 in [facility] Office Visit from 03/08/2018 in [facility]  AIMS Total Score  8  8  9  6  9     GAD-7     Office  Visit from 07/07/2018 in [facility]  Total GAD-7 Score  15    PHQ2-9     Office Visit from 07/07/2018 in [facility]  PHQ-2 Total Score  2  PHQ-9 Total Score  10       Assessment and Plan: [name] is a 52 year old Caucasian male who has a history of schizoaffective disorder mild intellectual disability, TD, presented to the clinic today for a follow-up visit.  Patient was initially referred to the clinic by Dr. Clelia CroftShaw, neurologist as well as his primary medical doctor.  Patient presented with severe abnormal involuntary movement disorder likely due to antipsychotic therapy at that time.  Patient hence was taken off of some of his medications and is currently improving.  Patient continues to struggle with some anxiety symptoms on and off.  Hence we will continue to make the following meds changes.  Plan as noted below.  Plan Schizoaffective disorder Seroquel 50 mg p.o. nightly Trazodone 25-50 mg p.o. nightly as needed Celexa increased to 20 mg p.o. daily Patient also has Seroquel 12.5 mg daily as needed for severe anxiety/agitation.  For TD Ingrezza 80 mg po qhs. AIMS -8  Discussed going to a day program, provided the information.  Patient will continue to follow-up with his primary medical doctor for elevated blood pressure as well as his thyroid abnormalities.  He continues  to be on Synthroid.  Follow-up in clinic in 6-8 weeks or sooner if needed.  More than 50 % of the time was spent for psychoeducation and supportive psychotherapy and care coordination.  This note was generated in part or whole with voice recognition software. Voice recognition is usually quite accurate but there are transcription errors that can and very often do occur. I apologize for any typographical errors that were not detected and corrected.         Jomarie LongsSaramma Dusti Tetro, MD 09/07/2018, 12:38 PM

## 2018-09-07 NOTE — Patient Instructions (Signed)
Your Celexa has been increased to 20 mg daily.

## 2018-10-05 ENCOUNTER — Ambulatory Visit (INDEPENDENT_AMBULATORY_CARE_PROVIDER_SITE_OTHER): Payer: Medicare Other

## 2018-10-05 ENCOUNTER — Other Ambulatory Visit: Payer: Self-pay | Admitting: Family Medicine

## 2018-10-05 ENCOUNTER — Other Ambulatory Visit: Payer: Self-pay

## 2018-10-05 DIAGNOSIS — I10 Essential (primary) hypertension: Secondary | ICD-10-CM

## 2018-10-05 DIAGNOSIS — M1A9XX1 Chronic gout, unspecified, with tophus (tophi): Secondary | ICD-10-CM

## 2018-10-05 DIAGNOSIS — Z23 Encounter for immunization: Secondary | ICD-10-CM | POA: Diagnosis not present

## 2018-10-05 DIAGNOSIS — F259 Schizoaffective disorder, unspecified: Secondary | ICD-10-CM

## 2018-10-05 DIAGNOSIS — E039 Hypothyroidism, unspecified: Secondary | ICD-10-CM

## 2018-10-05 DIAGNOSIS — E559 Vitamin D deficiency, unspecified: Secondary | ICD-10-CM

## 2018-10-05 DIAGNOSIS — R7309 Other abnormal glucose: Secondary | ICD-10-CM

## 2018-10-06 LAB — BASIC METABOLIC PANEL WITH GFR
BUN: 25 mg/dL (ref 7–25)
CHLORIDE: 110 mmol/L (ref 98–110)
CO2: 22 mmol/L (ref 20–32)
CREATININE: 0.96 mg/dL (ref 0.70–1.33)
Calcium: 9.5 mg/dL (ref 8.6–10.3)
GFR, Est African American: 103 mL/min/{1.73_m2} (ref >=60)
GFR, Est Non African American: 89 mL/min/{1.73_m2} (ref >=60)
GLUCOSE: 80 mg/dL (ref 65–99)
Potassium: 3.9 mmol/L (ref 3.5–5.3)
Sodium: 142 mmol/L (ref 135–146)

## 2018-10-06 LAB — T4, FREE: Free T4: 1 ng/dL (ref 0.8–1.8)

## 2018-10-06 LAB — TSH: TSH: 2.69 mIU/L (ref 0.40–4.50)

## 2018-10-06 LAB — URIC ACID: URIC ACID, SERUM: 4.1 mg/dL (ref 4.0–8.0)

## 2018-10-06 LAB — HEMOGLOBIN A1C
Hgb A1c MFr Bld: 4.8 % of total Hgb (ref <5.7)
Mean Plasma Glucose: 91 (calc)
eAG (mmol/L): 5 (calc)

## 2018-10-06 LAB — VITAMIN D 25 HYDROXY (VIT D DEFICIENCY, FRACTURES): Vit D, 25-Hydroxy: 33 ng/mL (ref 30–100)

## 2018-10-12 ENCOUNTER — Ambulatory Visit (INDEPENDENT_AMBULATORY_CARE_PROVIDER_SITE_OTHER): Payer: Medicare Other | Admitting: Family Medicine

## 2018-10-12 ENCOUNTER — Encounter: Payer: Self-pay | Admitting: Family Medicine

## 2018-10-12 ENCOUNTER — Encounter: Payer: Self-pay | Admitting: Psychiatry

## 2018-10-12 ENCOUNTER — Ambulatory Visit: Payer: Medicare Other | Admitting: Psychiatry

## 2018-10-12 VITALS — BP 145/87 | HR 60 | Ht 65.5 in | Wt 174.0 lb

## 2018-10-12 DIAGNOSIS — E039 Hypothyroidism, unspecified: Secondary | ICD-10-CM | POA: Diagnosis not present

## 2018-10-12 DIAGNOSIS — F70 Mild intellectual disabilities: Secondary | ICD-10-CM | POA: Diagnosis not present

## 2018-10-12 DIAGNOSIS — G2401 Drug induced subacute dyskinesia: Secondary | ICD-10-CM

## 2018-10-12 DIAGNOSIS — F251 Schizoaffective disorder, depressive type: Secondary | ICD-10-CM | POA: Diagnosis not present

## 2018-10-12 MED ORDER — LEVOTHYROXINE SODIUM 25 MCG PO TABS: 25.0000 ug | ORAL_TABLET | Freq: Every day | ORAL | 1 refills | Status: DC

## 2018-10-12 MED ORDER — PROPRANOLOL HCL 10 MG PO TABS: 10.0000 mg | ORAL_TABLET | Freq: Two times a day (BID) | ORAL | 1 refills | Status: DC | PRN

## 2018-10-12 MED ORDER — CITALOPRAM HYDROBROMIDE 20 MG PO TABS: 10.0000 mg | ORAL_TABLET | Freq: Every day | ORAL | 0 refills | Status: DC

## 2018-10-12 NOTE — Patient Instructions (Addendum)
Thank you for coming to the office today.  TSH / Thyroid results are normal. Keep taking Levothyroxine daily, sent 90 day supply.  DUE for FASTING BLOOD WORK (no food or drink after midnight before the lab appointment, only water or coffee without cream/sugar on the morning of)  SCHEDULE "Lab Only" visit in the morning at the clinic for lab draw in 4 MONTHS   - Make sure Lab Only appointment is at about 1 week before your next appointment, so that results will be available  For Lab Results, once available within 2-3 days of blood draw, you can can log in to MyChart online to view your results and a brief explanation. Also, we can discuss results at next follow-up visit.   Please schedule a Follow-up Appointment to: Return in about 4 months (around 02/12/2019) for Hypothyroidism, lab results.  If you have any other questions or concerns, please feel free to call the office or send a message through MyChart. You may also schedule an earlier appointment if necessary.  Additionally, you may be receiving a survey about your experience at our office within a few days to 1 week by e-mail or mail. We value your feedback.  Saralyn Pilar, DO [facility], [place]

## 2018-10-12 NOTE — Progress Notes (Signed)
BH MD OP Progress Note  10/12/2018 5:04 PM ARA MANO  MRN:  [id number]  Chief Complaint: ' I am here for follow up." Chief Complaint    Follow-up     HPI: Ferdinand is a 52 year old Caucasian male who has a history of mood disorder, drug induced movement disorder, TD, mild ID, recent TSH abnormalities, presented to the clinic today for a follow-up visit.  Patient presented along with his Sister Lupita Leash as well as dad.  Patient today seems to be calm, cooperative.  His family-sister and father provided collateral information.  Per family patient has been having some anger issues recently.  Since his last visit with writer on 09/07/2018 he has had a few episodes when he was noted as being angry at family members.  Patient this morning had a meltdown  which is unusual for him.  Per father they do have mild altercations on and off however his recent behaviors are not usual for him.  Patient denies any suicidality.  Patient denies any perceptual disturbances at this time.  Patient's abnormal movements of his bilateral upper extremities have improved.  He does have TD and is currently on Ingrezza.  Patient reports he has started going  to the day program at '[facility]  He goes there 2 days a week and has been enjoying it.  He also gets house cleaning services 2 days a week and per family he has been spending time playing board games with the person who comes to clean his house.  Patient continues to sleep good and is compliant with his sleep medications.  Discussed with patient as well as family that his Celexa can be reduced to a lower dose since it is possible his recent irritability may be due to the increase in Celexa dosage.  Also will provide propranolol as needed for extreme anxiety or agitation.    Patient had appointment with his primary medical doctor this morning and his thyroid function is currently stable per report.      Visit Diagnosis:    ICD-10-CM   1.  Schizoaffective disorder, depressive type (HCC) F25.1   2. Tardive dyskinesia G24.01   3. Mild intellectual disability F70     Past Psychiatric History: Reviewed past psychiatric history from my progress note on 03/29/2018.  Past Medical History:  Past Medical History:  Diagnosis Date  . Allergy     Past Surgical History:  Procedure Laterality Date  . ANKLE SURGERY    . COLON SURGERY    . HERNIA REPAIR    . TONSILLECTOMY      Family Psychiatric History: Reviewed family psychiatric history from my progress note on 03/29/2018  Family History:  Family History  Problem Relation Age of Onset  . Diabetes Mother   . Stroke Mother   . Cancer Father        colon  . Colon cancer Father   . Colon cancer Other   . Mental illness Neg Hx     Social History: Reviewed social history from my progress note on 03/29/2018 Social History   Socioeconomic History  . Marital status: Single    Spouse name: Not on file  . Number of children: 0  . Years of education: [employer]  . Highest education level: High school graduate  Occupational History    Comment: disabled  Social Needs  . Financial resource strain: Not hard at all  . Food insecurity:    Worry: Never true    Inability: Never true  .  Transportation needs:    Medical: Yes    Non-medical: Yes  Tobacco Use  . Smoking status: Never Smoker  . Smokeless tobacco: Never Used  Substance and Sexual Activity  . Alcohol use: No    Frequency: Never  . Drug use: No  . Sexual activity: Not Currently  Lifestyle  . Physical activity:    Days per week: 0 days    Minutes per session: 0 min  . Stress: Not at all  Relationships  . Social connections:    Talks on phone: More than three times a week    Gets together: More than three times a week    Attends religious service: Never    Active member of club or organization: No    Attends meetings of clubs or organizations: Never    Relationship status: Never married  Other Topics Concern   . Not on file  Social History Narrative  . Not on file    Allergies:  Allergies  Allergen Reactions  . Prednisone Other (See Comments)    Did not like the way it made him feel   . Lamotrigine Rash    Metabolic Disorder Labs: Lab Results  Component Value Date   HGBA1C 4.8 10/05/2018   MPG 91 10/05/2018   No results found for: PROLACTIN No results found for: CHOL, TRIG, HDL, CHOLHDL, VLDL, LDLCALC Lab Results  Component Value Date   TSH 2.69 10/05/2018    Therapeutic Level Labs: No results found for: LITHIUM No results found for: VALPROATE No components found for:  CBMZ  Current Medications: Current Outpatient Medications  Medication Sig Dispense Refill  . allopurinol (ZYLOPRIM) 300 MG tablet Take 1 tablet (300 mg total) by mouth daily. 90 tablet 3  . cholecalciferol (VITAMIN D) 1000 units tablet Take 1,000 Units by mouth daily.    . citalopram (CELEXA) 20 MG tablet Take 0.5 tablets (10 mg total) by mouth daily with breakfast. Pt has supplies at home 45 tablet 0  . fexofenadine (ALLEGRA) 180 MG tablet Take 180 mg by mouth.    . levothyroxine (SYNTHROID, LEVOTHROID) 25 MCG tablet Take 1 tablet (25 mcg total) by mouth daily before breakfast. On empty stomach, not to take with other medicines at same time. 90 tablet 1  . propranolol (INDERAL) 10 MG tablet Take 1 tablet (10 mg total) by mouth 2 (two) times daily as needed. For severe anxiety/agitation 60 tablet 1  . QUEtiapine (SEROQUEL) 25 MG tablet Take 0.5 tablets (12.5 mg total) by mouth daily as needed. For severe anxiety/agitation 15 tablet 2  . QUEtiapine (SEROQUEL) 50 MG tablet Take 1 tablet (50 mg total) by mouth at bedtime. 90 tablet 1  . thiamine (VITAMIN B-1) 100 MG tablet Take 100 mg by mouth daily.    . traZODone (DESYREL) 50 MG tablet Take 0.5 tablets (25 mg total) by mouth at bedtime as needed for sleep. 45 tablet 2  . Valbenazine Tosylate (INGREZZA) 80 MG CAPS Take 80 mg by mouth at bedtime. Pt has supplies 90  capsule 1   No current facility-administered medications for this visit.      Musculoskeletal: Strength & Muscle Tone: within normal limits Gait & Station: normal Patient leans: N/A  Psychiatric Specialty Exam: Review of Systems  Psychiatric/Behavioral: The patient is nervous/anxious.   All other systems reviewed and are negative.   Blood pressure (!) 145/87, pulse 60, height 5' 5.5" (1.664 m), weight 174 lb (78.9 kg).Body mass index is 28.51 kg/m.  General Appearance: Casual  Eye  Contact:  Good  Speech:  Clear and Coherent  Volume:  Normal  Mood:  Anxious  Affect:  Congruent  Thought Process:  Goal Directed and Descriptions of Associations: Intact  Orientation:  Full (Time, Place, and Person)  Thought Content: Logical   Suicidal Thoughts:  No  Homicidal Thoughts:  No  Memory:  Immediate;   Fair Recent;   Fair Remote;   Fair  Judgement:  Fair  Insight:  Fair  Psychomotor Activity:  Tremor  Concentration:  Concentration: Fair and Attention Span: Fair  Recall:  [employer] of Knowledge: Fair  Language: Fair  Akathisia:  No  Handed:  Right  AIMS (if indicated): has TD , noted as improving, tremors of BL hands   Assets:  Communication Skills Desire for Improvement Housing Social Support Transportation  ADL's:  Intact  Cognition: WNL  Sleep:  Fair   Screenings: AIMS     Office Visit from 09/07/2018 in [facility] Office Visit from 05/31/2018 in [facility] Office Visit from 05/16/2018 in [facility] Office Visit from 04/25/2018 in [facility] Office Visit from 03/08/2018 in [facility]  AIMS Total Score  8  8  9  6  9     GAD-7     Office Visit from 07/07/2018 in [facility]  Total GAD-7 Score  15    PHQ2-9     Office Visit from 07/07/2018 in [place]  PHQ-2 Total Score  2  PHQ-9 Total Score   10       Assessment and Plan:   Ayyan is a 52 year old Caucasian male who has a history of schizoaffective disorder, mild intellectual disability, TD, presented to the clinic today for a follow-up visit.  Patient was initially referred by Dr. Clelia Croft, neurologist as well as his primary medical doctor.  Patient presented with severe abnormal involuntary movement disorder likely due to antipsychotic therapy at that time.  Patient continues to struggle with anxiety symptoms on and off.  Patient today presented since family was worried about his recent irritability and anger problems.Pt was observed as calm and cooperative during the evaluation. Discussed medication changes as noted below.  Plan For schizoaffective disorder Continue Seroquel 50 mg p.o. nightly Trazodone 25-50 mg p.o. nightly as needed Reduce Celexa to 10 mg p.o. daily Patient also has Seroquel 12.5 mg daily as needed for severe anxiety agitation Add propanolol 10 mg p.o. twice daily as needed for severe anxiety symptoms.     TD Ingrezza 80 mg po daily.  Patient has started attending day program.  He is going there 2 days a week.  Patient will continue to manage his thyroid abnormalities with his primary medical doctor.  He is currently on Synthroid.  Follow-up in clinic in 4 weeks or sooner if needed.  More than 50 % of the time was spent for psychoeducation and supportive psychotherapy and care coordination.  This note was generated in part or whole with voice recognition software. Voice recognition is usually quite accurate but there are transcription errors that can and very often do occur. I apologize for any typographical errors that were not detected and corrected.         Jomarie Longs, MD 10/13/2018, 9:34 AM

## 2018-10-12 NOTE — Progress Notes (Signed)
Subjective:    Patient ID: Andres [name], male    DOB: Oct 17, 1966, 52 y.o.   MRN: [id number]  Andres [name] is a 52 y.o. male presenting on 10/12/2018 for Hypothyroidism   HPI   FOLLOW-UP Elevated TSH / Hypothyroidism / Weight Loss Prior result TSH 9.345 (06/02/18), previous results over past 6-10 months with abnormal readings 7-9. Never on levothyroxine before. Now since last visit started on Levothyroxine daily. - Interval lab shows TSH 2.69 and normal Free T4 now. - Family reports he is doing well, seems actually his metabolism has changed so that he can maintain or slightly gain some weight as he was having weight loss problems. His appetite is good. Energy seems improved. - Was initially 180 lbs then lower weights with improve diet, more vegetables, salads. No longer eats certain meats and improved 6 month ago on diet. No sweets and sodas. Admits some edema Admits wt gain - Denies any new complaints, abnormal temperature changes, nausea vomiting, fever chills sweats  Depression screen PHQ 2/9 07/07/2018  Decreased Interest 1  Down, Depressed, Hopeless 1  PHQ - 2 Score 2  Altered sleeping 2  Tired, decreased energy 2  Change in appetite 0  Feeling bad or failure about yourself  1  Trouble concentrating 2  Moving slowly or fidgety/restless 1  Suicidal thoughts 0  PHQ-9 Score 10  Difficult doing work/chores Somewhat difficult    Social History   Tobacco Use  . Smoking status: Never Smoker  . Smokeless tobacco: Never Used  Substance Use Topics  . Alcohol use: No    Frequency: Never  . Drug use: No    Review of Systems Per HPI unless specifically indicated above     Objective:    BP 110/67   Pulse 61   Temp 97.7 F (36.5 C) (Oral)   Resp 16   Ht 5' 5.5" (1.664 m)   Wt 173 lb (78.5 kg)   BMI 28.35 kg/m   Wt Readings from Last 3 Encounters:  10/12/18 173 lb (78.5 kg)  07/07/18 156 lb 3.2 oz (70.9 kg)  05/03/18 171 lb (77.6 kg)    Physical Exam    Constitutional: He is oriented to person, place, and time. He appears well-developed and well-nourished. No distress.  Well-appearing, comfortable, cooperative  HENT:  Head: Normocephalic and atraumatic.  Mouth/Throat: Oropharynx is clear and moist.  Eyes: Conjunctivae are normal. Right eye exhibits no discharge. Left eye exhibits no discharge.  Cardiovascular: Normal rate.  Pulmonary/Chest: Effort normal.  Musculoskeletal: He exhibits no edema.  Neurological: He is alert and oriented to person, place, and time.  Skin: Skin is warm and dry. No rash noted. He is not diaphoretic. No erythema.  Psychiatric: He has a normal mood and affect. His behavior is normal.  Well groomed, good eye contact. Increased frequency of speech with normal thoughts, occasional interrupting and some pressured speech. Appears to be at baseline with some cognitive delay. Minimal abnormal movements of mouth/face.   Nursing note and vitals reviewed.  Results for orders placed or performed in visit on 10/05/18  VITAMIN D 25 Hydroxy (Vit-D Deficiency, Fractures)  Result Value Ref Range   Vit D, 25-Hydroxy 33 30 - 100 ng/mL  Uric acid  Result Value Ref Range   Uric Acid, Serum 4.1 4.0 - 8.0 mg/dL  T4, free  Result Value Ref Range   Free T4 1.0 0.8 - 1.8 ng/dL  TSH  Result Value Ref Range   TSH 2.69 0.40 -  4.50 mIU/L  BASIC METABOLIC PANEL WITH GFR  Result Value Ref Range   Glucose, Bld 80 65 - 99 mg/dL   BUN 25 7 - 25 mg/dL   Creat 4.09 8.11 - 9.14 mg/dL   GFR, Est Non African American 89 > OR = 60 mL/min/1.96m2   GFR, Est African American 103 > OR = 60 mL/min/1.63m2   BUN/Creatinine Ratio NOT APPLICABLE 6 - 22 (calc)   Sodium 142 135 - 146 mmol/L   Potassium 3.9 3.5 - 5.3 mmol/L   Chloride 110 98 - 110 mmol/L   CO2 22 20 - 32 mmol/L   Calcium 9.5 8.6 - 10.3 mg/dL  Hemoglobin N8G  Result Value Ref Range   Hgb A1c MFr Bld 4.8 <5.7 % of total Hgb   Mean Plasma Glucose 91 (calc)   eAG (mmol/L) 5.0  (calc)      Assessment & Plan:   Problem List Items Addressed This Visit    Hypothyroidism    Improved TSH, now normalized on levothyroxine low dose Last TSH/Free T4 normal Likely metabolism more balanced, appetite improved, some normal wt gain  Plan Continue current Levothyroxine daily - refill 90 day supply Re-check TSH Free T4 in 4 months Follow-up      Relevant Medications   levothyroxine (SYNTHROID, LEVOTHROID) 25 MCG tablet   Other Relevant Orders   TSH   T4, free      Meds ordered this encounter  Medications  . levothyroxine (SYNTHROID, LEVOTHROID) 25 MCG tablet    Sig: Take 1 tablet (25 mcg total) by mouth daily before breakfast. On empty stomach, not to take with other medicines at same time.    Dispense:  90 tablet    Refill:  1    Follow up plan: Return in about 4 months (around 02/12/2019) for Hypothyroidism, lab results.  Future labs ordered for 02/06/19 - thyroid function labs  Saralyn Pilar, DO [facility], [place] [facility] 10/12/2018, 4:43 PM

## 2018-10-12 NOTE — Patient Instructions (Signed)
YOUR CELEXA HAS BEEN REDUCED TO 10 MG DAILY. YOU CAN TAKE PROPRANOLOL 10 MG TWICE A DAY AS NEEDED FOR SEVERE ANXIETY/AGITATION

## 2018-10-12 NOTE — Assessment & Plan Note (Signed)
Improved TSH, now normalized on levothyroxine low dose Last TSH/Free T4 normal Likely metabolism more balanced, appetite improved, some normal wt gain  Plan Continue current Levothyroxine daily - refill 90 day supply Re-check TSH Free T4 in 4 months Follow-up

## 2018-10-13 ENCOUNTER — Encounter: Payer: Self-pay | Admitting: Psychiatry

## 2018-11-05 ENCOUNTER — Other Ambulatory Visit: Payer: Self-pay | Admitting: Family Medicine

## 2018-11-05 DIAGNOSIS — E039 Hypothyroidism, unspecified: Secondary | ICD-10-CM

## 2018-11-07 ENCOUNTER — Encounter: Payer: Self-pay | Admitting: Psychiatry

## 2018-11-07 ENCOUNTER — Other Ambulatory Visit: Payer: Self-pay

## 2018-11-07 ENCOUNTER — Ambulatory Visit: Payer: Medicare Other | Admitting: Psychiatry

## 2018-11-07 VITALS — BP 143/89 | HR 59 | Temp 97.1°F | Wt 170.2 lb

## 2018-11-07 DIAGNOSIS — G2401 Drug induced subacute dyskinesia: Secondary | ICD-10-CM

## 2018-11-07 DIAGNOSIS — F70 Mild intellectual disabilities: Secondary | ICD-10-CM | POA: Diagnosis not present

## 2018-11-07 DIAGNOSIS — F251 Schizoaffective disorder, depressive type: Secondary | ICD-10-CM | POA: Diagnosis not present

## 2018-11-07 MED ORDER — CITALOPRAM HYDROBROMIDE 10 MG PO TABS: 10.0000 mg | ORAL_TABLET | Freq: Every day | ORAL | 1 refills | Status: DC

## 2018-11-07 MED ORDER — VALBENAZINE TOSYLATE 80 MG PO CAPS: 80.0000 mg | ORAL_CAPSULE | Freq: Every day | ORAL | 1 refills | Status: DC

## 2018-11-07 MED ORDER — PROPRANOLOL HCL 10 MG PO TABS: 10.0000 mg | ORAL_TABLET | Freq: Two times a day (BID) | ORAL | 1 refills | Status: DC | PRN

## 2018-11-07 MED ORDER — TRAZODONE HCL 50 MG PO TABS: 25.0000 mg | ORAL_TABLET | Freq: Every evening | ORAL | 2 refills | Status: DC | PRN

## 2018-11-07 NOTE — Progress Notes (Signed)
BH MD OP Progress Note  11/07/2018 4:22 PM Andres [name]  MRN:  [id number]  Chief Complaint: ' I am here for follow up." Chief Complaint    Follow-up; Medication Refill     HPI: Andres [name] is a 52 year old Caucasian male who has a history of mood disorder, drug induced movement disorder, TD, hypothyroidism, mild intellectual disability, presented to the clinic today for a follow-up visit.  Patient presented along with his Sister Andres [name].  Patient today appeared to be pleasant.  He was able to answer questions appropriately.  He does appear to be hyperverbal however that is his baseline.  Patient reports he is tolerating medications well.  He is compliant with her medications as prescribed.  He denies any significant anxiety or irritability at this time.  He reports sleep is improved on the current medication regimen.  He denies any perceptual disturbances.  He is tolerating the Ingrezza well.  He denies any significant side effects.  He reports his TD as improving.  He looks forward to Thanksgiving holidays.  He reports he enjoys going to the day program 2 times a week-Mondays and Wednesdays   Visit Diagnosis:    ICD-10-CM   1. Schizoaffective disorder, depressive type (HCC) F25.1 traZODone (DESYREL) 50 MG tablet    citalopram (CELEXA) 10 MG tablet  2. Tardive dyskinesia G24.01 Valbenazine Tosylate (INGREZZA) 80 MG CAPS  3. Mild intellectual disability F70     Past Psychiatric History: I have reviewed past psychiatric history from my progress note on 03/29/2018.  Past Medical History:  Past Medical History:  Diagnosis Date  . Allergy     Past Surgical History:  Procedure Laterality Date  . ANKLE SURGERY    . COLON SURGERY    . HERNIA REPAIR    . TONSILLECTOMY      Family Psychiatric History: I have reviewed family psychiatric history from my progress note on 03/29/2018.  Family History:  Family History  Problem Relation Age of Onset  . Diabetes Mother   . Stroke Mother   .  Cancer Father        colon  . Colon cancer Father   . Colon cancer Other   . Mental illness Neg Hx     Social History: Reviewed social history from my progress note on 03/29/2018 Social History   Socioeconomic History  . Marital status: Single    Spouse name: Not on file  . Number of children: 0  . Years of education: [employer]  . Highest education level: High school graduate  Occupational History    Comment: disabled  Social Needs  . Financial resource strain: Not hard at all  . Food insecurity:    Worry: Never true    Inability: Never true  . Transportation needs:    Medical: Yes    Non-medical: Yes  Tobacco Use  . Smoking status: Never Smoker  . Smokeless tobacco: Never Used  Substance and Sexual Activity  . Alcohol use: No    Frequency: Never  . Drug use: No  . Sexual activity: Not Currently  Lifestyle  . Physical activity:    Days per week: 0 days    Minutes per session: 0 min  . Stress: Not at all  Relationships  . Social connections:    Talks on phone: More than three times a week    Gets together: More than three times a week    Attends religious service: Never    Active member of club or organization: No  Attends meetings of clubs or organizations: Never    Relationship status: Never married  Other Topics Concern  . Not on file  Social History Narrative  . Not on file    Allergies:  Allergies  Allergen Reactions  . Prednisone Other (See Comments)    Did not like the way it made him feel   . Lamotrigine Rash    Metabolic Disorder Labs: Lab Results  Component Value Date   HGBA1C 4.8 10/05/2018   MPG 91 10/05/2018   No results found for: PROLACTIN No results found for: CHOL, TRIG, HDL, CHOLHDL, VLDL, LDLCALC Lab Results  Component Value Date   TSH 2.69 10/05/2018    Therapeutic Level Labs: No results found for: LITHIUM No results found for: VALPROATE No components found for:  CBMZ  Current Medications: Current Outpatient  Medications  Medication Sig Dispense Refill  . allopurinol (ZYLOPRIM) 300 MG tablet Take 1 tablet (300 mg total) by mouth daily. 90 tablet 3  . cholecalciferol (VITAMIN D) 1000 units tablet Take 1,000 Units by mouth daily.    . fexofenadine (ALLEGRA) 180 MG tablet Take 180 mg by mouth.    . levothyroxine (SYNTHROID, LEVOTHROID) 25 MCG tablet TAKE 1 TABLET( 25 MCG TOTAL) BY MOUTH DAILY BEFORE BREAKFAST ON AN EMPTY STOMACH. NOT TO TAKE WITH OTHER MEDICINES AT SAME TIME 90 tablet 1  . propranolol (INDERAL) 10 MG tablet Take 1 tablet (10 mg total) by mouth 2 (two) times daily as needed. For severe anxiety/agitation 60 tablet 1  . QUEtiapine (SEROQUEL) 25 MG tablet Take 0.5 tablets (12.5 mg total) by mouth daily as needed. For severe anxiety/agitation 15 tablet 2  . QUEtiapine (SEROQUEL) 50 MG tablet Take 1 tablet (50 mg total) by mouth at bedtime. 90 tablet 1  . thiamine (VITAMIN B-1) 100 MG tablet Take 100 mg by mouth daily.    . traZODone (DESYREL) 50 MG tablet Take 0.5 tablets (25 mg total) by mouth at bedtime as needed for sleep. 45 tablet 2  . Valbenazine Tosylate (INGREZZA) 80 MG CAPS Take 80 mg by mouth at bedtime. 90 capsule 1  . citalopram (CELEXA) 10 MG tablet Take 1 tablet (10 mg total) by mouth daily. 90 tablet 1   No current facility-administered medications for this visit.      Musculoskeletal: Strength & Muscle Tone: within normal limits Gait & Station: normal Patient leans: N/A  Psychiatric Specialty Exam: Review of Systems  Psychiatric/Behavioral: Negative for depression. The patient is not nervous/anxious.   All other systems reviewed and are negative.   Blood pressure (!) 143/89, pulse (!) 59, temperature (!) 97.1 F (36.2 C), temperature source Tympanic, weight 170 lb 3.2 oz (77.2 kg).Body mass index is 27.89 kg/m.  General Appearance: Casual  Eye Contact:  Fair  Speech:  Clear and Coherent  Volume:  Normal  Mood:  Euthymic  Affect:  Congruent  Thought Process:   Goal Directed and Descriptions of Associations: Intact  Orientation:  Full (Time, Place, and Person)  Thought Content: Logical   Suicidal Thoughts:  No  Homicidal Thoughts:  No  Memory:  Immediate;   Fair Recent;   Fair Remote;   Fair  Judgement:  Fair  Insight:  Fair  Psychomotor Activity:  Normal  Concentration:  Concentration: Fair and Attention Span: Fair  Recall:  [employer] of Knowledge: Fair  Language: Fair  Akathisia:  No  Handed:  Right  AIMS (if indicated): 8 has TD - improving  Assets:  Communication Skills Desire for  Improvement Social Support  ADL's:  Intact  Cognition: baseline  Sleep:  improving   Screenings: AIMS     Office Visit from 11/07/2018 in [facility] Office Visit from 09/07/2018 in [facility] Office Visit from 05/31/2018 in [facility] Office Visit from 05/16/2018 in [facility] Office Visit from 04/25/2018 in [facility]  AIMS Total Score  8  8  8  9  6     GAD-7     Office Visit from 07/07/2018 in [facility]  Total GAD-7 Score  15    PHQ2-9     Office Visit from 07/07/2018 in [place]  PHQ-2 Total Score  2  PHQ-9 Total Score  10       Assessment and Plan: Andres [name] is a 52 year old Caucasian male who has a history of schizoaffective disorder, mild ID, TD, hypothyroidism, presented to the clinic today for a follow-up visit.  Patient is currently making progress on the current medication regimen.  Will continue plan as noted below.  Plan For schizoaffective disorder Continue Seroquel 50 mg p.o. nightly Trazodone 25-50 mg p.o. nightly as needed Celexa 10 mg p.o. daily Seroquel 12.5 mg p.o. daily as needed for severe anxiety or agitation. He also has propranolol 10 mg p.o. twice daily as needed for severe anxiety symptoms.    TD Ingrezza 80 mg po daily. AIMS - 8  Patient  has started attending day program.  He attends the program Mondays and Wednesdays.  He enjoys them.  Patient will continue Synthroid and we will continue to follow-up with PMD for the same.  Follow-up in clinic in 2 months or sooner if needed.  More than 50 % of the time was spent for psychoeducation and supportive psychotherapy and care coordination.  This note was generated in part or whole with voice recognition software. Voice recognition is usually quite accurate but there are transcription errors that can and very often do occur. I apologize for any typographical errors that were not detected and corrected.         Jomarie Longs, MD 11/07/2018, 4:22 PM

## 2018-12-29 ENCOUNTER — Ambulatory Visit: Payer: Medicare Other | Admitting: Psychiatry

## 2019-01-08 ENCOUNTER — Ambulatory Visit: Payer: Medicare Other | Admitting: Licensed Clinical Social Worker

## 2019-01-08 DIAGNOSIS — F79 Unspecified intellectual disabilities: Secondary | ICD-10-CM | POA: Diagnosis not present

## 2019-01-08 DIAGNOSIS — F251 Schizoaffective disorder, depressive type: Secondary | ICD-10-CM

## 2019-01-09 ENCOUNTER — Ambulatory Visit: Payer: Medicare Other | Admitting: Psychiatry

## 2019-01-11 ENCOUNTER — Ambulatory Visit: Payer: Medicare Other | Admitting: Psychiatry

## 2019-01-11 ENCOUNTER — Encounter: Payer: Self-pay | Admitting: Psychiatry

## 2019-01-11 ENCOUNTER — Other Ambulatory Visit: Payer: Self-pay

## 2019-01-11 VITALS — BP 116/75 | HR 61 | Temp 97.0°F | Wt 159.6 lb

## 2019-01-11 DIAGNOSIS — F70 Mild intellectual disabilities: Secondary | ICD-10-CM | POA: Diagnosis not present

## 2019-01-11 DIAGNOSIS — G2401 Drug induced subacute dyskinesia: Secondary | ICD-10-CM

## 2019-01-11 DIAGNOSIS — F251 Schizoaffective disorder, depressive type: Secondary | ICD-10-CM

## 2019-01-11 MED ORDER — QUETIAPINE FUMARATE 50 MG PO TABS: 50.0000 mg | ORAL_TABLET | Freq: Every day | ORAL | 1 refills | Status: DC

## 2019-01-11 NOTE — Progress Notes (Signed)
BH MD OP Progress Note  01/11/2019 12:17 PM Andres [name]  MRN:  [id number]  Chief Complaint: ' I am here for follow up.' Chief Complaint    Follow-up; Medication Refill     HPI: Andres [name] is a 53 year old Caucasian male who has a history of mood disorder, drug-induced movement disorder, TD, hypothyroidism, mild intellectual disability, presented to the clinic today for a follow-up visit.  Patient today presented with his sister who provided collateral information.  Patient reports he has been doing fair with regards to his mood symptoms.  He does have some ups and downs however has been coping better.  He has not had any agitations.  He has not had any episodes of irritability.  Patient denies any perceptual disturbances.  He reports sleep is improving.  He continues to take Seroquel and also has trazodone as needed.  He reports he goes to the day program-[facility] which has been helpful.  He continues to tolerate Ingrezza well.  His aims scale today seems to be improved.  Patient denies any suicidality.  Patient denies any other concerns today.  Per sister , 'patient has been making progress.  She continues to talk to the social worker about community resources available and has upcoming appointment scheduled'.    Visit Diagnosis:    ICD-10-CM   1. Schizoaffective disorder, depressive type (HCC) F25.1 QUEtiapine (SEROQUEL) 50 MG tablet  2. Tardive dyskinesia G24.01   3. Mild intellectual disability F70     Past Psychiatric History: Reviewed past psychiatric history from my progress note on 03/29/2018.  Past Medical History:  Past Medical History:  Diagnosis Date  . Allergy     Past Surgical History:  Procedure Laterality Date  . ANKLE SURGERY    . COLON SURGERY    . HERNIA REPAIR    . TONSILLECTOMY      Family Psychiatric History: I have reviewed family psychiatric history from my progress note on 03/29/2018.  Family History:  Family History  Problem Relation Age  of Onset  . Diabetes Mother   . Stroke Mother   . Cancer Father        colon  . Colon cancer Father   . Colon cancer Other   . Mental illness Neg Hx     Social History:  Social History   Socioeconomic History  . Marital status: Single    Spouse name: Not on file  . Number of children: 0  . Years of education: [employer]  . Highest education level: High school graduate  Occupational History    Comment: disabled  Social Needs  . Financial resource strain: Not hard at all  . Food insecurity:    Worry: Never true    Inability: Never true  . Transportation needs:    Medical: Yes    Non-medical: Yes  Tobacco Use  . Smoking status: Never Smoker  . Smokeless tobacco: Never Used  Substance and Sexual Activity  . Alcohol use: No    Frequency: Never  . Drug use: No  . Sexual activity: Not Currently  Lifestyle  . Physical activity:    Days per week: 0 days    Minutes per session: 0 min  . Stress: Not at all  Relationships  . Social connections:    Talks on phone: More than three times a week    Gets together: More than three times a week    Attends religious service: Never    Active member of club or organization: No  Attends meetings of clubs or organizations: Never    Relationship status: Never married  Other Topics Concern  . Not on file  Social History Narrative  . Not on file    Allergies:  Allergies  Allergen Reactions  . Prednisone Other (See Comments)    Did not like the way it made him feel   . Lamotrigine Rash    Metabolic Disorder Labs: Lab Results  Component Value Date   HGBA1C 4.8 10/05/2018   MPG 91 10/05/2018   No results found for: PROLACTIN No results found for: CHOL, TRIG, HDL, CHOLHDL, VLDL, LDLCALC Lab Results  Component Value Date   TSH 2.69 10/05/2018    Therapeutic Level Labs: No results found for: LITHIUM No results found for: VALPROATE No components found for:  CBMZ  Current Medications: Current Outpatient Medications   Medication Sig Dispense Refill  . allopurinol (ZYLOPRIM) 300 MG tablet Take 1 tablet (300 mg total) by mouth daily. 90 tablet 3  . cholecalciferol (VITAMIN D) 1000 units tablet Take 1,000 Units by mouth daily.    . citalopram (CELEXA) 10 MG tablet Take 1 tablet (10 mg total) by mouth daily. 90 tablet 1  . fexofenadine (ALLEGRA) 180 MG tablet Take 180 mg by mouth.    . levothyroxine (SYNTHROID, LEVOTHROID) 25 MCG tablet TAKE 1 TABLET( 25 MCG TOTAL) BY MOUTH DAILY BEFORE BREAKFAST ON AN EMPTY STOMACH. NOT TO TAKE WITH OTHER MEDICINES AT SAME TIME 90 tablet 1  . propranolol (INDERAL) 10 MG tablet Take 1 tablet (10 mg total) by mouth 2 (two) times daily as needed. For severe anxiety/agitation 60 tablet 1  . QUEtiapine (SEROQUEL) 25 MG tablet Take 0.5 tablets (12.5 mg total) by mouth daily as needed. For severe anxiety/agitation 15 tablet 2  . QUEtiapine (SEROQUEL) 50 MG tablet Take 1 tablet (50 mg total) by mouth at bedtime. 90 tablet 1  . thiamine (VITAMIN B-1) 100 MG tablet Take 100 mg by mouth daily.    . traZODone (DESYREL) 50 MG tablet Take 0.5 tablets (25 mg total) by mouth at bedtime as needed for sleep. 45 tablet 2  . Valbenazine Tosylate (INGREZZA) 80 MG CAPS Take 80 mg by mouth at bedtime. 90 capsule 1   No current facility-administered medications for this visit.      Musculoskeletal: Strength & Muscle Tone: within normal limits Gait & Station: normal Patient leans: N/A  Psychiatric Specialty Exam: Review of Systems  Psychiatric/Behavioral: Negative for depression. The patient is not nervous/anxious.   All other systems reviewed and are negative.   Blood pressure 116/75, pulse 61, temperature (!) 97 F (36.1 C), temperature source Oral, weight 159 lb 9.6 oz (72.4 kg).Body mass index is 26.15 kg/m.  General Appearance: Casual  Eye Contact:  Fair  Speech:  Normal Rate  Volume:  Normal  Mood:  Euthymic  Affect:  Congruent  Thought Process:  Goal Directed and Descriptions of  Associations: Intact  Orientation:  Full (Time, Place, and Person)  Thought Content: Logical   Suicidal Thoughts:  No  Homicidal Thoughts:  No  Memory:  Immediate;   Fair Recent;   Fair Remote;   Fair  Judgement:  Fair  Insight:  Fair  Psychomotor Activity:  Normal  Concentration:  Concentration: Fair and Attention Span: Fair  Recall:  [employer]  Fund of Knowledge: Fair  Language: Fair  Akathisia:  No  Handed:  Right  AIMS (if indicated): 5 , has TD  Assets:  Communication Skills Desire for Improvement Social Support  ADL's:  Intact  Cognition: WNL  Sleep:  Fair   Screenings: AIMS     Office Visit from 01/11/2019 in [facility] Office Visit from 11/07/2018 in [facility] Office Visit from 09/07/2018 in [facility] Office Visit from 05/31/2018 in [facility] Office Visit from 05/16/2018 in [facility]  AIMS Total Score  5  8  8  8  9     GAD-7     Office Visit from 07/07/2018 in [facility]  Total GAD-7 Score  15    PHQ2-9     Office Visit from 07/07/2018 in [place]  PHQ-2 Total Score  2  PHQ-9 Total Score  10       Assessment and Plan: Zackarey is a 53 year old Caucasian male who has a history of schizoaffective disorder, mild ID, TD, hypothyroidism, presented to the clinic today for a follow-up visit.  Patient continues to make progress on the current medication regimen.  He does have good social support system.  Collateral information was obtained from his Sister Andres [name].  Plan as noted below.  Plan For schizoaffective disorder-improving Seroquel 50 mg p.o. nightly Trazodone 25 to 50 mg p.o. nightly as needed Celexa 10 mg p.o. daily Seroquel 12.5 mg p.o. daily PRN for severe anxiety or agitation He also has propranolol 10 mg p.o. twice daily PRN for severe anxiety attacks.  TD- making progress [place]  today 5 Ingrezza 80 mg po daily.  Collateral information was obtained from Andres [name]-sister as summarized above.  Patient will continue attending day program.  He goes there Mondays and Wednesdays.  Follow-up in clinic in 3 months or sooner if needed.  I have spent atleast 15 minutes face to face with patient today. More than 50 % of the time was spent for psychoeducation and supportive psychotherapy and care coordination.  This note was generated in part or whole with voice recognition software. Voice recognition is usually quite accurate but there are transcription errors that can and very often do occur. I apologize for any typographical errors that were not detected and corrected.         Jomarie Longs, MD 01/11/2019, 12:17 PM

## 2019-01-12 NOTE — Progress Notes (Signed)
   THERAPIST PROGRESS NOTE  Session Time:  Participation Level: Active  Behavioral Response: CasualAlertEuthymic  Type of Therapy: Family Therapy  Treatment Goals addressed: Diagnosis: Schizoaffective  Interventions: Supportive  Summary: MASSIE SILENCE is a 53 y.o. male sister arrived at the session without Patient.  Sister provided information about Patient's background and her current worries about her brother.  Patient currently is dependent on his 74yr old father for care.  Sister inquired about resources in the community to assist with improving independence for her brother.  DIscussion of Day Programs and independent living homes.  Provided Patient with resource list to include number to [employer].   Suicidal/Homicidal: No  Plan: as needed  Diagnosis: Axis I: Schizoaffective Disorder    Axis II: Mental retardation, severity unknown    Marinda Elk, LCSW 01/08/2019

## 2019-02-06 ENCOUNTER — Other Ambulatory Visit: Payer: Self-pay

## 2019-02-06 DIAGNOSIS — E039 Hypothyroidism, unspecified: Secondary | ICD-10-CM

## 2019-02-07 LAB — T4, FREE: Free T4: 1.1 ng/dL (ref 0.8–1.8)

## 2019-02-07 LAB — TSH: TSH: 4.28 mIU/L (ref 0.40–4.50)

## 2019-02-13 ENCOUNTER — Ambulatory Visit: Payer: Self-pay | Admitting: Family Medicine

## 2019-02-19 ENCOUNTER — Other Ambulatory Visit: Payer: Self-pay | Admitting: Family Medicine

## 2019-02-19 ENCOUNTER — Ambulatory Visit (INDEPENDENT_AMBULATORY_CARE_PROVIDER_SITE_OTHER): Payer: Self-pay | Admitting: Family Medicine

## 2019-02-19 ENCOUNTER — Other Ambulatory Visit: Payer: Self-pay

## 2019-02-19 ENCOUNTER — Encounter: Payer: Self-pay | Admitting: Family Medicine

## 2019-02-19 VITALS — BP 114/69 | HR 61 | Resp 16 | Ht 65.5 in | Wt 160.1 lb

## 2019-02-19 DIAGNOSIS — R6250 Unspecified lack of expected normal physiological development in childhood: Secondary | ICD-10-CM

## 2019-02-19 DIAGNOSIS — I10 Essential (primary) hypertension: Secondary | ICD-10-CM

## 2019-02-19 DIAGNOSIS — E039 Hypothyroidism, unspecified: Secondary | ICD-10-CM

## 2019-02-19 DIAGNOSIS — E559 Vitamin D deficiency, unspecified: Secondary | ICD-10-CM

## 2019-02-19 DIAGNOSIS — Z79899 Other long term (current) drug therapy: Secondary | ICD-10-CM

## 2019-02-19 DIAGNOSIS — M1A9XX1 Chronic gout, unspecified, with tophus (tophi): Secondary | ICD-10-CM

## 2019-02-19 DIAGNOSIS — Z125 Encounter for screening for malignant neoplasm of prostate: Secondary | ICD-10-CM

## 2019-02-19 DIAGNOSIS — Z0000 Encounter for general adult medical examination without abnormal findings: Secondary | ICD-10-CM

## 2019-02-19 DIAGNOSIS — F259 Schizoaffective disorder, unspecified: Secondary | ICD-10-CM

## 2019-02-19 NOTE — Patient Instructions (Addendum)
Thank you for coming to the office today.  Keep up the great work.  Thyroid result is normal.  We will request copy of colonoscopy and review this at next visit.  DUE for FASTING BLOOD WORK (no food or drink after midnight before the lab appointment, only water or coffee without cream/sugar on the morning of)  SCHEDULE "Lab Only" visit in the morning at the clinic for lab draw in 7 MONTHS   - Make sure Lab Only appointment is at about 1 week before your next appointment, so that results will be available  For Lab Results, once available within 2-3 days of blood draw, you can can log in to MyChart online to view your results and a brief explanation. Also, we can discuss results at next follow-up visit.   Please schedule a Follow-up Appointment to: Return in about 7 months (around 09/20/2019) for Annual Physical.  If you have any other questions or concerns, please feel free to call the office or send a message through MyChart. You may also schedule an earlier appointment if necessary.  Additionally, you may be receiving a survey about your experience at our office within a few days to 1 week by e-mail or mail. We value your feedback.  Saralyn Pilar, DO [facility] [place]

## 2019-02-19 NOTE — Assessment & Plan Note (Signed)
Controlled HTN off medication currently Not checking BP regularly    Plan:  1. Remain off meds for now 2. Encourage improved lifestyle - low sodium diet, regular exercise 3. May try to monitor BP outside office, bring readings to next visit, if persistently >140/90 or new symptoms notify office sooner 

## 2019-02-19 NOTE — Progress Notes (Signed)
Subjective:    Patient ID: Andres [name], male    DOB: 1966-04-11, 53 y.o.   MRN: [id number]  Andres [name] is a 53 y.o. male presenting on 02/19/2019 for Hypothyroidism   HPI   FOLLOW-UP Hypothyroidism / Weight Loss Known history of elevated TSH result TSH 9.345 (06/02/18) - and multiple other results 7 to 9 until recently, he was started on Levothyroxine daily  - Interval lab shows TSH 4.28 and normal Free T4 now. - Today doing well, no new concerns, he is staying active, appetite is good, food choice improved, his weight is down 10-13 lbs, but overall still up from July. No sweets and sodas. Admits some edema Admits wt gain - Denies any new complaints, abnormal temperature changes, nausea vomiting, fever chills sweats  CHRONIC HTN: Reports doing well, normal BP Current Meds - no regular meds   Denies CP, dyspnea, HA, edema, dizziness / lightheadedness   Health Maintenance:  Last colonoscopy done by [facility] 06/24/15, do not have external result available at this time, family does not recall any problem identified they believe there was no polyp found.  Depression screen [facility] 2/9 02/19/2019 07/07/2018  Decreased Interest 0 1  Down, Depressed, Hopeless 0 1  PHQ - 2 Score 0 2  Altered sleeping - 2  Tired, decreased energy - 2  Change in appetite - 0  Feeling bad or failure about yourself  - 1  Trouble concentrating - 2  Moving slowly or fidgety/restless - 1  Suicidal thoughts - 0  PHQ-9 Score - 10  Difficult doing work/chores - Somewhat difficult    Social History   Tobacco Use  . Smoking status: Never Smoker  . Smokeless tobacco: Never Used  Substance Use Topics  . Alcohol use: No    Frequency: Never  . Drug use: No    Review of Systems Per HPI unless specifically indicated above     Objective:    BP 114/69   Pulse 61   Resp 16   Ht 5' 5.5" (1.664 m)   Wt 160 lb 1.6 oz (72.6 kg)   SpO2 100%   BMI 26.24 kg/m   Wt Readings from Last 3  Encounters:  02/19/19 160 lb 1.6 oz (72.6 kg)  10/12/18 173 lb (78.5 kg)  07/07/18 156 lb 3.2 oz (70.9 kg)    Physical Exam Vitals signs and nursing note reviewed.  Constitutional:      General: He is not in acute distress.    Appearance: He is well-developed. He is not diaphoretic.     Comments: Well-appearing, comfortable, cooperative  HENT:     Head: Normocephalic and atraumatic.  Eyes:     General:        Right eye: No discharge.        Left eye: No discharge.     Conjunctiva/sclera: Conjunctivae normal.  Neck:     Musculoskeletal: Normal range of motion and neck supple.     Thyroid: No thyromegaly.  Cardiovascular:     Rate and Rhythm: Normal rate and regular rhythm.     Heart sounds: Normal heart sounds. No murmur.  Pulmonary:     Effort: Pulmonary effort is normal. No respiratory distress.     Breath sounds: Normal breath sounds. No wheezing or rales.  Musculoskeletal: Normal range of motion.  Lymphadenopathy:     Cervical: No cervical adenopathy.  Skin:    General: Skin is warm and dry.     Findings: No  erythema or rash.  Neurological:     Mental Status: He is alert and oriented to person, place, and time.  Psychiatric:        Behavior: Behavior normal.     Comments: Well groomed, good eye contact, normal speech and thoughts    Results for orders placed or performed in visit on 02/06/19  T4, free  Result Value Ref Range   Free T4 1.1 0.8 - 1.8 ng/dL  TSH  Result Value Ref Range   TSH 4.28 0.40 - 4.50 mIU/L      Assessment & Plan:   Problem List Items Addressed This Visit    Essential hypertension - Primary    Controlled HTN off medication currently Not checking BP regularly    Plan:  1. Remain off meds for now 2. Encourage improved lifestyle - low sodium diet, regular exercise 3. May try to monitor BP outside office, bring readings to next visit, if persistently >140/90 or new symptoms notify office sooner      Hypothyroidism    Stable,  controlled now normalized on levothyroxine low dose Last TSH/Free T4 normal 01/2019 - slightly higher TSH but still in 4 range Likely metabolism more balanced, appetite improved, some normal wt gain  Plan Continue current Levothyroxine daily - no dose change Re-check TSH Free T4 in 7 months at annual Follow-up         No orders of the defined types were placed in this encounter.   Follow up plan: Return in about 7 months (around 09/20/2019) for Annual Physical.  Future labs ordered for 09/24/19  Saralyn Pilar, DO [facility] 02/19/2019, 8:37 AM

## 2019-02-19 NOTE — Assessment & Plan Note (Signed)
Stable, controlled now normalized on levothyroxine low dose Last TSH/Free T4 normal 01/2019 - slightly higher TSH but still in 4 range Likely metabolism more balanced, appetite improved, some normal wt gain  Plan Continue current Levothyroxine daily - no dose change Re-check TSH Free T4 in 7 months at annual Follow-up

## 2019-02-20 ENCOUNTER — Ambulatory Visit: Payer: Medicare Other | Admitting: Licensed Clinical Social Worker

## 2019-03-08 ENCOUNTER — Encounter: Payer: Self-pay | Admitting: Family Medicine

## 2019-04-10 ENCOUNTER — Ambulatory Visit (INDEPENDENT_AMBULATORY_CARE_PROVIDER_SITE_OTHER): Payer: Medicare Other | Admitting: Psychiatry

## 2019-04-10 ENCOUNTER — Other Ambulatory Visit: Payer: Self-pay

## 2019-04-10 ENCOUNTER — Encounter: Payer: Self-pay | Admitting: Psychiatry

## 2019-04-10 DIAGNOSIS — F251 Schizoaffective disorder, depressive type: Secondary | ICD-10-CM | POA: Diagnosis not present

## 2019-04-10 DIAGNOSIS — G2401 Drug induced subacute dyskinesia: Secondary | ICD-10-CM

## 2019-04-10 DIAGNOSIS — F70 Mild intellectual disabilities: Secondary | ICD-10-CM | POA: Diagnosis not present

## 2019-04-10 NOTE — Progress Notes (Signed)
Virtual Visit via Video Note  I connected with Diannia RuderBarry W Kelson on 04/10/19 at 11:30 AM EDT by a video enabled telemedicine application and verified that I am speaking with the correct person using two identifiers.   I discussed the limitations of evaluation and management by telemedicine and the availability of in person appointments. The patient expressed understanding and agreed to proceed.    I discussed the assessment and treatment plan with the patient. The patient was provided an opportunity to ask questions and all were answered. The patient agreed with the plan and demonstrated an understanding of the instructions.   The patient was advised to call back or seek an in-person evaluation if the symptoms worsen or if the condition fails to improve as anticipated.   BH MD OP Progress Note  04/10/2019 12:44 PM Ernestina PennaBarry W Hammerstrom  MRN:  [id number]  Chief Complaint:  Chief Complaint    Follow-up; Medication Refill     HPI: [name] is a 53 year old Caucasian male who has a history of schizoaffective disorder, tardive dyskinesia, hypothyroidism, mild intellectual disability was evaluated by telemedicine today.  Patient's sister-[name] as well as father provided collateral information.  Patient today reports he is doing well.  He appears to be alert, oriented to person place situation.  He is aware about the COVID-19 outbreak.  He is aware that he is not able to go out much anymore.  He has been coping okay so far.  He reports he has a housekeeping person-Kathy who comes in 3 times a week.  He spends time playing board games with her.  He also has been going out for brief walks.  His family has been doing grocery shopping for him.  He denies any significant agitation.  He reports he is compliant with his medications.  He denies any side effects to the medications.  He denies any perceptual disturbances.  He has not been able to go for the day program-at [facility] due to the  lockdown.  Patient continues to sleep well.  He reports his appetite is fair.  He continues to be compliant with his Ingrezza and reports his movement disorder is improving.  Visit Diagnosis:    ICD-10-CM   1. Schizoaffective disorder, depressive type (HCC) F25.1   2. Tardive dyskinesia G24.01   3. Mild intellectual disability F70     Past Psychiatric History: I have reviewed past psychiatric history from my progress note on 03/29/2018  Past Medical History:  Past Medical History:  Diagnosis Date  . Allergy     Past Surgical History:  Procedure Laterality Date  . ANKLE SURGERY    . COLON SURGERY    . HERNIA REPAIR    . TONSILLECTOMY      Family Psychiatric History: Reviewed family psychiatric history from my progress note on 03/29/2018.  Family History:  Family History  Problem Relation Age of Onset  . Diabetes Mother   . Stroke Mother   . Cancer Father        colon  . Colon cancer Father   . Colon cancer Other   . Mental illness Neg Hx     Social History: Reviewed social history from my progress note on 03/29/2018 Social History   Socioeconomic History  . Marital status: Single    Spouse name: Not on file  . Number of children: 0  . Years of education: [employer]  . Highest education level: High school graduate  Occupational History    Comment: disabled  Social  Needs  . Financial resource strain: Not hard at all  . Food insecurity:    Worry: Never true    Inability: Never true  . Transportation needs:    Medical: Yes    Non-medical: Yes  Tobacco Use  . Smoking status: Never Smoker  . Smokeless tobacco: Never Used  Substance and Sexual Activity  . Alcohol use: No    Frequency: Never  . Drug use: No  . Sexual activity: Not Currently  Lifestyle  . Physical activity:    Days per week: 0 days    Minutes per session: 0 min  . Stress: Not at all  Relationships  . Social connections:    Talks on phone: More than three times a week    Gets together:  More than three times a week    Attends religious service: Never    Active member of club or organization: No    Attends meetings of clubs or organizations: Never    Relationship status: Never married  Other Topics Concern  . Not on file  Social History Narrative  . Not on file    Allergies:  Allergies  Allergen Reactions  . Prednisone Other (See Comments)    Did not like the way it made him feel   . Lamotrigine Rash    Metabolic Disorder Labs: Lab Results  Component Value Date   HGBA1C 4.8 10/05/2018   MPG 91 10/05/2018   No results found for: PROLACTIN No results found for: CHOL, TRIG, HDL, CHOLHDL, VLDL, LDLCALC Lab Results  Component Value Date   TSH 4.28 02/06/2019   TSH 2.69 10/05/2018    Therapeutic Level Labs: No results found for: LITHIUM No results found for: VALPROATE No components found for:  CBMZ  Current Medications: Current Outpatient Medications  Medication Sig Dispense Refill  . allopurinol (ZYLOPRIM) 300 MG tablet Take 1 tablet (300 mg total) by mouth daily. 90 tablet 3  . cholecalciferol (VITAMIN D) 1000 units tablet Take 1,000 Units by mouth daily.    . citalopram (CELEXA) 10 MG tablet Take 1 tablet (10 mg total) by mouth daily. 90 tablet 1  . fexofenadine (ALLEGRA) 180 MG tablet Take 180 mg by mouth.    . levothyroxine (SYNTHROID, LEVOTHROID) 25 MCG tablet TAKE 1 TABLET( 25 MCG TOTAL) BY MOUTH DAILY BEFORE BREAKFAST ON AN EMPTY STOMACH. NOT TO TAKE WITH OTHER MEDICINES AT SAME TIME 90 tablet 1  . propranolol (INDERAL) 10 MG tablet Take 1 tablet (10 mg total) by mouth 2 (two) times daily as needed. For severe anxiety/agitation (Patient taking differently: Take 10 mg by mouth 2 (two) times daily as needed. For severe anxiety/agitation DR EAPEN) 60 tablet 1  . QUEtiapine (SEROQUEL) 25 MG tablet Take 0.5 tablets (12.5 mg total) by mouth daily as needed. For severe anxiety/agitation (Patient taking differently: Take 12.5 mg by mouth daily as needed. For  severe anxiety/agitationDR EAPEN) 15 tablet 2  . thiamine (VITAMIN B-1) 100 MG tablet Take 100 mg by mouth daily.    . traZODone (DESYREL) 50 MG tablet Take 0.5 tablets (25 mg total) by mouth at bedtime as needed for sleep. 45 tablet 2  . Valbenazine Tosylate (INGREZZA) 80 MG CAPS Take 80 mg by mouth at bedtime. 90 capsule 1   No current facility-administered medications for this visit.      Musculoskeletal: Strength & Muscle Tone: UTA Gait & Station: UTA Patient leans: N/A  Psychiatric Specialty Exam: Review of Systems  Psychiatric/Behavioral: The patient is nervous/anxious.  All other systems reviewed and are negative.   There were no vitals taken for this visit.There is no height or weight on file to calculate BMI.  General Appearance: Casual  Eye Contact:  Good  Speech:  Normal Rate  Volume:  Normal  Mood:  Anxious improving  Affect:  Congruent  Thought Process:  Goal Directed and Descriptions of Associations: Intact  Orientation:  Full (Time, Place, and Person)  Thought Content: Logical   Suicidal Thoughts:  No  Homicidal Thoughts:  No  Memory:  Immediate;   Fair Recent;   Fair Remote;   Fair  Judgement:  Fair  Insight:  Fair  Psychomotor Activity:  Normal  Concentration:  Concentration: Fair and Attention Span: Fair  Recall:  [employer] of Knowledge: Fair  Language: Fair  Akathisia:  No  Handed:  Right  AIMS (if indicated): Has TD- chronic   Assets:  Communication Skills Desire for Improvement Social Support  ADL's:  Intact  Cognition: WNL  Sleep:  Fair   Screenings: AIMS     Office Visit from 01/11/2019 in [facility] Office Visit from 11/07/2018 in [facility] Office Visit from 09/07/2018 in [facility] Office Visit from 05/31/2018 in [facility] Office Visit from 05/16/2018 in [facility]  AIMS Total Score  GAD-7     Office Visit from 07/07/2018 in [facility]  Total GAD-7 Score  15    PHQ2-9     Office Visit from 02/19/2019 in [facility] Office Visit from 07/07/2018 in [facility]  PHQ-2 Total Score  0  2  PHQ-9 Total Score  -  10       Assessment and Plan: Antwaun is a 53 year old Caucasian male who has a history of schizoaffective disorder, mild ID, TD, hypothyroidism was evaluated by telemedicine today.  Patient continues to make progress on the current medication regimen.  Patient has good social support system.  Collateral information was obtained from his Sister Lupita Leash as well as father.  Plan as noted below.  Plan Schizoaffective disorder-stable Seroquel 50 mg p.o. nightly Trazodone 25 to 50 mg p.o. nightly as needed Celexa 10 mg p.o. daily Seroquel 12.5 mg p.o. daily PRN for severe anxiety or agitation Propranolol 10 mg p.o. twice daily PRN for severe anxiety attacks.  He has been using it very rarely.  TD-making progress Ingrezza 80 mg p.o. daily  Collateral information was obtained from Lupita Leash- as well as father.  Follow-up in clinic in 2 months or sooner if needed.  I have spent atleast 15 minutes non face to face with patient today. More than 50 % of the time was spent for psychoeducation and supportive psychotherapy and care coordination.  This note was generated in part or whole with voice recognition software. Voice recognition is usually quite accurate but there are transcription errors that can and very often do occur. I apologize for any typographical errors that were not detected and corrected.       Jomarie Longs, MD 04/10/2019, 12:44 PM

## 2019-04-10 NOTE — Progress Notes (Signed)
TC on 04-10-19 @ 10:25 pt medical and surgical hx were reviewed with no changes.  Allergies were reviewed with no new changes. Pt medications and pharmacy were reviewed with no changes. No vitals taken due to this is a phone consult.

## 2019-05-04 ENCOUNTER — Other Ambulatory Visit: Payer: Self-pay | Admitting: Family Medicine

## 2019-05-04 DIAGNOSIS — E039 Hypothyroidism, unspecified: Secondary | ICD-10-CM

## 2019-06-13 ENCOUNTER — Ambulatory Visit (INDEPENDENT_AMBULATORY_CARE_PROVIDER_SITE_OTHER): Payer: Medicare Other | Admitting: Psychiatry

## 2019-06-13 ENCOUNTER — Encounter: Payer: Self-pay | Admitting: Psychiatry

## 2019-06-13 ENCOUNTER — Other Ambulatory Visit: Payer: Self-pay

## 2019-06-13 DIAGNOSIS — F251 Schizoaffective disorder, depressive type: Secondary | ICD-10-CM | POA: Diagnosis not present

## 2019-06-13 DIAGNOSIS — G2401 Drug induced subacute dyskinesia: Secondary | ICD-10-CM | POA: Diagnosis not present

## 2019-06-13 DIAGNOSIS — F70 Mild intellectual disabilities: Secondary | ICD-10-CM

## 2019-06-13 MED ORDER — TRAZODONE HCL 50 MG PO TABS: 25.0000 mg | ORAL_TABLET | Freq: Every evening | ORAL | 2 refills | Status: DC | PRN

## 2019-06-13 MED ORDER — QUETIAPINE FUMARATE 50 MG PO TABS: 50.0000 mg | ORAL_TABLET | Freq: Every day | ORAL | 2 refills | Status: DC

## 2019-06-13 MED ORDER — PROPRANOLOL HCL 10 MG PO TABS: 10.0000 mg | ORAL_TABLET | Freq: Two times a day (BID) | ORAL | 2 refills | Status: DC | PRN

## 2019-06-13 MED ORDER — CITALOPRAM HYDROBROMIDE 10 MG PO TABS: 10.0000 mg | ORAL_TABLET | Freq: Every day | ORAL | 2 refills | Status: DC

## 2019-06-13 NOTE — Progress Notes (Signed)
Virtual Visit via Video Note  I connected with Andres [name] on 06/13/19 at  1:30 PM EDT by a video enabled telemedicine application and verified that I am speaking with the correct person using two identifiers.   I discussed the limitations of evaluation and management by telemedicine and the availability of in person appointments. The patient expressed understanding and agreed to proceed.   I discussed the assessment and treatment plan with the patient. The patient was provided an opportunity to ask questions and all were answered. The patient agreed with the plan and demonstrated an understanding of the instructions.   The patient was advised to call back or seek an in-person evaluation if the symptoms worsen or if the condition fails to improve as anticipated.   BH MD OP Progress Note  06/13/2019 5:25 PM Andres [name]  MRN:  [id number]  Chief Complaint:  Chief Complaint    Follow-up     HPI: Andres [name] is a 53 year old Caucasian male who has a history of schizoaffective disorder, tardive dyskinesia, hypothyroidism, mild intellectual disability was evaluated by telemedicine today.  Patient's Sister [name] provided collateral information.  Patient today reports he is currently doing well on the current medication regimen.  He denies any significant mood lability, anxiety or agitation.  He reports sleep is good.  Patient denies any suicidality, homicidality or perceptual disturbances.  Patient appeared to be alert, oriented to person place and situation.  Per Lupita Leashonna patient has been compliant with his medications.  He has been able to fill his pillbox by himself and has been observed is doing well.  He has a caregiver coming in 3 days a week and that has been helpful.  The caregiver takes him out for walks and also to the grocery stores.  He does not have the day program at [facility] at this time due to the COVID-19 pandemic.   Visit Diagnosis:    ICD-10-CM   1. Schizoaffective  disorder, depressive type (HCC)  F25.1 citalopram (CELEXA) 10 MG tablet    QUEtiapine (SEROQUEL) 50 MG tablet    propranolol (INDERAL) 10 MG tablet    traZODone (DESYREL) 50 MG tablet  2. Tardive dyskinesia  G24.01   3. Mild intellectual disability  F70     Past Psychiatric History: I have reviewed past psychiatric history from my progress note on 03/29/2018.  Past Medical History:  Past Medical History:  Diagnosis Date  . Allergy     Past Surgical History:  Procedure Laterality Date  . ANKLE SURGERY    . COLON SURGERY    . HERNIA REPAIR    . TONSILLECTOMY      Family Psychiatric History: Reviewed family psychiatric history from my progress note on 03/29/2018  Family History:  Family History  Problem Relation Age of Onset  . Diabetes Mother   . Stroke Mother   . Cancer Father        colon  . Colon cancer Father   . Colon cancer Other   . Mental illness Neg Hx     Social History: Reviewed social history from my progress note on 03/29/2018 Social History   Socioeconomic History  . Marital status: Single    Spouse name: Not on file  . Number of children: 0  . Years of education: [employer]  . Highest education level: High school graduate  Occupational History    Comment: disabled  Social Needs  . Financial resource strain: Not hard at all  . Food insecurity  Worry: Never true    Inability: Never true  . Transportation needs    Medical: Yes    Non-medical: Yes  Tobacco Use  . Smoking status: Never Smoker  . Smokeless tobacco: Never Used  Substance and Sexual Activity  . Alcohol use: No    Frequency: Never  . Drug use: No  . Sexual activity: Not Currently  Lifestyle  . Physical activity    Days per week: 0 days    Minutes per session: 0 min  . Stress: Not at all  Relationships  . Social connections    Talks on phone: More than three times a week    Gets together: More than three times a week    Attends religious service: Never    Active member of club  or organization: No    Attends meetings of clubs or organizations: Never    Relationship status: Never married  Other Topics Concern  . Not on file  Social History Narrative  . Not on file    Allergies:  Allergies  Allergen Reactions  . Prednisone Other (See Comments)    Did not like the way it made him feel   . Lamotrigine Rash    Metabolic Disorder Labs: Lab Results  Component Value Date   HGBA1C 4.8 10/05/2018   MPG 91 10/05/2018   No results found for: PROLACTIN No results found for: CHOL, TRIG, HDL, CHOLHDL, VLDL, LDLCALC Lab Results  Component Value Date   TSH 4.28 02/06/2019   TSH 2.69 10/05/2018    Therapeutic Level Labs: No results found for: LITHIUM No results found for: VALPROATE No components found for:  CBMZ  Current Medications: Current Outpatient Medications  Medication Sig Dispense Refill  . allopurinol (ZYLOPRIM) 300 MG tablet Take 1 tablet (300 mg total) by mouth daily. 90 tablet 3  . cholecalciferol (VITAMIN D) 1000 units tablet Take 1,000 Units by mouth daily.    . citalopram (CELEXA) 10 MG tablet Take 1 tablet (10 mg total) by mouth daily. 90 tablet 2  . fexofenadine (ALLEGRA) 180 MG tablet Take 180 mg by mouth.    . levothyroxine (SYNTHROID) 25 MCG tablet TAKE 1 TABLET BY MOUTH DAILY BEFORE BREAKFAST. ON EMPTY STOMACH, NOT TO TAKE WITH OTHER MEDICINES 90 tablet 1  . propranolol (INDERAL) 10 MG tablet Take 1 tablet (10 mg total) by mouth 2 (two) times daily as needed. For severe anxiety/agitation 180 tablet 2  . QUEtiapine (SEROQUEL) 25 MG tablet Take 0.5 tablets (12.5 mg total) by mouth daily as needed. For severe anxiety/agitation (Patient taking differently: Take 12.5 mg by mouth daily as needed. For severe anxiety/agitationDR EAPEN) 15 tablet 2  . QUEtiapine (SEROQUEL) 50 MG tablet Take 1 tablet (50 mg total) by mouth at bedtime. 90 tablet 2  . thiamine (VITAMIN B-1) 100 MG tablet Take 100 mg by mouth daily.    . traZODone (DESYREL) 50 MG  tablet Take 0.5 tablets (25 mg total) by mouth at bedtime as needed for sleep. 135 tablet 2  . Valbenazine Tosylate (INGREZZA) 80 MG CAPS Take 80 mg by mouth at bedtime. 90 capsule 1   No current facility-administered medications for this visit.      Musculoskeletal: Strength & Muscle Tone: within normal limits Gait & Station: normal Patient leans: N/A  Psychiatric Specialty Exam: Review of Systems  Neurological: Positive for tremors.  Psychiatric/Behavioral: The patient is not nervous/anxious.   All other systems reviewed and are negative.   There were no vitals taken for this  visit.There is no height or weight on file to calculate BMI.  General Appearance: Casual  Eye Contact:  Fair  Speech:  Clear and Coherent  Volume:  Normal  Mood:  Euthymic  Affect:  Congruent  Thought Process:  Goal Directed and Descriptions of Associations: Intact  Orientation:  Full (Time, Place, and Person)  Thought Content: Logical   Suicidal Thoughts:  No  Homicidal Thoughts:  No  Memory:  Immediate;   Fair Recent;   Fair Remote;   Fair  Judgement:  Fair  Insight:  Fair  Psychomotor Activity:  Normal  Concentration:  Concentration: Fair and Attention Span: Fair  Recall:  [employer]  Fund of Knowledge: Fair  Language: Fair  Akathisia:  No  Handed:  Right  AIMS (if indicated): UTA - reports tremors of BL hands as improved  Assets:  Communication Skills Desire for Improvement Housing Social Support  ADL's:  Intact  Cognition: WNL  Sleep:  Fair   Screenings: AIMS     [facility] from [facility] Office Visit from [facility]16/2018 [facility]  AIMS Total Score  5  8  8  8  9     GAD-7     Office Visit from  07/07/2018 in [facility]  Total GAD-7 Score  15    PHQ2-9     [facility] from [facility]  PHQ-2 Total Score  0  2  PHQ-9 Total Score  -  10       Assessment and Plan: Andres [name] is a 53 year old  male who has a history of schizoaffective disorder, mild ID, TD, hypothyroidism was evaluated by telemedicine today.  Patient continues to make progress on the current medication regimen.  Patient continues to have good support system.  Plan as noted below.  Plan Schizoaffective disorder-stable Seroquel 50 mg p.o. nightly Trazodone 25 to 50 mg p.o. nightly as needed Celexa 10 mg p.o. daily Seroquel 12.5 mg p.o. daily PRN for severe anxiety attacks Propranolol 10 mg p.o. twice daily as needed for anxiety and agitation.  TD- improving Ingrezza 80 mg p.o. daily  Collateral information was obtained from [name]-sister.  Follow-up in clinic in 2 to 3 months or sooner if needed.  September 23 at 10:30 AM  I have spent atleast 15 minutes non face to face with patient today. More than 50 % of the time was spent for psychoeducation and supportive psychotherapy and care coordination.  This note was generated in part or whole with voice recognition software. Voice recognition is usually quite accurate but there are transcription errors that can and very often do occur. I apologize for any typographical errors that were not detected and corrected.        Jomarie LongsSaramma Toussaint Golson, MD 06/13/2019, 5:25 PM

## 2019-06-17 ENCOUNTER — Other Ambulatory Visit: Payer: Self-pay

## 2019-06-17 ENCOUNTER — Inpatient Hospital Stay
Admission: EM | Admit: 2019-06-17 | Discharge: 2019-06-21 | DRG: 482 | Disposition: A | Discharge status: Skilled Nursing Facility | Payer: Medicare Other | Attending: Internal Medicine | Admitting: Internal Medicine

## 2019-06-17 ENCOUNTER — Emergency Department: Payer: Medicare Other

## 2019-06-17 DIAGNOSIS — Z833 Family history of diabetes mellitus: Secondary | ICD-10-CM

## 2019-06-17 DIAGNOSIS — F39 Unspecified mood [affective] disorder: Secondary | ICD-10-CM | POA: Diagnosis present

## 2019-06-17 DIAGNOSIS — D696 Thrombocytopenia, unspecified: Secondary | ICD-10-CM | POA: Diagnosis present

## 2019-06-17 DIAGNOSIS — Z79899 Other long term (current) drug therapy: Secondary | ICD-10-CM

## 2019-06-17 DIAGNOSIS — F79 Unspecified intellectual disabilities: Secondary | ICD-10-CM | POA: Diagnosis present

## 2019-06-17 DIAGNOSIS — S72091A Other fracture of head and neck of right femur, initial encounter for closed fracture: Secondary | ICD-10-CM | POA: Diagnosis present

## 2019-06-17 DIAGNOSIS — Z823 Family history of stroke: Secondary | ICD-10-CM

## 2019-06-17 DIAGNOSIS — K5900 Constipation, unspecified: Secondary | ICD-10-CM | POA: Diagnosis present

## 2019-06-17 DIAGNOSIS — W19XXXA Unspecified fall, initial encounter: Secondary | ICD-10-CM | POA: Diagnosis present

## 2019-06-17 DIAGNOSIS — E039 Hypothyroidism, unspecified: Secondary | ICD-10-CM | POA: Diagnosis present

## 2019-06-17 DIAGNOSIS — S72141A Displaced intertrochanteric fracture of right femur, initial encounter for closed fracture: Secondary | ICD-10-CM | POA: Diagnosis present

## 2019-06-17 DIAGNOSIS — R251 Tremor, unspecified: Secondary | ICD-10-CM | POA: Diagnosis present

## 2019-06-17 DIAGNOSIS — F329 Major depressive disorder, single episode, unspecified: Secondary | ICD-10-CM | POA: Diagnosis present

## 2019-06-17 DIAGNOSIS — Z800 Family history of malignant neoplasm of digestive organs: Secondary | ICD-10-CM | POA: Diagnosis not present

## 2019-06-17 DIAGNOSIS — Z419 Encounter for procedure for purposes other than remedying health state, unspecified: Secondary | ICD-10-CM

## 2019-06-17 DIAGNOSIS — R600 Localized edema: Secondary | ICD-10-CM | POA: Diagnosis present

## 2019-06-17 DIAGNOSIS — Z01811 Encounter for preprocedural respiratory examination: Secondary | ICD-10-CM

## 2019-06-17 DIAGNOSIS — Z1159 Encounter for screening for other viral diseases: Secondary | ICD-10-CM

## 2019-06-17 DIAGNOSIS — Z79890 Hormone replacement therapy (postmenopausal): Secondary | ICD-10-CM | POA: Diagnosis not present

## 2019-06-17 DIAGNOSIS — S72001A Fracture of unspecified part of neck of right femur, initial encounter for closed fracture: Secondary | ICD-10-CM

## 2019-06-17 LAB — CBC WITH DIFFERENTIAL/PLATELET
Abs Immature Granulocytes: 0.03 10*3/uL (ref 0.00–0.07)
Basophils Absolute: 0 10*3/uL (ref 0.0–0.1)
Basophils Relative: 0 %
Eosinophils Absolute: 0 10*3/uL (ref 0.0–0.5)
Eosinophils Relative: 0 %
HCT: 41.7 % (ref 39.0–52.0)
Hemoglobin: 14 g/dL (ref 13.0–17.0)
Immature Granulocytes: 0 %
Lymphocytes Relative: 12 %
Lymphs Abs: 0.9 10*3/uL (ref 0.7–4.0)
MCH: 30.8 pg (ref 26.0–34.0)
MCHC: 33.6 g/dL (ref 30.0–36.0)
MCV: 91.6 fL (ref 80.0–100.0)
Monocytes Absolute: 1 10*3/uL (ref 0.1–1.0)
Monocytes Relative: 14 %
Neutro Abs: 5.4 10*3/uL (ref 1.7–7.7)
Neutrophils Relative %: 74 %
Platelets: 103 10*3/uL — ABNORMAL LOW (ref 150–400)
RBC: 4.55 MIL/uL (ref 4.22–5.81)
RDW: 13.6 % (ref 11.5–15.5)
WBC: 7.4 10*3/uL (ref 4.0–10.5)
nRBC: 0 % (ref 0.0–0.2)

## 2019-06-17 LAB — COMPREHENSIVE METABOLIC PANEL
ALT: 27 U/L (ref 0–44)
AST: 31 U/L (ref 15–41)
Albumin: 3.8 g/dL (ref 3.5–5.0)
Alkaline Phosphatase: 56 U/L (ref 38–126)
Anion gap: 12 (ref 5–15)
BUN: 21 mg/dL — ABNORMAL HIGH (ref 6–20)
CO2: 20 mmol/L — ABNORMAL LOW (ref 22–32)
Calcium: 8.8 mg/dL — ABNORMAL LOW (ref 8.9–10.3)
Chloride: 113 mmol/L — ABNORMAL HIGH (ref 98–111)
Creatinine, Ser: 1.04 mg/dL (ref 0.61–1.24)
GFR calc Af Amer: >60 mL/min (ref >60)
GFR calc non Af Amer: >60 mL/min (ref >60)
Glucose, Bld: 146 mg/dL — ABNORMAL HIGH (ref 70–99)
Potassium: 3.8 mmol/L (ref 3.5–5.1)
Sodium: 145 mmol/L (ref 135–145)
Total Bilirubin: 0.5 mg/dL (ref 0.3–1.2)
Total Protein: 6.6 g/dL (ref 6.5–8.1)

## 2019-06-17 LAB — SURGICAL PCR SCREEN
MRSA, PCR: NEGATIVE
Staphylococcus aureus: NEGATIVE

## 2019-06-17 LAB — ABO/RH: ABO/RH(D): A POS

## 2019-06-17 LAB — SARS CORONAVIRUS 2 BY RT PCR (HOSPITAL ORDER, PERFORMED IN ~~LOC~~ HOSPITAL LAB): SARS Coronavirus 2: NEGATIVE

## 2019-06-17 LAB — PROTIME-INR
INR: 0.9 (ref 0.8–1.2)
Prothrombin Time: 12.2 seconds (ref 11.4–15.2)

## 2019-06-17 LAB — APTT: aPTT: 25 seconds (ref 24–36)

## 2019-06-17 MED ORDER — TRAZODONE HCL 50 MG PO TABS
25.0000 mg | ORAL_TABLET | Freq: Every evening | ORAL | Status: DC | PRN
  Administered 2019-06-17: 22:00:00 25 mg via ORAL
  Filled 2019-06-17: qty 1

## 2019-06-17 MED ORDER — PROPRANOLOL HCL 20 MG PO TABS
10.0000 mg | ORAL_TABLET | Freq: Two times a day (BID) | ORAL | Status: DC | PRN
  Filled 2019-06-17: qty 1

## 2019-06-17 MED ORDER — VALBENAZINE TOSYLATE 80 MG PO CAPS: 80.0000 mg | ORAL_CAPSULE | Freq: Every day | ORAL | Status: DC

## 2019-06-17 MED ORDER — VITAMIN D 25 MCG (1000 UNIT) PO TABS
1000.0000 [IU] | ORAL_TABLET | Freq: Every day | ORAL | Status: DC
  Administered 2019-06-19 – 2019-06-21 (×3): 1000 [IU] via ORAL
  Filled 2019-06-17 (×3): qty 1

## 2019-06-17 MED ORDER — MORPHINE SULFATE (PF) 4 MG/ML IV SOLN
4.0000 mg | Freq: Once | INTRAVENOUS | Status: AC
  Administered 2019-06-17: 4 mg via INTRAVENOUS

## 2019-06-17 MED ORDER — MORPHINE SULFATE (PF) 2 MG/ML IV SOLN
0.5000 mg | INTRAVENOUS | Status: DC | PRN
  Administered 2019-06-17: 0.5 mg via INTRAVENOUS
  Filled 2019-06-17: qty 1

## 2019-06-17 MED ORDER — HYDROCODONE-ACETAMINOPHEN 5-325 MG PO TABS
1.0000 | ORAL_TABLET | Freq: Four times a day (QID) | ORAL | Status: DC | PRN
  Administered 2019-06-17: 1 via ORAL
  Administered 2019-06-21: 2 via ORAL
  Filled 2019-06-17: qty 1
  Filled 2019-06-17: qty 2

## 2019-06-17 MED ORDER — MORPHINE SULFATE (PF) 4 MG/ML IV SOLN
INTRAVENOUS | Status: AC
  Filled 2019-06-17: qty 1

## 2019-06-17 MED ORDER — VITAMIN B-1 100 MG PO TABS
100.0000 mg | ORAL_TABLET | Freq: Every day | ORAL | Status: DC
  Administered 2019-06-19 – 2019-06-21 (×3): 100 mg via ORAL
  Filled 2019-06-17 (×3): qty 1

## 2019-06-17 MED ORDER — MUPIROCIN 2 % EX OINT
1.0000 "application " | TOPICAL_OINTMENT | Freq: Two times a day (BID) | CUTANEOUS | Status: DC
  Filled 2019-06-17: qty 22

## 2019-06-17 MED ORDER — QUETIAPINE FUMARATE 25 MG PO TABS
12.5000 mg | ORAL_TABLET | Freq: Every day | ORAL | Status: DC | PRN
  Administered 2019-06-17: 22:00:00 12.5 mg via ORAL
  Filled 2019-06-17: qty 1

## 2019-06-17 MED ORDER — CEFAZOLIN SODIUM-DEXTROSE 2-4 GM/100ML-% IV SOLN
2.0000 g | INTRAVENOUS | Status: AC
  Administered 2019-06-18: 2 g via INTRAVENOUS
  Filled 2019-06-17: qty 100

## 2019-06-17 MED ORDER — SODIUM CHLORIDE 0.9 % IV SOLN
INTRAVENOUS | Status: DC
  Administered 2019-06-17 – 2019-06-19 (×3): via INTRAVENOUS

## 2019-06-17 MED ORDER — LEVOTHYROXINE SODIUM 25 MCG PO TABS
25.0000 ug | ORAL_TABLET | Freq: Every day | ORAL | Status: DC
  Administered 2019-06-19 – 2019-06-21 (×3): 25 ug via ORAL
  Filled 2019-06-17 (×3): qty 1

## 2019-06-17 MED ORDER — TRANEXAMIC ACID-NACL 1000-0.7 MG/100ML-% IV SOLN
1000.0000 mg | INTRAVENOUS | Status: AC
  Administered 2019-06-18: 1000 mg via INTRAVENOUS
  Filled 2019-06-17: qty 100

## 2019-06-17 MED ORDER — ONDANSETRON HCL 4 MG/2ML IJ SOLN
INTRAMUSCULAR | Status: AC
  Filled 2019-06-17: qty 2

## 2019-06-17 MED ORDER — LORATADINE 10 MG PO TABS
10.0000 mg | ORAL_TABLET | Freq: Every day | ORAL | Status: DC
  Administered 2019-06-19 – 2019-06-21 (×3): 10 mg via ORAL
  Filled 2019-06-17 (×3): qty 1

## 2019-06-17 MED ORDER — ALLOPURINOL 300 MG PO TABS
300.0000 mg | ORAL_TABLET | Freq: Every day | ORAL | Status: DC
  Administered 2019-06-19 – 2019-06-21 (×3): 300 mg via ORAL
  Filled 2019-06-17 (×3): qty 1

## 2019-06-17 MED ORDER — ONDANSETRON HCL 4 MG/2ML IJ SOLN
4.0000 mg | Freq: Once | INTRAMUSCULAR | Status: AC
  Administered 2019-06-17: 4 mg via INTRAVENOUS

## 2019-06-17 MED ORDER — CITALOPRAM HYDROBROMIDE 20 MG PO TABS
10.0000 mg | ORAL_TABLET | Freq: Every day | ORAL | Status: DC
  Administered 2019-06-19 – 2019-06-21 (×3): 10 mg via ORAL
  Filled 2019-06-17 (×3): qty 1

## 2019-06-17 NOTE — ED Notes (Addendum)
ED TO INPATIENT HANDOFF REPORT  ED Nurse Name and Phone #: Shanda Bumpsjessica [phone]  S Name/Age/Gender Andres [name] 53 y.o. male Room/Bed: ED25A/ED25A  Code Status   Code Status: Full Code  Home/SNF/Other Home Patient oriented to: self, place, time and situation Is this baseline? Yes   Triage Complete: Triage complete  Chief Complaint Fall-EMS  Triage Note Pt comes from home via EMS after falling while walking to the mailbox Friday. Pt has been sitting in a chair since the fall. Pt's father says his feet are typically put out to the side but right hip is shortened and rotated. Pt typically has some mental disability per father but is AOx4 with EMS.    Allergies Allergies  Allergen Reactions  . Prednisone Other (See Comments)    Did not like the way it made him feel   . Lamotrigine Rash    Level of Care/Admitting Diagnosis ED Disposition    ED Disposition Condition Comment   Admit  Hospital Area: [facility] [[id number]]  Level of Care: Med-Surg [16]  Covid Evaluation: Person Under Investigation (PUI)  Isolation Risk Level: Low Risk/Droplet (Less than 4L [place] supplementation)  Diagnosis: Closed comminuted fracture of right hip [facility](HCC) [[id number]]) [[id number]]  Admitting Physician: Cristie HemUMA, ELIZABETH ACHIENG [[id number]]  Attending Physician: Alford HighlandWIETING, RICHARD [phone][[id number]]  Estimated length of stay: past midnight tomorrow  Certification:: I certify this patient will need inpatient services for at least 2 midnights  PT Class (Do Not Modify): Inpatient [101]  PT Acc Code (Do Not Modify): Private [1]       B Medical/Surgery History Past Medical History:  Diagnosis Date  . Allergy    Past Surgical History:  Procedure Laterality Date  . ANKLE SURGERY    . COLON SURGERY    . HERNIA REPAIR    . TONSILLECTOMY       A IV Location/Drains/Wounds Patient Lines/Drains/Airways Status   Active Line/Drains/Airways    Name:   Placement date:   Placement time:   Site:   Days:   Peripheral  IV 06/17/19 Right Arm   06/17/19    1353    Arm   less than 1   Peripheral IV 06/17/19 Left Arm   06/17/19    1759    Arm   less than 1          Intake/Output Last 24 hours No intake or output data in the 24 hours ending 06/17/19 1759  Labs/Imaging Results for orders placed or performed during the hospital encounter of 06/17/19 (from the past 48 hour(s))  CBC with Differential     Status: Abnormal   Collection Time: 06/17/19  1:38 PM  Result Value Ref Range   WBC 7.4 4.0 - 10.5 K/uL   RBC 4.55 4.22 - 5.81 MIL/uL   Hemoglobin 14.0 13.0 - 17.0 g/dL   HCT 47.841.7 29.539.0 - 62.152.0 %   MCV 91.6 80.0 - 100.0 fL   MCH 30.8 26.0 - 34.0 pg   MCHC 33.6 30.0 - 36.0 g/dL   RDW 30.813.6 65.711.5 - 84.615.5 %   Platelets 103 (L) 150 - 400 K/uL    Comment: Immature Platelet Fraction may be clinically indicated, consider ordering this additional test [id number]    nRBC 0.0 0.0 - 0.2 %   Neutrophils Relative % 74 %   Neutro Abs 5.4 1.7 - 7.7 K/uL   Lymphocytes Relative 12 %   Lymphs Abs 0.9 0.7 - 4.0 K/uL   Monocytes Relative 14 %   Monocytes Absolute  1.0 0.1 - 1.0 K/uL   Eosinophils Relative 0 %   Eosinophils Absolute 0.0 0.0 - 0.5 K/uL   Basophils Relative 0 %   Basophils Absolute 0.0 0.0 - 0.1 K/uL   Immature Granulocytes 0 %   Abs Immature Granulocytes 0.03 0.00 - 0.07 K/uL    Comment: Performed at [facility], [address] [place], [place] [address]  Comprehensive metabolic panel     Status: Abnormal   Collection Time: 06/17/19  1:38 PM  Result Value Ref Range   Sodium 145 135 - 145 mmol/L   Potassium 3.8 3.5 - 5.1 mmol/L   Chloride 113 (H) 98 - 111 mmol/L   CO2 20 (L) 22 - 32 mmol/L   Glucose, Bld 146 (H) 70 - 99 mg/dL   BUN 21 (H) 6 - 20 mg/dL   Creatinine, Ser 1.04 0.61 - 1.24 mg/dL   Calcium 8.8 (L) 8.9 - 10.3 mg/dL   Total Protein 6.6 6.5 - 8.1 g/dL   Albumin 3.8 3.5 - 5.0 g/dL   AST 31 15 - 41 U/L   ALT 27 0 - 44 U/L   Alkaline Phosphatase 56 38 - 126 U/L   Total  Bilirubin 0.5 0.3 - 1.2 mg/dL   GFR calc non Af Amer >60 >60 mL/min   GFR calc Af Amer >60 >60 mL/min   Anion gap 12 5 - 15    Comment: Performed at [facility], [address] [place], [place] [address]  Protime-INR     Status: None   Collection Time: 06/17/19  1:38 PM  Result Value Ref Range   Prothrombin Time 12.2 11.4 - 15.2 seconds   INR 0.9 0.8 - 1.2    Comment: (NOTE) INR goal varies based on device and disease states. Performed at [facility], [address] [place], [place] [address]   APTT     Status: None   Collection Time: 06/17/19  1:38 PM  Result Value Ref Range   aPTT 25 24 - 36 seconds    Comment: Performed at [facility], [address] [place], [place] [address]  SARS Coronavirus 2 [facility] order, Performed in [place] hospital lab)     Status: None   Collection Time: 06/17/19  4:42 PM   Specimen: Nasopharyngeal Swab  Result Value Ref Range   SARS Coronavirus 2 NEGATIVE NEGATIVE    Comment: (NOTE) If result is NEGATIVE SARS-CoV-2 target nucleic acids are NOT DETECTED. The SARS-CoV-2 RNA is generally detectable in upper and lower  respiratory specimens during the acute phase of infection. The lowest  concentration of SARS-CoV-2 viral copies this assay can detect is 250  copies / mL. A negative result does not preclude SARS-CoV-2 infection  and should not be used as the sole basis for treatment or other  patient management decisions.  A negative result may occur with  improper specimen collection / handling, submission of specimen other  than nasopharyngeal swab, presence of viral mutation(s) within the  areas targeted by this assay, and inadequate number of viral copies  (<250 copies / mL). A negative result must be combined with clinical  observations, patient history, and epidemiological information. If result is POSITIVE SARS-CoV-2 target nucleic acids are DETECTED. The SARS-CoV-2 RNA is generally detectable in upper  and lower  respiratory specimens dur ing the acute phase of infection.  Positive  results are indicative of active infection with SARS-CoV-2.  Clinical  correlation with patient history and other diagnostic information is  necessary to determine patient infection status.  Positive results do  not rule out bacterial infection or co-infection with other viruses. If result is PRESUMPTIVE POSTIVE SARS-CoV-2 nucleic acids MAY BE PRESENT.   A presumptive positive result was obtained on the submitted specimen  and confirmed on repeat testing.  While 2019 novel coronavirus  (SARS-CoV-2) nucleic acids may be present in the submitted sample  additional confirmatory testing may be necessary for epidemiological  and / or clinical management purposes  to differentiate between  SARS-CoV-2 and other Sarbecovirus currently known to infect humans.  If clinically indicated additional testing with an alternate test  methodology [phone]([id number]) is advised. The SARS-CoV-2 RNA is generally  detectable in upper and lower respiratory sp ecimens during the acute  phase of infection. The expected result is Negative. Fact Sheet for Patients:  BoilerBrush.com.cyhttps://www.fda.gov/media/136312/download Fact Sheet for Healthcare Providers: https://pope.com/https://www.fda.gov/media/136313/download This test is not yet approved or cleared by the [place] FDA and has been authorized for detection and/or diagnosis of SARS-CoV-2 by FDA under an Emergency Use Authorization (EUA).  This EUA will remain in effect (meaning this test can be used) for the duration of the COVID-19 declaration under Section 564(b)(1) of the Act, 21 U.S.C. section 360bbb-3(b)(1), unless the authorization is terminated or revoked sooner. Performed at [facility], [address] [place], [place] [address]   ABO/Rh     Status: None   Collection Time: 06/17/19  4:42 PM  Result Value Ref Range   ABO/RH(D)      A POS Performed at [facility], [address] [place], [place] [address]    Dg Chest 1 View  Result Date: 06/17/2019 CLINICAL DATA:  Preop exam. EXAM: CHEST  1 VIEW COMPARISON:  None. FINDINGS: Lungs are adequately inflated and otherwise clear. Cardiomediastinal silhouette is normal. Bones and soft tissues are within normal. IMPRESSION: No active disease. Electronically Signed   By: Elberta Fortisaniel  Boyle M.D.   On: 06/17/2019 14:34   Dg Hip Unilat  With Pelvis 2-3 Views Right  Result Date: 06/17/2019 CLINICAL DATA:  Fall 2 days ago.  Unable to bear weight. EXAM: DG HIP (WITH OR WITHOUT PELVIS) 2-3V RIGHT COMPARISON:  None. FINDINGS: Mild diffuse decreased bone mineralization. Mild symmetric degenerative change of the hips. There is a displaced right femoral intertrochanteric fracture with possible minimal comminution. Superolateral displacement of the distal fragment. Remainder of the exam is unremarkable. IMPRESSION: Displaced right femoral intertrochanteric fracture with possible mild comminution. Electronically Signed   By: Elberta Fortisaniel  Boyle M.D.   On: 06/17/2019 14:33   Dg Femur Min 2 Views Right  Result Date: 06/17/2019 CLINICAL DATA:  Larey SeatFell 2 days ago.  Right hip pain. EXAM: RIGHT FEMUR 2 VIEWS COMPARISON:  None. FINDINGS: There is a displaced comminuted intertrochanteric fracture of the right hip with a significant varus deformity and shortening. The visualized right hemipelvis is intact. No femoral shaft fracture. The knee joint is maintained. IMPRESSION: Displaced comminuted intertrochanteric fracture of the right hip. No femoral shaft fracture. Electronically Signed   By: Rudie MeyerP.  Gallerani M.D.   On: 06/17/2019 14:46    Pending Labs Unresulted Labs (From admission, onward)    Start     Ordered   06/17/19 1522  HIV antibody (Routine Testing)  Once,   STAT     06/17/19 1526   06/17/19 1420  Urinalysis, Complete w Microscopic  ONCE - STAT,   STAT     06/17/19 1419          Vitals/Pain Today's  Vitals   06/17/19 1645 06/17/19 1700 06/17/19  1715 06/17/19 1730  BP:  (!) 153/90  (!) 154/82  Pulse: 60 61 (!) 58 (!) 58  Resp: 19 15 15 12   Temp:      TempSrc:      SpO2: 98% 98% 98% 98%  Weight:      Height:      PainSc:        Isolation Precautions Droplet and Contact precautions  Medications Medications  allopurinol (ZYLOPRIM) tablet 300 mg (has no administration in time range)  propranolol (INDERAL) tablet 10 mg (has no administration in time range)  citalopram (CELEXA) tablet 10 mg (has no administration in time range)  QUEtiapine (SEROQUEL) tablet 12.5 mg (has no administration in time range)  traZODone (DESYREL) tablet 25 mg (has no administration in time range)  Valbenazine Tosylate CAPS 80 mg (has no administration in time range)  levothyroxine (SYNTHROID) tablet 25 mcg (has no administration in time range)  cholecalciferol (VITAMIN D3) tablet 1,000 Units (has no administration in time range)  thiamine (VITAMIN B-1) tablet 100 mg (has no administration in time range)  loratadine (CLARITIN) tablet 10 mg (has no administration in time range)  HYDROcodone-acetaminophen (NORCO/VICODIN) 5-325 MG per tablet 1-2 tablet (has no administration in time range)  morphine 2 MG/ML injection 0.5 mg (has no administration in time range)  0.9 %  sodium chloride infusion (has no administration in time range)  morphine 4 MG/ML injection 4 mg (4 mg Intravenous Given 06/17/19 1420)  ondansetron (ZOFRAN) injection 4 mg (4 mg Intravenous Given 06/17/19 1420)    Mobility non-ambulatory High fall risk   Focused Assessments Right hip fracture  R Recommendations: See Admitting Provider Note  Report given to:   Additional Notes:  Pt has some mental disability but is generally able and AOx4.

## 2019-06-17 NOTE — ED Provider Notes (Signed)
[facility] Emergency Department Provider Note   ____________________________________________   First MD Initiated Contact with Patient 06/17/19 1417     (approximate)  I have reviewed the triage vital signs and the nursing notes.   HISTORY  Chief Complaint Fall    HPI Andres [name] is a 53 y.o. male who fell on Friday going to the mailbox.  He is been complaining of pain in the mid femur and it hurts there to palpate.  X-ray shows he does have a fracture intertrochanteric of the femoral neck.  He did not injure anything else he says he has been doing well he ate lunch today at 1130.  Denies any headache hitting his head neck pain chest pain belly pain or other injuries.         Past Medical History:  Diagnosis Date  . Allergy     Patient Active Problem List   Diagnosis Date Noted  . Closed comminuted fracture of right hip (HCC) 06/17/2019  . Loose stools 07/07/2018  . Weight loss 06/09/2018  . Hypothyroidism 02/28/2018  . Vitamin D deficiency, unspecified 02/28/2018  . 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 Development delay 01/18/2018  . 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 Localized edema 09/26/2015  . Lack of expected normal physiological development 05/09/2015  . FHx: colon cancer 05/09/2015  . Essential hypertension 05/09/2015  . 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 Tibia/fibula fracture 08/21/2013  . Seasonal allergies 08/21/2013  . Allergic rhinitis 08/21/2013  . Schizoaffective disorder (HCC) 05/05/2004    Past Surgical History:  Procedure Laterality Date  . ANKLE SURGERY    . COLON SURGERY    . HERNIA REPAIR    . TONSILLECTOMY      Prior to Admission medications   Medication Sig Start Date End Date Taking? Authorizing Provider  allopurinol (ZYLOPRIM) 300 MG tablet Take 1 tablet (300 mg total) by mouth daily. 07/07/18   Karamalegos, Netta NeatAlexander J, DO  cholecalciferol (VITAMIN D) 1000 units tablet Take 1,000 Units  by mouth daily.    [provider]  citalopram (CELEXA) 10 MG tablet Take 1 tablet (10 mg total) by mouth daily. 06/13/19   Jomarie LongsEappen, Saramma, MD  fexofenadine (ALLEGRA) 180 MG tablet Take 180 mg by mouth.    [provider]  levothyroxine (SYNTHROID) 25 MCG tablet TAKE 1 TABLET BY MOUTH DAILY BEFORE BREAKFAST. ON EMPTY STOMACH, NOT TO TAKE WITH OTHER MEDICINES 05/04/19   Smitty CordsKaramalegos, Alexander J, DO  propranolol (INDERAL) 10 MG tablet Take 1 tablet (10 mg total) by mouth 2 (two) times daily as needed. For severe anxiety/agitation 06/13/19   Jomarie LongsEappen, Saramma, MD  QUEtiapine (SEROQUEL) 25 MG tablet Take 0.5 tablets (12.5 mg total) by mouth daily as needed. For severe anxiety/agitation Patient taking differently: Take 12.5 mg by mouth daily as needed. For severe anxiety/agitationDR EAPEN 05/10/18   Jomarie LongsEappen, Saramma, MD  QUEtiapine (SEROQUEL) 50 MG tablet Take 1 tablet (50 mg total) by mouth at bedtime. 06/13/19   Jomarie LongsEappen, Saramma, MD  thiamine (VITAMIN B-1) 100 MG tablet Take 100 mg by mouth daily.    [provider]  traZODone (DESYREL) 50 MG tablet Take 0.5 tablets (25 mg total) by mouth at bedtime as needed for sleep. 06/13/19   Jomarie LongsEappen, Saramma, MD  Valbenazine Tosylate (INGREZZA) 80 MG CAPS Take 80 mg by mouth at bedtime. 11/07/18   Jomarie LongsEappen, Saramma, MD    Allergies Prednisone and Lamotrigine  Family History  Problem Relation Age of Onset  . Diabetes Mother   .  Stroke Mother   . Cancer Father        colon  . Colon cancer Father   . Colon cancer Other   . Mental illness Neg Hx     Social History Social History   Tobacco Use  . Smoking status: Never Smoker  . Smokeless tobacco: Never Used  Substance Use Topics  . Alcohol use: No    Frequency: Never  . Drug use: No    Review of systems Constitutional: No fever/chills Eyes: No visual changes. ENT: No sore throat. Cardiovascular: Denies chest pain. Respiratory: Denies shortness of breath. Gastrointestinal: No  abdominal pain.  No nausea, no vomiting.  No diarrhea.  No constipation. Genitourinary: Negative for dysuria. Musculoskeletal: Negative for back pain. Skin: Negative for rash. Neurological: Negative for headaches, focal weakness   ____________________________________________   PHYSICAL EXAM:  VITAL SIGNS: ED Triage Vitals  Enc Vitals Group     BP 06/17/19 1336 (!) 162/84     Pulse Rate 06/17/19 1336 66     Resp 06/17/19 1336 (!) 22     Temp 06/17/19 1336 98.6 F (37 C)     Temp Source 06/17/19 1336 Oral     SpO2 06/17/19 1336 100 %     Weight 06/17/19 1331 160 lb (72.6 kg)     Height 06/17/19 1331 5\' 6"  (1.676 m)     Head Circumference --      Peak Flow --      Pain Score --      Pain Loc --      Pain Edu? --      Excl. in [place]? --     Constitutional: Alert and oriented. Well appearing and in no acute distress. Eyes: Conjunctivae are normal.  Head: Atraumatic. Nose: No congestion/rhinnorhea. Mouth/Throat: Mucous membranes are moist.  Oropharynx non-erythematous. Neck: No stridor.   Cardiovascular: Normal rate, regular rhythm. Grossly normal heart sounds.  Good peripheral circulation. Respiratory: Normal respiratory effort.  No retractions. Lungs CTAB. Gastrointestinal: Soft and nontender. No distention. No abdominal bruits. No CVA tenderness. Musculoskeletal: No lower extremity tenderness nor edema.   Neurologic:  Normal speech and language. No gross focal neurologic deficits are appreciated. Skin:  Skin is warm, dry and intact. No rash noted.   ____________________________________________   LABS (all labs ordered are listed, but only abnormal results are displayed)  Labs Reviewed  CBC WITH DIFFERENTIAL/PLATELET - Abnormal; Notable for the following components:      Result Value   Platelets 103 (*)    All other components within normal limits  COMPREHENSIVE METABOLIC PANEL - Abnormal; Notable for the following components:   Chloride 113 (*)    CO2 20 (*)     Glucose, Bld 146 (*)    BUN 21 (*)    Calcium 8.8 (*)    All other components within normal limits  SARS CORONAVIRUS 2 (HOSPITAL ORDER, [place] [facility])  PROTIME-INR  APTT  URINALYSIS, COMPLETE (UACMP) WITH MICROSCOPIC  HIV ANTIBODY (ROUTINE TESTING W REFLEX)  ABO/RH   ____________________________________________  EKG  EKG read interpreted by me shows what appears to be artifact with normal sinus rhythm although it could be a flutter at a rate of 68 left axis no acute changes nonspecific ST-T changes ____________________________________________  RADIOLOGY  ED MD interpretatio x-rays read by radiology reviewed by me show a intertrochanteric hip fracture chest x-ray does not show any acute disease  Official radiology report(s): Dg Chest 1 View  Result Date: 06/17/2019 CLINICAL DATA:  Preop exam. EXAM: CHEST  1 VIEW COMPARISON:  None. FINDINGS: Lungs are adequately inflated and otherwise clear. Cardiomediastinal silhouette is normal. Bones and soft tissues are within normal. IMPRESSION: No active disease. Electronically Signed   By: Elberta Fortisaniel  Boyle M.D.   On: 06/17/2019 14:34   Dg Hip Unilat  With Pelvis 2-3 Views Right  Result Date: 06/17/2019 CLINICAL DATA:  Fall 2 days ago.  Unable to bear weight. EXAM: DG HIP (WITH OR WITHOUT PELVIS) 2-3V RIGHT COMPARISON:  None. FINDINGS: Mild diffuse decreased bone mineralization. Mild symmetric degenerative change of the hips. There is a displaced right femoral intertrochanteric fracture with possible minimal comminution. Superolateral displacement of the distal fragment. Remainder of the exam is unremarkable. IMPRESSION: Displaced right femoral intertrochanteric fracture with possible mild comminution. Electronically Signed   By: Elberta Fortisaniel  Boyle M.D.   On: 06/17/2019 14:33   Dg Femur Min 2 Views Right  Result Date: 06/17/2019 CLINICAL DATA:  Larey SeatFell 2 days ago.  Right hip pain. EXAM: RIGHT FEMUR 2 VIEWS COMPARISON:  None.  FINDINGS: There is a displaced comminuted intertrochanteric fracture of the right hip with a significant varus deformity and shortening. The visualized right hemipelvis is intact. No femoral shaft fracture. The knee joint is maintained. IMPRESSION: Displaced comminuted intertrochanteric fracture of the right hip. No femoral shaft fracture. Electronically Signed   By: Rudie MeyerP.  Gallerani M.D.   On: 06/17/2019 14:46    ____________________________________________   PROCEDURES  Procedure(s) performed (including Critical Care):  Procedures   ____________________________________________   INITIAL IMPRESSION / ASSESSMENT AND PLAN / ED COURSE   Andres [name] was evaluated in Emergency Department on 06/17/2019 for the symptoms described in the history of present illness. He was evaluated in the context of the global COVID-19 pandemic, which necessitated consideration that the patient might be at risk for infection with the SARS-CoV-2 virus that causes COVID-19. Institutional protocols and algorithms that pertain to the evaluation of patients at risk for COVID-19 are in a state of rapid change based on information released by regulatory bodies including the CDC and federal and state organizations. These policies and algorithms were followed during the patient's care in the ED.             ____________________________________________   FINAL CLINICAL IMPRESSION(S) / ED DIAGNOSES  Final diagnoses:  Closed fracture of right hip, initial encounter [facility](HCC)     ED Discharge Orders    None       Note:  This document was prepared using Dragon voice recognition software and may include unintentional dictation errors.    Arnaldo NatalMalinda, Paul F, MD 06/17/19 [phone]

## 2019-06-17 NOTE — Consult Note (Signed)
Patient notes, x-rays and labs reviewed. Plan operative repair on 06/18/2019 when OR time is available. NPO after midnight.

## 2019-06-17 NOTE — ED Triage Notes (Signed)
Pt comes from home via EMS after falling while walking to the mailbox Friday. Pt has been sitting in a chair since the fall. Pt's father says his feet are typically put out to the side but right hip is shortened and rotated. Pt typically has some mental disability per father but is AOx4 with EMS.

## 2019-06-17 NOTE — ED Notes (Signed)
Father at bedside.

## 2019-06-17 NOTE — H&P (Signed)
[facility] [place] [facility]   PATIENT NAME: Andres [name]    MR#:  [id number]  DATE OF BIRTH:  08/23/1966  DATE OF ADMISSION:  06/17/2019  PRIMARY CARE PHYSICIAN: Smitty CordsKaramalegos, Alexander J, DO   REQUESTING/REFERRING PHYSICIAN: Devoria GlassingMalinda K. Renae FicklePaul, MD  CHIEF COMPLAINT:   Chief Complaint  Patient presents with  . Fall    HISTORY OF PRESENT ILLNESS:  53 y.o. male with pertinent past medical history of mental retardation, thrombocytopenia, allergic state, depression, chronic bilateral lower extremity edema, hypothyroidism, history of fracture and plate placement in left leg, drug induced tremors presenting to the ED with right hip pain.  Patient report that he was walking tot he mailbox on Friday and suddenly his felt somewhat dizzy and his legs gave way. He usually uses a walker but was not using a walker that day. He denies other associated symptoms of headache, n/v, syncope, LOC, alteration of awareness, speech abnormality, diplopia or paraesthesia. Patient;s father state his pain got worse so he decided to come to the ED today for evaluation.  On arrival to the ED, he was afebrile with blood pressure 162/82 mm Hg and pulse rate 66 beats/min. There were no focal neurological deficits; he was alert and oriented x4.  He had noticeable right hip rotation. ECG showed sinus rhythm of 94 beats per minute and the chest X-ray showed no active cardiopulmonary process.  X-ray of the right hip showed a displaced comminuted intertrochanteric fracture of the right hip.   Initial labs reveal low platelet counts of 103 otherwise unremarkable, blood glucose 146.  X-rays result was discussed with consulting orthopedic by ED physician who will see patient for possible surgery.  Patient will be admitted to hospitalist service for further monitoring and management.  PAST MEDICAL HISTORY:   Past Medical History:  Diagnosis Date  . Allergy     PAST SURGICAL HISTORY:   Past Surgical  History:  Procedure Laterality Date  . ANKLE SURGERY    . COLON SURGERY    . HERNIA REPAIR    . TONSILLECTOMY      SOCIAL HISTORY:   Social History   Tobacco Use  . Smoking status: Never Smoker  . Smokeless tobacco: Never Used  Substance Use Topics  . Alcohol use: No    Frequency: Never    FAMILY HISTORY:   Family History  Problem Relation Age of Onset  . Diabetes Mother   . Stroke Mother   . Cancer Father        colon  . Colon cancer Father   . Colon cancer Other   . Mental illness Neg Hx     DRUG ALLERGIES:   Allergies  Allergen Reactions  . Prednisone Other (See Comments)    Did not like the way it made him feel   . Lamotrigine Rash    REVIEW OF SYSTEMS:   Review of Systems  Constitutional: Negative for chills, fever, malaise/fatigue and weight loss.  HENT: Positive for hearing loss. Negative for congestion and sore throat.   Eyes: Negative for blurred vision and double vision.  Respiratory: Negative for cough, shortness of breath and wheezing.   Cardiovascular: Positive for leg swelling. Negative for chest pain, palpitations and orthopnea.  Gastrointestinal: Negative for abdominal pain, diarrhea, nausea and vomiting.  Genitourinary: Negative for dysuria and urgency.  Musculoskeletal: Positive for falls and joint pain. Negative for myalgias.  Skin: Negative for rash.  Neurological: Positive for tremors and weakness. Negative for dizziness, sensory change, speech  change, focal weakness and headaches.  Psychiatric/Behavioral: Positive for depression.   MEDICATIONS AT HOME:   Prior to Admission medications   Medication Sig Start Date End Date Taking? Authorizing Provider  allopurinol (ZYLOPRIM) 300 MG tablet Take 1 tablet (300 mg total) by mouth daily. 07/07/18   Karamalegos, Devonne Doughty, DO  cholecalciferol (VITAMIN D) 1000 units tablet Take 1,000 Units by mouth daily.    [provider]  citalopram (CELEXA) 10 MG tablet Take 1 tablet (10 mg  total) by mouth daily. 06/13/19   Ursula Alert, MD  fexofenadine (ALLEGRA) 180 MG tablet Take 180 mg by mouth.    [provider]  levothyroxine (SYNTHROID) 25 MCG tablet TAKE 1 TABLET BY MOUTH DAILY BEFORE BREAKFAST. ON EMPTY STOMACH, NOT TO TAKE WITH OTHER MEDICINES 05/04/19   Olin Hauser, DO  propranolol (INDERAL) 10 MG tablet Take 1 tablet (10 mg total) by mouth 2 (two) times daily as needed. For severe anxiety/agitation 06/13/19   Ursula Alert, MD  QUEtiapine (SEROQUEL) 25 MG tablet Take 0.5 tablets (12.5 mg total) by mouth daily as needed. For severe anxiety/agitation Patient taking differently: Take 12.5 mg by mouth daily as needed. For severe anxiety/agitationDR EAPEN 05/10/18   Ursula Alert, MD  QUEtiapine (SEROQUEL) 50 MG tablet Take 1 tablet (50 mg total) by mouth at bedtime. 06/13/19   Ursula Alert, MD  thiamine (VITAMIN B-1) 100 MG tablet Take 100 mg by mouth daily.    [provider]  traZODone (DESYREL) 50 MG tablet Take 0.5 tablets (25 mg total) by mouth at bedtime as needed for sleep. 06/13/19   Ursula Alert, MD  Valbenazine Tosylate (INGREZZA) 80 MG CAPS Take 80 mg by mouth at bedtime. 11/07/18   Ursula Alert, MD      VITAL SIGNS:  Blood pressure (!) 162/84, pulse 66, temperature 98.6 F (37 C), temperature source Oral, resp. rate (!) 22, height 5\' 6"  (1.676 m), weight 72.6 kg, SpO2 100 %.  PHYSICAL EXAMINATION:   Physical Exam  GENERAL:  53 y.o.-year-old patient lying in the bed with no acute distress.  EYES: Pupils equal, round, reactive to light and accommodation. No scleral icterus. Extraocular muscles intact.  HEENT: Head atraumatic, normocephalic. Oropharynx and nasopharynx clear.  NECK:  Supple, no jugular venous distention. No thyroid enlargement, no tenderness.  LUNGS: Normal breath sounds bilaterally, no wheezing, rales,rhonchi or crepitation. No use of accessory muscles of respiration.  CARDIOVASCULAR: S1, S2 normal. No  murmurs, rubs, or gallops.  ABDOMEN: Soft, nontender, nondistended. Bowel sounds present. No organomegaly or mass.  EXTREMITIES: No pedal edema, cyanosis, or clubbing. No rash or lesions. + pedal pulses MUSCULOSKELETAL: Normal bulk, and power was 5+ grip and elbow. Knee, and ankle flexion and extension not tested. NEUROLOGIC:Alert and oriented x 3. CN 2-12 intact. Sensation to light touch and cold stimuli intact bilaterally. Finger to nose nl. Babinski is downgoing. DTR's (biceps, patellar, and achilles) 2+ and symmetric throughout. Gait not tested due to safety concern. PSYCHIATRIC: The patient is alert and oriented x 3.  SKIN: as below      DATA REVIEWED:  LABORATORY PANEL:   CBC Recent Labs  Lab 06/17/19 1338  WBC 7.4  HGB 14.0  HCT 41.7  PLT 103*   ------------------------------------------------------------------------------------------------------------------  Chemistries  Recent Labs  Lab 06/17/19 1338  NA 145  K 3.8  CL 113*  CO2 20*  GLUCOSE 146*  BUN 21*  CREATININE 1.04  CALCIUM 8.8*  AST 31  ALT 27  ALKPHOS 56  BILITOT  0.5   ------------------------------------------------------------------------------------------------------------------  Cardiac Enzymes No results for input(s): TROPONINI in the last 168 hours. ------------------------------------------------------------------------------------------------------------------  RADIOLOGY:  Dg Chest 1 View  Result Date: 06/17/2019 CLINICAL DATA:  Preop exam. EXAM: CHEST  1 VIEW COMPARISON:  None. FINDINGS: Lungs are adequately inflated and otherwise clear. Cardiomediastinal silhouette is normal. Bones and soft tissues are within normal. IMPRESSION: No active disease. Electronically Signed   By: Elberta Fortisaniel  Boyle M.D.   On: 06/17/2019 14:34   Dg Hip Unilat  With Pelvis 2-3 Views Right  Result Date: 06/17/2019 CLINICAL DATA:  Fall 2 days ago.  Unable to bear weight. EXAM: DG HIP (WITH OR WITHOUT PELVIS)  2-3V RIGHT COMPARISON:  None. FINDINGS: Mild diffuse decreased bone mineralization. Mild symmetric degenerative change of the hips. There is a displaced right femoral intertrochanteric fracture with possible minimal comminution. Superolateral displacement of the distal fragment. Remainder of the exam is unremarkable. IMPRESSION: Displaced right femoral intertrochanteric fracture with possible mild comminution. Electronically Signed   By: Elberta Fortisaniel  Boyle M.D.   On: 06/17/2019 14:33   Dg Femur Min 2 Views Right  Result Date: 06/17/2019 CLINICAL DATA:  Larey SeatFell 2 days ago.  Right hip pain. EXAM: RIGHT FEMUR 2 VIEWS COMPARISON:  None. FINDINGS: There is a displaced comminuted intertrochanteric fracture of the right hip with a significant varus deformity and shortening. The visualized right hemipelvis is intact. No femoral shaft fracture. The knee joint is maintained. IMPRESSION: Displaced comminuted intertrochanteric fracture of the right hip. No femoral shaft fracture. Electronically Signed   By: Rudie MeyerP.  Gallerani M.D.   On: 06/17/2019 14:46    EKG:  EKG: normal EKG, normal sinus rhythm, unchanged from previous tracings.  IMPRESSION AND PLAN:   53 y.o. male with pertinent past medical history of mental retardation, thrombocytopenia, allergic state, depression, chronic bilateral lower extremity edema, hypothyroidism, history of fracture and plate placement in left leg, drug induced tremors presenting to the ED with right hip pain.  1. Pre-op consultation for Acute right hip fracture- secondary to unwitnessed fall - No contraindication to surgery at this time and no further testing needed. - X-ray of the right hip reviewed shows displaced comminuted intertrochanteric fracture of the right hip - Admit to orthopedic unit - Orthopedic consult - Hold all anticoagulation and antiplatelet pending surgical intervention - Pain management  2. Fall -unclear etiology patient state felt slightly dizzy and legs gave  away but did not hit head - Chest x-ray reviewed and shows no active cardiopulmonary process - We will check UA - PT/OT consult once approved by orthopedic  3. Chronic thrombocytopenia - Continue to monitor - No signs of active bleeding or infection  4. Mental Retardation with mood disorder - Continue psych meds - Follows with Dr. Elna BreslowEappen  5. DVT prophylaxis - Hold anti-coagulation pending procedure    All the records are reviewed and case discussed with ED provider. Management plans discussed with the patient, family and they are in agreement.  CODE STATUS: FULL  TOTAL TIME TAKING CARE OF THIS PATIENT: 40 minutes.    on 06/17/2019 at 3:26 PM  This patient was staffed with Dr. Renae GlossWieting, Gerlene Burdockichard who personally evaluated patient, reviewed documentation and agreed with assessment and plan of care as above.  Webb SilversmithElizabeth Kimiya Brunelle, DNP, FNP-BC [facility] Nurse Practitioner Between 7am to 6pm - Pager [phone]- [phone]  After 6pm go to [email or web address] - Social research officer, governmentpassword [email or web address]  [facility] [place] [facility]  Office  [phone]  CC: Primary care physician; Smitty CordsKaramalegos, Alexander J, DO

## 2019-06-18 ENCOUNTER — Inpatient Hospital Stay: Payer: Medicare Other

## 2019-06-18 ENCOUNTER — Inpatient Hospital Stay: Payer: Medicare Other | Admitting: Anesthesiology

## 2019-06-18 ENCOUNTER — Encounter
Admission: EM | Disposition: A | Discharge status: Skilled Nursing Facility | Payer: Self-pay | Source: Home / Self Care | Attending: Internal Medicine

## 2019-06-18 HISTORY — PX: INTRAMEDULLARY (IM) NAIL INTERTROCHANTERIC: SHX5875

## 2019-06-18 LAB — CBC
HCT: 38.1 % — ABNORMAL LOW (ref 39.0–52.0)
Hemoglobin: 12.5 g/dL — ABNORMAL LOW (ref 13.0–17.0)
MCH: 30.5 pg (ref 26.0–34.0)
MCHC: 32.8 g/dL (ref 30.0–36.0)
MCV: 92.9 fL (ref 80.0–100.0)
Platelets: 107 10*3/uL — ABNORMAL LOW (ref 150–400)
RBC: 4.1 MIL/uL — ABNORMAL LOW (ref 4.22–5.81)
RDW: 13.5 % (ref 11.5–15.5)
WBC: 6.9 10*3/uL (ref 4.0–10.5)
nRBC: 0 % (ref 0.0–0.2)

## 2019-06-18 LAB — CREATININE, SERUM
Creatinine, Ser: 0.92 mg/dL (ref 0.61–1.24)
GFR calc Af Amer: >60 mL/min (ref >60)
GFR calc non Af Amer: >60 mL/min (ref >60)

## 2019-06-18 LAB — URINALYSIS, COMPLETE (UACMP) WITH MICROSCOPIC
Bilirubin Urine: NEGATIVE
Glucose, UA: NEGATIVE mg/dL
Hgb urine dipstick: NEGATIVE
Ketones, ur: 5 mg/dL — AB
Leukocytes,Ua: NEGATIVE
Nitrite: NEGATIVE
Protein, ur: NEGATIVE mg/dL
Specific Gravity, Urine: 1.018 (ref 1.005–1.030)
pH: 6 (ref 5.0–8.0)

## 2019-06-18 SURGERY — FIXATION, FRACTURE, INTERTROCHANTERIC, WITH INTRAMEDULLARY ROD
Anesthesia: General | Site: Hip | Laterality: Right

## 2019-06-18 MED ORDER — DOCUSATE SODIUM 100 MG PO CAPS
100.0000 mg | ORAL_CAPSULE | Freq: Two times a day (BID) | ORAL | Status: DC
  Administered 2019-06-18 – 2019-06-20 (×5): 100 mg via ORAL
  Filled 2019-06-18 (×5): qty 1

## 2019-06-18 MED ORDER — PROPOFOL 10 MG/ML IV BOLUS
INTRAVENOUS | Status: DC | PRN
  Administered 2019-06-18: 150 mg via INTRAVENOUS

## 2019-06-18 MED ORDER — ROCURONIUM BROMIDE 100 MG/10ML IV SOLN
INTRAVENOUS | Status: DC | PRN
  Administered 2019-06-18: 40 mg via INTRAVENOUS

## 2019-06-18 MED ORDER — MIDAZOLAM HCL 2 MG/2ML IJ SOLN
INTRAMUSCULAR | Status: AC
  Filled 2019-06-18: qty 2

## 2019-06-18 MED ORDER — SUGAMMADEX SODIUM 500 MG/5ML IV SOLN
INTRAVENOUS | Status: DC | PRN
  Administered 2019-06-18: 150 mg via INTRAVENOUS

## 2019-06-18 MED ORDER — METOCLOPRAMIDE HCL 5 MG/ML IJ SOLN: 5.0000 mg | Freq: Three times a day (TID) | INTRAMUSCULAR | Status: DC | PRN

## 2019-06-18 MED ORDER — TRANEXAMIC ACID 1000 MG/10ML IV SOLN
INTRAVENOUS | Status: AC
  Filled 2019-06-18: qty 10

## 2019-06-18 MED ORDER — FENTANYL CITRATE (PF) 100 MCG/2ML IJ SOLN
INTRAMUSCULAR | Status: AC
  Filled 2019-06-18: qty 2

## 2019-06-18 MED ORDER — METOCLOPRAMIDE HCL 10 MG PO TABS: 5.0000 mg | ORAL_TABLET | Freq: Three times a day (TID) | ORAL | Status: DC | PRN

## 2019-06-18 MED ORDER — CEFAZOLIN SODIUM-DEXTROSE 1-4 GM/50ML-% IV SOLN
1.0000 g | Freq: Four times a day (QID) | INTRAVENOUS | Status: AC
  Administered 2019-06-19 (×3): 1 g via INTRAVENOUS
  Filled 2019-06-18 (×3): qty 50

## 2019-06-18 MED ORDER — PROPOFOL 10 MG/ML IV BOLUS
INTRAVENOUS | Status: AC
  Filled 2019-06-18: qty 20

## 2019-06-18 MED ORDER — ONDANSETRON HCL 4 MG/2ML IJ SOLN
INTRAMUSCULAR | Status: AC
  Filled 2019-06-18: qty 2

## 2019-06-18 MED ORDER — ONDANSETRON HCL 4 MG/2ML IJ SOLN
INTRAMUSCULAR | Status: DC | PRN
  Administered 2019-06-18: 4 mg via INTRAVENOUS

## 2019-06-18 MED ORDER — ONDANSETRON HCL 4 MG PO TABS: 4.0000 mg | ORAL_TABLET | Freq: Four times a day (QID) | ORAL | Status: DC | PRN

## 2019-06-18 MED ORDER — LACTATED RINGERS IV SOLN
INTRAVENOUS | Status: DC
  Administered 2019-06-18 – 2019-06-20 (×2): via INTRAVENOUS

## 2019-06-18 MED ORDER — FENTANYL CITRATE (PF) 100 MCG/2ML IJ SOLN
INTRAMUSCULAR | Status: DC | PRN
  Administered 2019-06-18 (×2): 50 ug via INTRAVENOUS

## 2019-06-18 MED ORDER — FENTANYL CITRATE (PF) 100 MCG/2ML IJ SOLN
INTRAMUSCULAR | Status: AC
  Administered 2019-06-18: 25 ug via INTRAVENOUS
  Filled 2019-06-18: qty 2

## 2019-06-18 MED ORDER — ONDANSETRON HCL 4 MG/2ML IJ SOLN: 4.0000 mg | Freq: Four times a day (QID) | INTRAMUSCULAR | Status: DC | PRN

## 2019-06-18 MED ORDER — ONDANSETRON HCL 4 MG/2ML IJ SOLN: 4.0000 mg | Freq: Once | INTRAMUSCULAR | Status: DC | PRN

## 2019-06-18 MED ORDER — LIDOCAINE HCL (CARDIAC) PF 100 MG/5ML IV SOSY
PREFILLED_SYRINGE | INTRAVENOUS | Status: DC | PRN
  Administered 2019-06-18: 50 mg via INTRAVENOUS

## 2019-06-18 MED ORDER — TAMSULOSIN HCL 0.4 MG PO CAPS
0.4000 mg | ORAL_CAPSULE | Freq: Every day | ORAL | Status: DC
  Administered 2019-06-19 – 2019-06-21 (×3): 0.4 mg via ORAL
  Filled 2019-06-18 (×3): qty 1

## 2019-06-18 MED ORDER — ENOXAPARIN SODIUM 40 MG/0.4ML ~~LOC~~ SOLN
40.0000 mg | SUBCUTANEOUS | Status: DC
  Administered 2019-06-19 – 2019-06-21 (×3): 40 mg via SUBCUTANEOUS
  Filled 2019-06-18 (×3): qty 0.4

## 2019-06-18 MED ORDER — SUGAMMADEX SODIUM 200 MG/2ML IV SOLN
INTRAVENOUS | Status: AC
  Filled 2019-06-18: qty 2

## 2019-06-18 MED ORDER — CEFAZOLIN SODIUM 1 G IJ SOLR
INTRAMUSCULAR | Status: AC
  Filled 2019-06-18: qty 20

## 2019-06-18 MED ORDER — MIDAZOLAM HCL 2 MG/2ML IJ SOLN
INTRAMUSCULAR | Status: DC | PRN
  Administered 2019-06-18: 1 mg via INTRAVENOUS

## 2019-06-18 MED ORDER — FENTANYL CITRATE (PF) 100 MCG/2ML IJ SOLN
25.0000 ug | INTRAMUSCULAR | Status: DC | PRN
  Administered 2019-06-18 (×4): 25 ug via INTRAVENOUS

## 2019-06-18 MED ORDER — LACTATED RINGERS IV SOLN
INTRAVENOUS | Status: DC | PRN
  Administered 2019-06-18: 19:00:00 via INTRAVENOUS

## 2019-06-18 MED ORDER — SODIUM CHLORIDE 0.9 % IR SOLN
Status: DC | PRN
  Administered 2019-06-18: 19:00:00 1000 mL

## 2019-06-18 SURGICAL SUPPLY — 39 items
BLADE TFNA HELICAL 95 STRL (Anchor) ×1 IMPLANT
BNDG COHESIVE 4X5 TAN STRL (GAUZE/BANDAGES/DRESSINGS) IMPLANT
BRUSH SCRUB EZ  4% CHG (MISCELLANEOUS) ×2
BRUSH SCRUB EZ 4% CHG (MISCELLANEOUS) ×2 IMPLANT
CANISTER SUCT 1200ML W/VALVE (MISCELLANEOUS) ×2 IMPLANT
CHLORAPREP W/TINT 26 (MISCELLANEOUS) ×2 IMPLANT
COVER WAND RF STERILE (DRAPES) ×2 IMPLANT
DRAPE SHEET LG 3/4 BI-LAMINATE (DRAPES) ×2 IMPLANT
DRAPE U-SHAPE 47X51 STRL (DRAPES) ×2 IMPLANT
DRSG OPSITE POSTOP 3X4 (GAUZE/BANDAGES/DRESSINGS) ×3 IMPLANT
DRSG OPSITE POSTOP 4X6 (GAUZE/BANDAGES/DRESSINGS) IMPLANT
DRSG OPSITE POSTOP 4X8 (GAUZE/BANDAGES/DRESSINGS) IMPLANT
ELECT REM PT RETURN 9FT ADLT (ELECTROSURGICAL) ×2
ELECTRODE REM PT RTRN 9FT ADLT (ELECTROSURGICAL) ×1 IMPLANT
GAUZE XEROFORM 1X8 LF (GAUZE/BANDAGES/DRESSINGS) ×2 IMPLANT
GLOVE BIO SURGEON STRL SZ 6.5 (GLOVE) ×1 IMPLANT
GLOVE BIO SURGEON STRL SZ7 (GLOVE) ×1 IMPLANT
GLOVE INDICATOR 7.0 STRL GRN (GLOVE) ×1 IMPLANT
GLOVE INDICATOR 7.5 STRL GRN (GLOVE) ×1 IMPLANT
GLOVE INDICATOR 8.0 STRL GRN (GLOVE) ×2 IMPLANT
GLOVE SURG ORTHO 8.0 STRL STRW (GLOVE) ×2 IMPLANT
GOWN STRL REUS W/ TWL LRG LVL3 (GOWN DISPOSABLE) ×1 IMPLANT
GOWN STRL REUS W/ TWL XL LVL3 (GOWN DISPOSABLE) ×1 IMPLANT
GOWN STRL REUS W/TWL LRG LVL3 (GOWN DISPOSABLE) ×1
GOWN STRL REUS W/TWL XL LVL3 (GOWN DISPOSABLE) ×3
GUIDEWIRE 3.2X400 (WIRE) ×1 IMPLANT
KIT PATIENT CARE HANA TABLE (KITS) ×2 IMPLANT
KIT TURNOVER CYSTO (KITS) ×2 IMPLANT
MAT ABSORB  FLUID 56X50 GRAY (MISCELLANEOUS) ×1
MAT ABSORB FLUID 56X50 GRAY (MISCELLANEOUS) ×1 IMPLANT
NAIL CAN TFNA 9 130D 380 RT (Nail) ×1 IMPLANT
NS IRRIG 1000ML POUR BTL (IV SOLUTION) ×2 IMPLANT
PACK HIP COMPR (MISCELLANEOUS) ×2 IMPLANT
REAMER ROD DEEP FLUTE 2.5X950 (INSTRUMENTS) ×1 IMPLANT
STAPLER SKIN PROX 35W (STAPLE) ×2 IMPLANT
SUT VIC AB 0 CT1 36 (SUTURE) ×2 IMPLANT
SUT VIC AB 2-0 CT1 27 (SUTURE) ×1
SUT VIC AB 2-0 CT1 TAPERPNT 27 (SUTURE) ×1 IMPLANT
TOWEL OR 17X26 4PK STRL BLUE (TOWEL DISPOSABLE) ×2 IMPLANT

## 2019-06-18 NOTE — Transfer of Care (Signed)
Immediate Anesthesia Transfer of Care Note  Patient: Andres [name]  Procedure(s) Performed: INTRAMEDULLARY (IM) NAIL INTERTROCHANTRIC (Right Hip)  Patient Location: PACU  Anesthesia Type:General  Level of Consciousness: awake and patient cooperative  Airway & Oxygen Therapy: Patient Spontanous Breathing and Patient connected to face mask oxygen  Post-op Assessment: Report given to RN and Post -op Vital signs reviewed and stable  Post vital signs: Reviewed and stable  Last Vitals:  Vitals Value Taken Time  BP 147/115 06/18/19 2007  Temp    Pulse 89 06/18/19 2007  Resp 12 06/18/19 2007  SpO2 100 % 06/18/19 2007  Vitals shown include unvalidated device data.  Last Pain:  Vitals:   06/18/19 0808  TempSrc:   PainSc: 0-No pain         Complications: No apparent anesthesia complications

## 2019-06-18 NOTE — Progress Notes (Signed)
Updated Mr. Gabriella Guile on pt's condition and plan of care. Pt did also allow pt to speak to his father.

## 2019-06-18 NOTE — Op Note (Signed)
DATE OF SURGERY:  06/18/2019  TIME: 8:03 PM  PATIENT NAME:  Andres [name]  AGE: 53 y.o.  PRE-OPERATIVE DIAGNOSIS:  Right Hip fracture  POST-OPERATIVE DIAGNOSIS:  SAME  PROCEDURE:  INTRAMEDULLARY (IM) NAIL INTERTROCHANTRIC  SURGEON:  Lovell Sheehan  EBL:  50 cc  COMPLICATIONS:  None apparent  OPERATIVE IMPLANTS: Synthes trochanteric femoral nail  380 mm by 9 mm  with interlocking helical blade  95 mm  PREOPERATIVE INDICATIONS:  [name] is a 53 y.o. year old who fell and suffered a hip fracture. He was brought into the ER and then admitted and optimized and then elected for surgical intervention.    The risks benefits and alternatives were discussed with the patient including but not limited to the risks of nonoperative treatment, versus surgical intervention including infection, bleeding, nerve injury, malunion, nonunion, hardware prominence, hardware failure, need for hardware removal, blood clots, cardiopulmonary complications, morbidity, mortality, among others, and they were willing to proceed.    OPERATIVE PROCEDURE:  The patient was brought to the operating room and placed in the supine position.  General anesthesia was administered, with a foley. He was placed on the fracture table.  Closed reduction was performed under C-arm guidance. The length of the femur was also measured using fluoroscopy. Time out was then performed after sterile prep and drape. He received preoperative antibiotics.  Incision was made proximal to the greater trochanter. A guidewire was placed in the appropriate position. Confirmation was made on AP and lateral views. The above-named nail was opened. I opened the proximal femur with a reamer. I then placed the nail by hand easily down. I did not need to ream the femur.  Once the nail was completely seated, I placed a guidepin into the femoral head into the center center position through a second incision.  I measured the length, and then reamed the  lateral cortex and up into the head. I then placed the helical blade. Slight compression was applied. Anatomic fixation achieved. Bone quality was poor.  I then secured the proximal interlock.  I then removed the instruments, and took final C-arm pictures AP and lateral the entire length of the leg. Anatomic reconstruction was achieved, and the wounds were irrigated copiously and closed with Vicryl  followed by staples and dry sterile dressing. Sponge and needle count were correct.   The patient was awakened and returned to PACU in stable and satisfactory condition. There no complications and the patient tolerated the procedure well.  He will be weightbearing as tolerated.    Lovell Sheehan

## 2019-06-18 NOTE — Anesthesia Preprocedure Evaluation (Signed)
Anesthesia Evaluation  Patient identified by MRN, date of birth, ID band Patient awake    Reviewed: Allergy & Precautions, NPO status , Patient's Chart, lab work & pertinent test results  History of Anesthesia Complications Negative for: history of anesthetic complications  Airway Mallampati: II       Dental   Pulmonary neg sleep apnea, neg COPD,           Cardiovascular hypertension, Pt. on medications and Pt. on home beta blockers (-) Past MI and (-) CHF (-) dysrhythmias (-) Valvular Problems/Murmurs     Neuro/Psych PSYCHIATRIC DISORDERS (mental retardation) Schizophrenia    GI/Hepatic Neg liver ROS, neg GERD  ,  Endo/Other  neg diabetesHypothyroidism   Renal/GU negative Renal ROS     Musculoskeletal   Abdominal   Peds  Hematology   Anesthesia Other Findings   Reproductive/Obstetrics                             Anesthesia Physical Anesthesia Plan  ASA: III and emergent  Anesthesia Plan: General   Post-op Pain Management:    Induction: Intravenous  PONV Risk Score and Plan: 2  Airway Management Planned: Oral ETT  Additional Equipment:   Intra-op Plan:   Post-operative Plan:   Informed Consent: I have reviewed the patients History and Physical, chart, labs and discussed the procedure including the risks, benefits and alternatives for the proposed anesthesia with the patient or authorized representative who has indicated his/her understanding and acceptance.       Plan Discussed with:   Anesthesia Plan Comments:         Anesthesia Quick Evaluation

## 2019-06-18 NOTE — Progress Notes (Signed)
Pt has no urine output in drainage bad. Pt educated on use of external catheter and encouraged to release bladder but still has not voided. Bladder scan show more than 974ml. Abdomen is firm and distended. MD notified.

## 2019-06-18 NOTE — Progress Notes (Signed)
OR given report on pt. Saline locked IV site and last CHG bath given. Pt informed of plan and called sister Butch Penny for comfort. Consent for blood and procedure on chart.

## 2019-06-18 NOTE — Anesthesia Postprocedure Evaluation (Signed)
Anesthesia Post Note  Patient: Andres [name]  Procedure(s) Performed: INTRAMEDULLARY (IM) NAIL INTERTROCHANTRIC (Right Hip)  Patient location during evaluation: PACU Anesthesia Type: General Level of consciousness: awake and alert Pain management: pain level controlled Vital Signs Assessment: post-procedure vital signs reviewed and stable Respiratory status: spontaneous breathing and respiratory function stable Cardiovascular status: stable Anesthetic complications: no     Last Vitals:  Vitals:   06/18/19 2056 06/18/19 2101  BP:    Pulse: 80 78  Resp: 16 17  Temp:    SpO2: 100% 100%    Last Pain:  Vitals:   06/18/19 2101  TempSrc:   PainSc: 5                  KEPHART,WILLIAM K

## 2019-06-18 NOTE — Plan of Care (Signed)
  Problem: Education: Goal: Verbalization of understanding the information provided (i.e., activity precautions, restrictions, etc) will improve Outcome: Progressing   Problem: Clinical Measurements: Goal: Postoperative complications will be avoided or minimized Outcome: Progressing

## 2019-06-18 NOTE — Progress Notes (Signed)
[place] [facility] [place] NAME: Andres [name]    MR#:  [id number]  DATE OF BIRTH:  Jul 26, 1966  SUBJECTIVE:  Patient without complaint, father at bedside, no events overnight, orthopedic surgery to see for possible operative intervention later today  REVIEW OF SYSTEMS:  CONSTITUTIONAL: No fever, fatigue or weakness.  EYES: No blurred or double vision.  EARS, NOSE, AND THROAT: No tinnitus or ear pain.  RESPIRATORY: No cough, shortness of breath, wheezing or hemoptysis.  CARDIOVASCULAR: No chest pain, orthopnea, edema.  GASTROINTESTINAL: No nausea, vomiting, diarrhea or abdominal pain.  GENITOURINARY: No dysuria, hematuria.  ENDOCRINE: No polyuria, nocturia,  HEMATOLOGY: No anemia, easy bruising or bleeding SKIN: No rash or lesion. MUSCULOSKELETAL: No joint pain or arthritis.   NEUROLOGIC: No tingling, numbness, weakness.  PSYCHIATRY: No anxiety or depression.   ROS  DRUG ALLERGIES:   Allergies  Allergen Reactions  . Prednisone Other (See Comments)    Did not like the way it made him feel   . Lamotrigine Rash    VITALS:  Blood pressure 123/81, pulse 60, temperature 98.6 F (37 C), resp. rate 20, height 5\' 6"  (1.676 m), weight 72.6 kg, SpO2 100 %.  PHYSICAL EXAMINATION:  GENERAL:  53 y.o.-year-old patient lying in the bed with no acute distress.  EYES: Pupils equal, round, reactive to light and accommodation. No scleral icterus. Extraocular muscles intact.  HEENT: Head atraumatic, normocephalic. Oropharynx and nasopharynx clear.  NECK:  Supple, no jugular venous distention. No thyroid enlargement, no tenderness.  LUNGS: Normal breath sounds bilaterally, no wheezing, rales,rhonchi or crepitation. No use of accessory muscles of respiration.  CARDIOVASCULAR: S1, S2 normal. No murmurs, rubs, or gallops.  ABDOMEN: Soft, nontender, nondistended. Bowel sounds present. No organomegaly or mass.  EXTREMITIES: No pedal edema, cyanosis, or clubbing.   NEUROLOGIC: Cranial nerves II through XII are intact. Muscle strength 5/5 in all extremities. Sensation intact. Gait not checked.  PSYCHIATRIC: The patient is alert and oriented x 3.  SKIN: No obvious rash, lesion, or ulcer.   Physical Exam LABORATORY PANEL:   CBC Recent Labs  Lab 06/17/19 1338  WBC 7.4  HGB 14.0  HCT 41.7  PLT 103*   ------------------------------------------------------------------------------------------------------------------  Chemistries  Recent Labs  Lab 06/17/19 1338  NA 145  K 3.8  CL 113*  CO2 20*  GLUCOSE 146*  BUN 21*  CREATININE 1.04  CALCIUM 8.8*  AST 31  ALT 27  ALKPHOS 56  BILITOT 0.5   ------------------------------------------------------------------------------------------------------------------  Cardiac Enzymes No results for input(s): TROPONINI in the last 168 hours. ------------------------------------------------------------------------------------------------------------------  RADIOLOGY:  Dg Chest 1 View  Result Date: 06/17/2019 CLINICAL DATA:  Preop exam. EXAM: CHEST  1 VIEW COMPARISON:  None. FINDINGS: Lungs are adequately inflated and otherwise clear. Cardiomediastinal silhouette is normal. Bones and soft tissues are within normal. IMPRESSION: No active disease. Electronically Signed   By: Marin Olp M.D.   On: 06/17/2019 14:34   Dg Hip Unilat  With Pelvis 2-3 Views Right  Result Date: 06/17/2019 CLINICAL DATA:  Fall 2 days ago.  Unable to bear weight. EXAM: DG HIP (WITH OR WITHOUT PELVIS) 2-3V RIGHT COMPARISON:  None. FINDINGS: Mild diffuse decreased bone mineralization. Mild symmetric degenerative change of the hips. There is a displaced right femoral intertrochanteric fracture with possible minimal comminution. Superolateral displacement of the distal fragment. Remainder of the exam is unremarkable. IMPRESSION: Displaced right femoral intertrochanteric fracture with possible mild comminution. Electronically Signed    By: Marin Olp M.D.  On: 06/17/2019 14:33   Dg Femur Min 2 Views Right  Result Date: 06/17/2019 CLINICAL DATA:  Larey SeatFell 2 days ago.  Right hip pain. EXAM: RIGHT FEMUR 2 VIEWS COMPARISON:  None. FINDINGS: There is a displaced comminuted intertrochanteric fracture of the right hip with a significant varus deformity and shortening. The visualized right hemipelvis is intact. No femoral shaft fracture. The knee joint is maintained. IMPRESSION: Displaced comminuted intertrochanteric fracture of the right hip. No femoral shaft fracture. Electronically Signed   By: Rudie MeyerP.  Gallerani M.D.   On: 06/17/2019 14:46    ASSESSMENT AND PLAN:  53 y.o. male with pertinent past medical history of mental retardation, thrombocytopenia, allergic state, depression, chronic bilateral lower extremity edema, hypothyroidism, history of fracture and plate placement in left leg, drug induced tremors presenting to the ED with right hip pain.  * Acute right hip fracture secondary to unwitnessed fall Orthopedic surgery to see, continue to pain protocol  *Acute fall felt slightly dizzy and legs gave way, did not hit head PT/OT consult tomorrow   *Chronic thrombocytopenia Stable No signs of active bleeding  *Chronic developmental disability with mood disorder Stable Continue home psychotropic regiment Follows with Dr. Elna BreslowEappen  DVT prophylaxis with SCDs GI prophylaxis with PPI daily Disposition pending clinical course  All the records are reviewed and case discussed with Care Management/Social Workerr. Management plans discussed with the patient, family and they are in agreement.  CODE STATUS: full  TOTAL TIME TAKING CARE OF THIS PATIENT: 35 minutes.     POSSIBLE D/C IN 2-4 DAYS, DEPENDING ON CLINICAL CONDITION.   Evelena AsaMontell D Kalab [name] M.D on 06/18/2019   Between 7am to 6pm - Pager - [phone]  After 6pm go to [email or web address] - password [email or web address]  [facility] [place] [facility]  Office   [phone]  CC: Primary care physician; Smitty CordsKaramalegos, Alexander J, DO  Note: This dictation was prepared with Dragon dictation along with smaller phrase technology. Any transcriptional errors that result from this process are unintentional.

## 2019-06-18 NOTE — Progress Notes (Signed)
Father is at bedside with pt. Was educated on infection control and universal masking prior to entering unit. He is wearing a mask and awaiting pt call to OR. Pt is asymptomatic and resting quietly. Bed in lowest position, call on, and call bell is in reach.

## 2019-06-18 NOTE — Progress Notes (Addendum)
36fr/10ml foley catheter placed to urethra per MD orders using sterile technique. Pt tolerated procedure well. 386ml of  clear, straw-colored, urine immediately returned. Urine drainage continues slowly. Will continue to monitor.

## 2019-06-18 NOTE — Consult Note (Signed)
ORTHOPAEDIC CONSULTATION  REQUESTING PHYSICIAN: Salary, Evelena AsaMontell D, MD  Chief Complaint: right hip pain  HPI: Andres [name] is a 53 y.o. male who complains of right hip pain. Please see H&P and ED notes for details. Denies any numbness, tingling or constitutional symptoms.  Past Medical History:  Diagnosis Date  . Allergy    Past Surgical History:  Procedure Laterality Date  . ANKLE SURGERY    . COLON SURGERY    . HERNIA REPAIR    . TONSILLECTOMY     Social History   Socioeconomic History  . Marital status: Single    Spouse name: Not on file  . Number of children: 0  . Years of education: [employer]  . Highest education level: High school graduate  Occupational History    Comment: disabled  Social Needs  . Financial resource strain: Not hard at all  . Food insecurity    Worry: Never true    Inability: Never true  . Transportation needs    Medical: Yes    Non-medical: Yes  Tobacco Use  . Smoking status: Never Smoker  . Smokeless tobacco: Never Used  Substance and Sexual Activity  . Alcohol use: No    Frequency: Never  . Drug use: No  . Sexual activity: Not Currently  Lifestyle  . Physical activity    Days per week: 0 days    Minutes per session: 0 min  . Stress: Not at all  Relationships  . Social connections    Talks on phone: More than three times a week    Gets together: More than three times a week    Attends religious service: Never    Active member of club or organization: No    Attends meetings of clubs or organizations: Never    Relationship status: Never married  Other Topics Concern  . Not on file  Social History Narrative   Lives with parents, mental handicap, uses walker   Family History  Problem Relation Age of Onset  . Diabetes Mother   . Stroke Mother   . Cancer Father        colon  . Colon cancer Father   . Colon cancer Other   . Mental illness Neg Hx    Allergies  Allergen Reactions  . Prednisone Other (See Comments)   Did not like the way it made him feel   . Lamotrigine Rash   Prior to Admission medications   Medication Sig Start Date End Date Taking? Authorizing Provider  allopurinol (ZYLOPRIM) 300 MG tablet Take 1 tablet (300 mg total) by mouth daily. 07/07/18  Yes Karamalegos, Netta NeatAlexander J, DO  cholecalciferol (VITAMIN D) 1000 units tablet Take 1,000 Units by mouth daily.   Yes [provider]  citalopram (CELEXA) 10 MG tablet Take 1 tablet (10 mg total) by mouth daily. 06/13/19  Yes Jomarie LongsEappen, Saramma, MD  fexofenadine (ALLEGRA) 180 MG tablet Take 180 mg by mouth.   Yes [provider]  levothyroxine (SYNTHROID) 25 MCG tablet TAKE 1 TABLET BY MOUTH DAILY BEFORE BREAKFAST. ON EMPTY STOMACH, NOT TO TAKE WITH OTHER MEDICINES 05/04/19  Yes Althea CharonKaramalegos, Netta NeatAlexander J, DO  propranolol (INDERAL) 10 MG tablet Take 1 tablet (10 mg total) by mouth 2 (two) times daily as needed. For severe anxiety/agitation 06/13/19  Yes Eappen, Levin BaconSaramma, MD  QUEtiapine (SEROQUEL) 25 MG tablet Take 0.5 tablets (12.5 mg total) by mouth daily as needed. For severe anxiety/agitation Patient taking differently: Take 12.5 mg by mouth daily as needed.  For severe anxiety/agitationDR EAPEN 05/10/18  Yes Ursula Alert, MD  thiamine (VITAMIN B-1) 100 MG tablet Take 100 mg by mouth daily.   Yes [provider]  traZODone (DESYREL) 50 MG tablet Take 0.5 tablets (25 mg total) by mouth at bedtime as needed for sleep. 06/13/19  Yes Eappen, Ria Clock, MD  Valbenazine Tosylate (INGREZZA) 80 MG CAPS Take 80 mg by mouth at bedtime. 11/07/18  Yes Ursula Alert, MD  QUEtiapine (SEROQUEL) 50 MG tablet Take 1 tablet (50 mg total) by mouth at bedtime. Patient not taking: Reported on 06/17/2019 06/13/19   Ursula Alert, MD   Dg Chest 1 View  Result Date: 06/17/2019 CLINICAL DATA:  Preop exam. EXAM: CHEST  1 VIEW COMPARISON:  None. FINDINGS: Lungs are adequately inflated and otherwise clear. Cardiomediastinal silhouette is normal. Bones and  soft tissues are within normal. IMPRESSION: No active disease. Electronically Signed   By: Marin Olp M.D.   On: 06/17/2019 14:34   Dg Hip Unilat  With Pelvis 2-3 Views Right  Result Date: 06/17/2019 CLINICAL DATA:  Fall 2 days ago.  Unable to bear weight. EXAM: DG HIP (WITH OR WITHOUT PELVIS) 2-3V RIGHT COMPARISON:  None. FINDINGS: Mild diffuse decreased bone mineralization. Mild symmetric degenerative change of the hips. There is a displaced right femoral intertrochanteric fracture with possible minimal comminution. Superolateral displacement of the distal fragment. Remainder of the exam is unremarkable. IMPRESSION: Displaced right femoral intertrochanteric fracture with possible mild comminution. Electronically Signed   By: Marin Olp M.D.   On: 06/17/2019 14:33   Dg Femur Min 2 Views Right  Result Date: 06/17/2019 CLINICAL DATA:  [place] 2 days ago.  Right hip pain. EXAM: RIGHT FEMUR 2 VIEWS COMPARISON:  None. FINDINGS: There is a displaced comminuted intertrochanteric fracture of the right hip with a significant varus deformity and shortening. The visualized right hemipelvis is intact. No femoral shaft fracture. The knee joint is maintained. IMPRESSION: Displaced comminuted intertrochanteric fracture of the right hip. No femoral shaft fracture. Electronically Signed   By: Marijo Sanes M.D.   On: 06/17/2019 14:46    Positive ROS: All other systems have been reviewed and were otherwise negative with the exception of those mentioned in the HPI and as above.  Physical Exam: General: Alert, no acute distress Cardiovascular: No pedal edema Respiratory: No cyanosis, no use of accessory musculature GI: No organomegaly, abdomen is soft and non-tender Skin: No lesions in the area of chief complaint Neurologic: Sensation intact distally Psychiatric: Patient is competent for consent with normal mood and affect Lymphatic: No axillary or cervical lymphadenopathy  MUSCULOSKELETAL: right lower  extremity short, externally rotated, pain with movement. Compartments soft. Good cap refill. Motor and sensory intact distally.  Assessment: Right hip intertrochanteric fracture, closed, displaced  Plan: Plan a trochanteric femoral nail.  The diagnosis, risks, benefits and alternatives to treatment are all discussed in detail with the patient and father. Risks include but are not limited to bleeding, infection, deep vein thrombosis, pulmonary embolism, nerve or vascular injury, non-union, repeat operation, persistent pain, weakness, stiffness and death. His father understands and is eager to proceed.     Lovell Sheehan, MD    06/18/2019 5:47 PM

## 2019-06-18 NOTE — Anesthesia Post-op Follow-up Note (Signed)
Anesthesia QCDR form completed.        

## 2019-06-18 NOTE — Anesthesia Procedure Notes (Signed)
Procedure Name: Intubation Date/Time: 06/18/2019 6:48 PM Performed by: Lendon Colonel, CRNA Pre-anesthesia Checklist: Patient identified, Patient being monitored, Timeout performed, Emergency Drugs available and Suction available Patient Re-evaluated:Patient Re-evaluated prior to induction Oxygen Delivery Method: Circle system utilized Preoxygenation: Pre-oxygenation with 100% oxygen Induction Type: IV induction Ventilation: Mask ventilation without difficulty Laryngoscope Size: Mac and 3 Grade View: Grade I Tube type: Oral Tube size: 7.0 mm Number of attempts: 1 Airway Equipment and Method: Stylet Placement Confirmation: ETT inserted through vocal cords under direct vision,  positive ETCO2 and breath sounds checked- equal and bilateral Secured at: 21 cm Tube secured with: Tape Dental Injury: Teeth and Oropharynx as per pre-operative assessment

## 2019-06-18 NOTE — Progress Notes (Signed)
   06/18/19 1000  Clinical Encounter Type  Visited With Patient and family together  Visit Type Initial;Spiritual support  Stress Factors  Patient Stress Factors Health changes  Ch was rounding. Pt is very talkative and friendly. Pt shared about his religious convictions regarding the afterlife and judgment. His religious beliefs shape his world view. Pt can be seen as to stand more on the conservative side of Christianity.Pt shared his close relationship with the Divine and grieved the losses that come with the aging process such as being unable to walk as well or mow the lawn like he used to. Pt enjoys talking and would benefit from further visit. Ch will follow up to check in.

## 2019-06-19 ENCOUNTER — Encounter: Payer: Self-pay | Admitting: Orthopedic Surgery

## 2019-06-19 LAB — BASIC METABOLIC PANEL
Anion gap: 12 (ref 5–15)
BUN: 15 mg/dL (ref 6–20)
CO2: 22 mmol/L (ref 22–32)
Calcium: 8.1 mg/dL — ABNORMAL LOW (ref 8.9–10.3)
Chloride: 111 mmol/L (ref 98–111)
Creatinine, Ser: 0.88 mg/dL (ref 0.61–1.24)
GFR calc Af Amer: >60 mL/min (ref >60)
GFR calc non Af Amer: >60 mL/min (ref >60)
Glucose, Bld: 111 mg/dL — ABNORMAL HIGH (ref 70–99)
Potassium: 3.7 mmol/L (ref 3.5–5.1)
Sodium: 145 mmol/L (ref 135–145)

## 2019-06-19 LAB — CBC
HCT: 34 % — ABNORMAL LOW (ref 39.0–52.0)
Hemoglobin: 11.4 g/dL — ABNORMAL LOW (ref 13.0–17.0)
MCH: 30.8 pg (ref 26.0–34.0)
MCHC: 33.5 g/dL (ref 30.0–36.0)
MCV: 91.9 fL (ref 80.0–100.0)
Platelets: 120 10*3/uL — ABNORMAL LOW (ref 150–400)
RBC: 3.7 MIL/uL — ABNORMAL LOW (ref 4.22–5.81)
RDW: 13.2 % (ref 11.5–15.5)
WBC: 5.4 10*3/uL (ref 4.0–10.5)
nRBC: 0 % (ref 0.0–0.2)

## 2019-06-19 LAB — HIV ANTIBODY (ROUTINE TESTING W REFLEX): HIV Screen 4th Generation wRfx: NONREACTIVE

## 2019-06-19 NOTE — Progress Notes (Signed)
Physical Therapy Treatment Patient Details Name: Andres [name] MRN: [id number] DOB: 1966/08/27 Today's Date: 06/19/2019    History of Present Illness 53 y/o male here after fall with R hip fx, IM nailing 06/18/19.    PT Comments    Pt did better with mobility tasks and with tolerance of exercises this afternoon but remains very functionally limited and struggles to consistently participate with LE exercises.  Pt with better tolerance to standing and weight shifts with transfer from recliner to bed but still unable to actually step in a more than shuffling highly guarded way.  Pt continues to c/o pain, but less self limiting with exercises than AM session.   Follow Up Recommendations  SNF     Equipment Recommendations  None recommended by PT    Recommendations for Other Services       Precautions / Restrictions Precautions Precautions: Fall Restrictions Weight Bearing Restrictions: Yes RLE Weight Bearing: Weight bearing as tolerated    Mobility  Bed Mobility Overal bed mobility: Needs Assistance Bed Mobility: Sit to Supine     Supine to sit: Mod assist;Max assist Sit to supine: Max assist;Mod assist   General bed mobility comments: Pt able to assist minimally by trying to put elbow/torso down but ultimately needed considerable assist to get back to supine  Transfers Overall transfer level: Needs assistance Equipment used: Rolling walker (2 wheeled) Transfers: Sit to/from Stand Sit to Stand: Min assist;Mod assist         General transfer comment: 1 person assist with cues for appropriate hand use, set and sequencing  Ambulation/Gait Ambulation/Gait assistance: Mod assist;Max assist;+2 physical assistance Gait Distance (Feet): 3 Feet Assistive device: Rolling walker (2 wheeled)       General Gait Details: Pt did slighly better with tolerance of R WBing and moving feet w/o direct physical assist but in actuality still did little more than very small shuffling  steps with no real ambulation recliner to bed   Stairs             Wheelchair Mobility    Modified Rankin (Stroke Patients Only)       Balance Overall balance assessment: Needs assistance Sitting-balance support: Bilateral upper extremity supported Sitting balance-Leahy Scale: Fair Sitting balance - Comments: Pt with posterior lean and need for UEs to stay upright     Standing balance-Leahy Scale: Poor Standing balance comment: reliance on walker as well as direct physical assist to maintain standing                            Cognition Arousal/Alertness: Awake/alert Behavior During Therapy: Anxious;Restless Overall Cognitive Status: History of cognitive impairments - at baseline                                 General Comments: Pt again perseverating on off topic matter t/o the session      Exercises General Exercises - Lower Extremity Ankle Circles/Pumps: AROM;10 reps Quad Sets: Strengthening;10 reps Short Arc Quad: 10 reps;AAROM;AROM Heel Slides: PROM;AAROM;10 reps Hip ABduction/ADduction: AROM;10 reps    General Comments        Pertinent Vitals/Pain Pain Assessment: 0-10 Pain Score: 5  Pain Location: indicates minimal pain at rest and much higher pain with R hip mvt    Home Living Family/patient expects to be discharged to:: Private residence Living Arrangements: Parent Available Help at Discharge: (other family able to  help)   Home Access: Stairs to enter Entrance Stairs-Rails: Can reach both Home Layout: One level Home Equipment: Walker - 2 wheels;Walker - 4 wheels;Walker - standard      Prior Function Level of Independence: Independent with assistive device(s)      Comments: Pt able to dress, use toilet, etc w/o assist from father.  Rarely out of the home   PT Goals (current goals can now be found in the care plan section) Acute Rehab PT Goals Patient Stated Goal: Pt perseverating on pain, wants to walk again PT  Goal Formulation: With patient/family Time For Goal Achievement: 07/03/19 Potential to Achieve Goals: Fair Progress towards PT goals: Progressing toward goals    Frequency    BID      PT Plan Current plan remains appropriate    Co-evaluation              AM-PAC PT "6 Clicks" Mobility   Outcome Measure  Help needed turning from your back to your side while in a flat bed without using bedrails?: A Lot Help needed moving from lying on your back to sitting on the side of a flat bed without using bedrails?: Total Help needed moving to and from a bed to a chair (including a wheelchair)?: Total Help needed standing up from a chair using your arms (e.g., wheelchair or bedside chair)?: Total Help needed to walk in hospital room?: Total Help needed climbing 3-5 steps with a railing? : Total 6 Click Score: 7    End of Session Equipment Utilized During Treatment: Gait belt Activity Tolerance: Patient limited by pain Patient left: with call bell/phone within reach;with bed alarm set Nurse Communication: Mobility status PT Visit Diagnosis: Muscle weakness (generalized) (M62.81);Pain;Difficulty in walking, not elsewhere classified (R26.2) Pain - Right/Left: Right Pain - part of body: Hip     Time: 1420-1445 PT Time Calculation (min) (ACUTE ONLY): 25 min  Charges:  $Therapeutic Exercise: 8-22 mins $Therapeutic Activity: 8-22 mins                     Malachi ProGalen R Jonan Seufert, DPT 06/19/2019, 4:32 PM

## 2019-06-19 NOTE — Progress Notes (Signed)
   06/19/19 1000  Clinical Encounter Type  Visited With Patient and family together  Visit Type Follow-up  Stress Factors  Patient Stress Factors Health changes  Ch went for a follow up visit. Pt was sitting up in a chair and his father was present. Pt did not talk much but pt's father expressed his hope for son's quick recovery and shared his experience of going through a hip surgery. Ch left so that the pt can get rest.

## 2019-06-19 NOTE — Evaluation (Signed)
Physical Therapy Evaluation Patient Details Name: Andres [name] MRN: [id number] DOB: [birth year] Today's Date: 06/19/2019   History of Present Illness  53 y/o male here after fall with R hip fx, IM nailing 06/18/19.  Clinical Impression  Pt hesitant to do a lot with PT secondary to pain and general anxiety/fear of his situation.  He was able to tolerate some bed exercises and and minimal standing/transfer to recliner but needed considerable assist with all tasks.  He was able to do some AROM exercises, but with all hip tasks needed near PROM secondary to pain, weakness and fear.  Pt showed some hesitant effort with standing/attempting taking steps but needed a lot of assist while upright and generally showed poor tolerance.  H/o L ankle sx with lateral posturing that is present in standing.  Pt not nearly mobile enough at this time to be able to go home safely, recommending STR once medically ready for d/c.     Follow Up Recommendations SNF    Equipment Recommendations  None recommended by PT    Recommendations for Other Services       Precautions / Restrictions Precautions Precautions: Fall Restrictions Weight Bearing Restrictions: Yes RLE Weight Bearing: Weight bearing as tolerated      Mobility  Bed Mobility Overal bed mobility: Needs Assistance Bed Mobility: Supine to Sit     Supine to sit: Mod assist;Max assist     General bed mobility comments: Pt very hesitant to get up, very limited needing heavy assist and a lot of encouragement  Transfers Overall transfer level: Needs assistance Equipment used: Rolling walker (2 wheeled) Transfers: Sit to/from Stand Sit to Stand: Mod assist;+2 physical assistance         General transfer comment: Pt needed a lot of cuing, reinforcement and encouragement getting to standing with heavy assist  Ambulation/Gait Ambulation/Gait assistance: Mod assist;Max assist;+2 physical assistance Gait Distance (Feet): 3 Feet Assistive  device: Rolling walker (2 wheeled)       General Gait Details: Pt is able to stuggle to take a few small steps, but needed heavy assist, cuing.  Fearful and guarded t/o the effort and needed direct assist at times to advance R LE  Stairs            Wheelchair Mobility    Modified Rankin (Stroke Patients Only)       Balance Overall balance assessment: Needs assistance   Sitting balance-Leahy Scale: Fair       Standing balance-Leahy Scale: Poor Standing balance comment: reliance on walker as well as direct physical assist to maintain standing                             Pertinent Vitals/Pain Pain Assessment: (no pain at rest, sensitive to any movement of R LE)    Home Living Family/patient expects to be discharged to:: Private residence Living Arrangements: Parent Available Help at Discharge: (other family able to help)   Home Access: Stairs to enter Entrance Stairs-Rails: Can reach both Entrance Stairs-Number of Steps: 2 at back (5 at front) Home Layout: One level Home Equipment: Walker - 2 wheels;Walker - 4 wheels;Walker - standard      Prior Function Level of Independence: Independent with assistive device(s)         Comments: Pt able to dress, use toilet, etc w/o assist from father.  Rarely out of the home     Hand Dominance  Extremity/Trunk Assessment                Communication   Communication: No difficulties  Cognition Arousal/Alertness: Awake/alert Behavior During Therapy: Anxious;Restless Overall Cognitive Status: History of cognitive impairments - at baseline                                 General Comments: Pt perseverating on off topic matters the session      General Comments      Exercises General Exercises - Lower Extremity Ankle Circles/Pumps: AROM;10 reps;Both Quad Sets: Strengthening;5 reps;Both Heel Slides: PROM;AAROM;5 reps(poor tolerance on R) Hip ABduction/ADduction:  AAROM;AROM;10 reps   Assessment/Plan    PT Assessment Patient needs continued PT services  PT Problem List Decreased strength;Decreased activity tolerance;Decreased range of motion;Decreased balance;Decreased mobility;Decreased coordination;Decreased cognition;Decreased knowledge of use of DME;Decreased safety awareness;Pain       PT Treatment Interventions DME instruction;Gait training;Stair training;Functional mobility training;Therapeutic activities;Therapeutic exercise;Balance training;Neuromuscular re-education;Cognitive remediation;Patient/family education    PT Goals (Current goals can be found in the Care Plan section)  Acute Rehab PT Goals Patient Stated Goal: Pt perseverating on pain, wants to walk again PT Goal Formulation: With patient/family Time For Goal Achievement: 07/03/19 Potential to Achieve Goals: Fair    Frequency BID   Barriers to discharge        Co-evaluation               AM-PAC PT "6 Clicks" Mobility  Outcome Measure Help needed turning from your back to your side while in a flat bed without using bedrails?: A Lot Help needed moving from lying on your back to sitting on the side of a flat bed without using bedrails?: Total Help needed moving to and from a bed to a chair (including a wheelchair)?: Total Help needed standing up from a chair using your arms (e.g., wheelchair or bedside chair)?: Total Help needed to walk in hospital room?: Total Help needed climbing 3-5 steps with a railing? : Total 6 Click Score: 7    End of Session Equipment Utilized During Treatment: Gait belt Activity Tolerance: Patient limited by pain Patient left: with chair alarm set;with call bell/phone within reach;with family/visitor present Nurse Communication: Mobility status PT Visit Diagnosis: Muscle weakness (generalized) (M62.81);Pain;Difficulty in walking, not elsewhere classified (R26.2) Pain - Right/Left: Right Pain - part of body: Hip    Time: [id number] PT  Time Calculation (min) (ACUTE ONLY): 29 min   Charges:   PT Evaluation $PT Eval Low Complexity: 1 Low PT Treatments $Therapeutic Exercise: 8-22 mins        Malachi ProGalen R Keshana Klemz, DPT 06/19/2019, 1:50 PM

## 2019-06-19 NOTE — TOC Initial Note (Signed)
Transition of Care [facility]) - Initial/Assessment Note    Patient Details  Name: Andres [name] MRN: [id number] Date of Birth: July 08, 1966  Transition of Care [facility]) CM/SW Contact:    Yohance Hathorne, Lenice Llamas Phone Number: [phone]  06/19/2019, 5:15 PM  Clinical Narrative:  PT is recommending SNF. Per RN patient has an IDD diagnosis. Clinical Education officer, museum (CSW) met with patient and his father Kenton Kingfisher was at bedside. Per Kenton Kingfisher patient lives with him in [place]. CSW explained SNF process and that [facility] will have to approve SNF. Patient and his father Kenton Kingfisher are agreeable to SNF search in [facility] and prefer [facility]. FL2 complete and faxed out. PASARR is pending. CSW will continue to follow and assist as needed.             Expected Discharge Plan: Skilled Nursing Facility Barriers to Discharge: Continued Medical Work up   Patient Goals and CMS Choice Patient states their goals for this hospitalization and ongoing recovery are:: Pain control.      Expected Discharge Plan and Services Expected Discharge Plan: [place] In-house Referral: Clinical Social Work Discharge Planning Services: CM Consult   Living arrangements for the past 2 months: B and E                                      Prior Living Arrangements/Services Living arrangements for the past 2 months: Single Family Home Lives with:: Parents Patient language and need for interpreter reviewed:: No Do you feel safe going back to the place where you live?: Yes      Need for Family Participation in Patient Care: Yes (Comment) Care giver support system in place?: Yes (comment)   Criminal Activity/Legal Involvement Pertinent to Current Situation/Hospitalization: No - Comment as needed  Activities of Daily Living Home Assistive Devices/Equipment: Environmental consultant (specify type), Shower chair with back ADL Screening (condition at time of admission) Patient's cognitive ability adequate to safely  complete daily activities?: No Is the patient deaf or have difficulty hearing?: No Does the patient have difficulty seeing, even when wearing glasses/contacts?: No Does the patient have difficulty concentrating, remembering, or making decisions?: Yes Patient able to express need for assistance with ADLs?: No Does the patient have difficulty dressing or bathing?: No Independently performs ADLs?: Yes (appropriate for developmental age) Does the patient have difficulty walking or climbing stairs?: Yes Weakness of Legs: Left Weakness of Arms/Hands: None  Permission Sought/Granted Permission sought to share information with : Chartered certified accountant granted to share information with : Yes, Verbal Permission Granted              Emotional Assessment Appearance:: Appears stated age   Affect (typically observed): Pleasant, Calm Orientation: : Oriented to Self, Oriented to Place, Oriented to  Time, Fluctuating Orientation (Suspected and/or reported Sundowners) Alcohol / Substance Use: Not Applicable Psych Involvement: No (comment)  Admission diagnosis:  Pre-op chest exam [C16.384] Closed fracture of right hip, initial encounter [facility]) [S72.001A] Patient Active Problem List   Diagnosis Date Noted  . Closed comminuted fracture of right hip ([place]) 06/17/2019  . Loose stools 07/07/2018  . Weight loss 06/09/2018  . Hypothyroidism 02/28/2018  . Vitamin D deficiency, unspecified 02/28/2018  . 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 Development delay 01/18/2018  . 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 Localized edema 09/26/2015  . Lack of expected normal physiological development 05/09/2015  . FHx: colon  cancer 05/09/2015  . Essential hypertension 05/09/2015  . 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 Tibia/fibula fracture 08/21/2013  . Seasonal allergies 08/21/2013  . Allergic rhinitis 08/21/2013  . Schizoaffective disorder ([place]) 05/05/2004   PCP:  Olin Hauser, DO Pharmacy:   [facility] [phone] [address], [place] [address] [place] [address] Phone: [phone] Fax: [phone]  [place], [place] [address] [place] [address] Phone: [phone] Fax: [phone]     Social Determinants of Health (SDOH) Interventions    Readmission Risk Interventions No flowsheet data found.

## 2019-06-19 NOTE — [place] FL2 (Addendum)
[place] MEDICAID FL2 LEVEL OF CARE SCREENING TOOL     IDENTIFICATION  Patient Name: Andres [name] Birthdate: 07/27/1966 Sex: male Admission Date (Current Location): 06/17/2019  [facility] [place] [place]   Facility and Address:    [facility]   [address]   [place], [place] [address]        Provider Number: [id number]  Attending Physician Name and Address:  Altamese DillingVachhani, Vaibhavkumar, *  Relative Name and Phone Number:       Current Level of Care: Hospital Recommended Level of Care: Skilled Nursing Facility Prior Approval Number:    Date Approved/Denied:   PASRR Number:    Discharge Plan: SNF    Current Diagnoses: Patient Active Problem List   Diagnosis Date Noted  . Closed comminuted fracture of right hip ([facility]) 06/17/2019  . Loose stools 07/07/2018  . Weight loss 06/09/2018  . Hypothyroidism 02/28/2018  . Vitamin D deficiency, unspecified 02/28/2018  . 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 Development delay 01/18/2018  . 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 Localized edema 09/26/2015  . Lack of expected normal physiological development 05/09/2015  . FHx: colon cancer 05/09/2015  . Essential hypertension 05/09/2015  . 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 Tibia/fibula fracture 08/21/2013  . Seasonal allergies 08/21/2013  . Allergic rhinitis 08/21/2013  . Schizoaffective disorder (H[facility]) 05/05/2004    Orientation RESPIRATION BLADDER Height & Weight     Self, Time, Place  Normal Continent Weight: 160 lb (72.6 kg) Height:  5\' 6"  (167.6 cm)  BEHAVIORAL SYMPTOMS/MOOD NEUROLOGICAL BOWEL NUTRITION STATUS      Continent Diet(Diet: Regular)  AMBULATORY STATUS COMMUNICATION OF NEEDS Skin   Extensive Assist Verbally Surgical wounds(Incision: Right Hip.)                       Personal Care Assistance Level of Assistance  Bathing, Feeding, Dressing Bathing Assistance: Limited assistance Feeding assistance:  Limited assistance Dressing Assistance: Limited assistance     Functional Limitations Info  Sight, Hearing, Speech Sight Info: Adequate Hearing Info: Adequate Speech Info: Adequate    SPECIAL CARE FACTORS FREQUENCY  PT (By licensed PT), OT (By licensed OT)     PT Frequency: 5 OT Frequency: 5            Contractures      Additional Factors Info  Code Status, Allergies Code Status Info: Full Code. Allergies Info: Prednisone, Lamotrigine           Current Medications (06/19/2019):  This is the current hospital active medication list Current Facility-Administered Medications  Medication Dose Route Frequency Provider Last Rate Last Dose  . 0.9 %  sodium chloride infusion   Intravenous Continuous [name], [name], MD 75 mL/hr at 06/19/19 1306    . allopurinol (ZYLOPRIM) tablet 300 mg  300 mg Oral Daily [name], [name], MD   300 mg at 06/19/19 1005  . cholecalciferol (VITAMIN D3) tablet 1,000 Units  1,000 Units Oral Daily [name], [name], MD   1,000 Units at 06/19/19 0802  . citalopram (CELEXA) tablet 10 mg  10 mg Oral Daily [name], [name], MD   10 mg at 06/19/19 0801  . docusate sodium (COLACE) capsule 100 mg  100 mg Oral BID [name], [name], MD   100 mg at 06/19/19 0801  . enoxaparin (LOVENOX) injection 40 mg  40 mg Subcutaneous Q24H [name], [name], MD   40 mg at 06/19/19 0802  . HYDROcodone-acetaminophen (NORCO/VICODIN) 5-325  MG per tablet 1-2 tablet  1-2 tablet Oral Q6H PRN Lovell Sheehan, MD   1 tablet at 06/17/19 2148  . lactated ringers infusion   Intravenous Continuous Lovell Sheehan, MD 75 mL/hr at 06/18/19 2227    . levothyroxine (SYNTHROID) tablet 25 mcg  25 mcg Oral Q0600 Lovell Sheehan, MD   25 mcg at 06/19/19 [phone]  . loratadine (CLARITIN) tablet 10 mg  10 mg Oral Daily Lovell Sheehan, MD   10 mg at 06/19/19 0801  . metoCLOPramide (REGLAN) tablet 5-10 mg  5-10 mg Oral Q8H PRN Lovell Sheehan, MD       Or  . metoCLOPramide (REGLAN) injection 5-10 mg  5-10 mg  Intravenous Q8H PRN Lovell Sheehan, MD      . morphine 2 MG/ML injection 0.5 mg  0.5 mg Intravenous Q2H PRN Lovell Sheehan, MD   0.5 mg at 06/17/19 2015  . ondansetron (ZOFRAN) tablet 4 mg  4 mg Oral Q6H PRN Lovell Sheehan, MD       Or  . ondansetron [facility]) injection 4 mg  4 mg Intravenous Q6H PRN Lovell Sheehan, MD      . propranolol (INDERAL) tablet 10 mg  10 mg Oral BID PRN Lovell Sheehan, MD      . QUEtiapine (SEROQUEL) tablet 12.5 mg  12.5 mg Oral Daily PRN Lovell Sheehan, MD   12.5 mg at 06/17/19 2149  . tamsulosin (FLOMAX) capsule 0.4 mg  0.4 mg Oral Daily Lovell Sheehan, MD   0.4 mg at 06/19/19 0802  . thiamine (VITAMIN B-1) tablet 100 mg  100 mg Oral Daily Lovell Sheehan, MD   100 mg at 06/19/19 0801  . traZODone (DESYREL) tablet 25 mg  25 mg Oral QHS PRN Lovell Sheehan, MD   25 mg at 06/17/19 2148  . Valbenazine Tosylate CAPS 80 mg  80 mg Oral QHS Lovell Sheehan, MD         Discharge Medications: Please see discharge summary for a list of discharge medications.  Relevant Imaging Results:  Relevant Lab Results:   Additional Information SSN: [id number]  Verna Hamon, Veronia Beets, LCSW

## 2019-06-19 NOTE — Progress Notes (Signed)
I notified MD Anselm Jungling about patient retaining urine. >592. Waiting on response.

## 2019-06-20 ENCOUNTER — Ambulatory Visit: Payer: Medicare Other | Admitting: Psychiatry

## 2019-06-20 MED ORDER — SENNOSIDES-DOCUSATE SODIUM 8.6-50 MG PO TABS
1.0000 | ORAL_TABLET | Freq: Two times a day (BID) | ORAL | Status: DC
  Administered 2019-06-20 (×2): 1 via ORAL
  Filled 2019-06-20 (×2): qty 1

## 2019-06-20 MED ORDER — POLYETHYLENE GLYCOL 3350 17 G PO PACK
17.0000 g | PACK | Freq: Every day | ORAL | Status: DC
  Administered 2019-06-20: 17 g via ORAL
  Filled 2019-06-20: qty 1

## 2019-06-20 NOTE — Addendum Note (Signed)
Addendum  created 06/20/19 1045 by Doreen Salvage, CRNA   Charge Capture section accepted

## 2019-06-20 NOTE — Progress Notes (Signed)
[place] [facility] [place] NAME: Andres [name]    MR#:  [id number]  DATE OF BIRTH:  Sep 15, 1966  SUBJECTIVE:  Patient without complaint, father at bedside, no events overnight, orthopedic surgery had done hip surgery. No complains.  REVIEW OF SYSTEMS:   Due to mental retardation- he can not give much ROS reliably.  ROS  DRUG ALLERGIES:   Allergies  Allergen Reactions  . Prednisone Other (See Comments)    Did not like the way it made him feel   . Lamotrigine Rash    VITALS:  Blood pressure (!) 151/86, pulse 100, temperature 97.7 F (36.5 C), temperature source Oral, resp. rate 20, height 5\' 6"  (1.676 m), weight 72.6 kg, SpO2 100 %.  PHYSICAL EXAMINATION:  GENERAL:  53 y.o.-year-old patient lying in the bed with no acute distress.  EYES: Pupils equal, round, reactive to light and accommodation. No scleral icterus. Extraocular muscles intact.  HEENT: Head atraumatic, normocephalic. Oropharynx and nasopharynx clear.  NECK:  Supple, no jugular venous distention. No thyroid enlargement, no tenderness.  LUNGS: Normal breath sounds bilaterally, no wheezing, rales,rhonchi or crepitation. No use of accessory muscles of respiration.  CARDIOVASCULAR: S1, S2 normal. No murmurs, rubs, or gallops.  ABDOMEN: Soft, nontender, nondistended. Bowel sounds present. No organomegaly or mass.  EXTREMITIES: No pedal edema, cyanosis, or clubbing.  NEUROLOGIC: Cranial nerves II through XII are intact. Muscle strength 4/5 in all extremities. Sensation intact. Gait not checked.  PSYCHIATRIC: The patient is alert and oriented x 1.  SKIN: No obvious rash, lesion, or ulcer.   Physical Exam LABORATORY PANEL:   CBC Recent Labs  Lab 06/19/19 0553  WBC 5.4  HGB 11.4*  HCT 34.0*  PLT 120*   ------------------------------------------------------------------------------------------------------------------  Chemistries  Recent Labs  Lab 06/17/19 1338  06/19/19 0553   NA 145  --  145  K 3.8  --  3.7  CL 113*  --  111  CO2 20*  --  22  GLUCOSE 146*  --  111*  BUN 21*  --  15  CREATININE 1.04   < > 0.88  CALCIUM 8.8*  --  8.1*  AST 31  --   --   ALT 27  --   --   ALKPHOS 56  --   --   BILITOT 0.5  --   --    < > = values in this interval not displayed.   ------------------------------------------------------------------------------------------------------------------  Cardiac Enzymes No results for input(s): TROPONINI in the last 168 hours. ------------------------------------------------------------------------------------------------------------------  RADIOLOGY:  Dg Hip Operative Unilat W Or W/o Pelvis Right  Result Date: 06/18/2019 CLINICAL DATA:  Elective surgery. Right femur fracture fixation. EXAM: OPERATIVE RIGHT HIP (WITH PELVIS IF PERFORMED) 2 VIEWS TECHNIQUE: Fluoroscopic spot image(s) were submitted for interpretation post-operatively. COMPARISON:  Preoperative radiographs yesterday. FINDINGS: Intramedullary rod with trans trochanteric screw fixation of intertrochanteric femur fracture. Fracture is in improved alignment compared to preoperative imaging. Total fluoroscopy time 38 seconds. Total dose 5.63 mGy. IMPRESSION: Procedural fluoroscopy after ORIF right intertrochanteric femur fracture. Electronically Signed   By: Keith Rake M.D.   On: 06/18/2019 20:55    ASSESSMENT AND PLAN:  53 y.o. male with pertinent past medical history of mental retardation, thrombocytopenia, allergic state, depression, chronic bilateral lower extremity edema, hypothyroidism, history of fracture and plate placement in left leg, drug induced tremors presenting to the ED with right hip pain.  * Acute right hip fracture secondary to unwitnessed fall S/p IM nail sx by  ortho, continue to pain protocol  *Acute fall felt slightly dizzy and legs gave way, did not hit head PT/OT consult - need SNF  *Chronic thrombocytopenia Stable No signs of active  bleeding  *Chronic developmental disability with mood disorder Stable Continue home psychotropic regiment Follows with Dr. Elna BreslowEappen  * Constipation- give stool softner.  DVT prophylaxis with SCDs, lovenox.  GI prophylaxis with PPI daily Disposition pending clinical course  All the records are reviewed and case discussed with Care Management/Social Workerr. Management plans discussed with the patient, family and they are in agreement.  CODE STATUS: full  TOTAL TIME TAKING CARE OF THIS PATIENT: 35 minutes.    POSSIBLE D/C IN 2-4 DAYS, DEPENDING ON CLINICAL CONDITION.   Altamese DillingVaibhavkumar Aviannah Castoro M.D on 06/20/2019   Between 7am to 6pm - Pager - [phone]  After 6pm go to [email or web address] - password [email or web address]  [facility]  Office  [phone]  CC: Primary care physician; Smitty CordsKaramalegos, Alexander J, DO  Note: This dictation was prepared with Dragon dictation along with smaller phrase technology. Any transcriptional errors that result from this process are unintentional.

## 2019-06-20 NOTE — Progress Notes (Signed)
At approximately 00225, pt reported urge to void but experiencing difficulty voiding, Bladder scan for 512, MD. Mansy notified, I&O per order for 500.

## 2019-06-20 NOTE — Care Management Important Message (Signed)
Important Message  Patient Details  Name: Andres [name] MRN: [id number] Date of Birth: [birth year]   Medicare Important Message Given:  Yes     [name] 06/20/2019, 3:25 PM

## 2019-06-20 NOTE — TOC Progression Note (Addendum)
Transition of Care [facility] - Progression Note    Patient Details  Name: Andres [name] MRN: [id number] Date of Birth: May 14, 1966  Transition of Care [facility]) CM/SW Contact  Rayford Williamsen, Lenice Llamas Phone Number: [phone]  06/20/2019, 9:16 AM  Clinical Narrative: Patient's father Andres [name] requested [facility]. Per Otila Kluver [facility] liaison she can accept patient and will start [facility] SNF authorization today. PASARR is still pending. Patient and his father Andres [name] are aware of above.     Expected Discharge Plan: [place] Barriers to Discharge: Continued Medical Work up  Expected Discharge Plan and Services Expected Discharge Plan: [place] In-house Referral: Clinical Social Work Discharge Planning Services: CM Consult   Living arrangements for the past 2 months: Single Family Home                                       Social Determinants of Health (SDOH) Interventions    Readmission Risk Interventions No flowsheet data found.

## 2019-06-20 NOTE — Progress Notes (Signed)
  Subjective:  Patient reports pain as moderate.    Objective:   VITALS:   Vitals:   06/19/19 1200 06/19/19 1629 06/19/19 2359 06/20/19 0749  BP: 136/82 128/76 (!) 143/88 (!) 147/83  Pulse: 83 73 84 81  Resp: 18  19 20   Temp: 97.7 F (36.5 C) 98.1 F (36.7 C) 97.9 F (36.6 C) 97.6 F (36.4 C)  TempSrc:  Oral  Oral  SpO2: 98% 99% 99% 99%  Weight:      Height:        PHYSICAL EXAM:  Sensation intact distally Dorsiflexion/Plantar flexion intact Incision: dressing C/D/I No cellulitis present Compartment soft  LABS  No results found for this or any previous visit (from the past 24 hour(s)).  Dg Hip Operative Unilat W Or W/o Pelvis Right  Result Date: 06/18/2019 CLINICAL DATA:  Elective surgery. Right femur fracture fixation. EXAM: OPERATIVE RIGHT HIP (WITH PELVIS IF PERFORMED) 2 VIEWS TECHNIQUE: Fluoroscopic spot image(s) were submitted for interpretation post-operatively. COMPARISON:  Preoperative radiographs yesterday. FINDINGS: Intramedullary rod with trans trochanteric screw fixation of intertrochanteric femur fracture. Fracture is in improved alignment compared to preoperative imaging. Total fluoroscopy time 38 seconds. Total dose 5.63 mGy. IMPRESSION: Procedural fluoroscopy after ORIF right intertrochanteric femur fracture. Electronically Signed   By: Keith Rake M.D.   On: 06/18/2019 20:55    Assessment/Plan: 2 Days Post-Op   Active Problems:   Closed comminuted fracture of right hip ([place])   Up with therapy  Discharge Planning RTC 2 weeks for staple removal    Lovell Sheehan , MD 06/20/2019, 10:19 AM

## 2019-06-20 NOTE — Progress Notes (Signed)
Physical Therapy Treatment Patient Details Name: Andres [name] MRN: [id number] DOB: 10-26-1966 Today's Date: 06/20/2019    History of Present Illness 53 y/o male here after fall with R hip fx, IM nailing 06/18/19.    PT Comments    Patient was able to better participate in all exercises this date, though notes increasing R hip pain with activity. He is still quite limited with transfers due to pain and cognition/insight. He attempted standing for multiple attempts with therapist, however noted to have bowel movement and noted to have significant reduction in effort between 1st and 3rd attempts with transfers likely from BM. Assisted RN staff with clean up, patient was able to roll bilaterally well with minimal increase in pain. He is still more appropriate for SNF at this time to increase his functional mobility.    Follow Up Recommendations  SNF     Equipment Recommendations  None recommended by PT    Recommendations for Other Services       Precautions / Restrictions Precautions Precautions: Fall Restrictions Weight Bearing Restrictions: Yes RLE Weight Bearing: Weight bearing as tolerated Other Position/Activity Restrictions: R hip ORIF    Mobility  Bed Mobility Overal bed mobility: Needs Assistance Bed Mobility: Sit to Supine;Supine to Sit     Supine to sit: Max assist Sit to supine: Max assist;+2 for physical assistance   General bed mobility comments: Patient requires assistance for torso management and LE maneuvering around the bed.  Transfers Overall transfer level: Needs assistance Equipment used: Rolling walker (2 wheeled) Transfers: Sit to/from Stand Sit to Stand: Max assist         General transfer comment: Multiple attempts to transfer, patient noted to have stool after 3rd attempt when his efforts decreased, he became less verbally responsive in consistency and was not to have decreased effort thus dc'd for clean up.  Ambulation/Gait                 Stairs             Wheelchair Mobility    Modified Rankin (Stroke Patients Only)       Balance Overall balance assessment: Needs assistance;History of Falls Sitting-balance support: Bilateral upper extremity supported;Feet supported Sitting balance-Leahy Scale: Good Sitting balance - Comments: Minimal assistance if any required to maintain upright posture from therapist.   Standing balance support: Bilateral upper extremity supported Standing balance-Leahy Scale: Zero Standing balance comment: Unable to get to standing in this session.                            Cognition Arousal/Alertness: Awake/alert Behavior During Therapy: Flat affect Overall Cognitive Status: History of cognitive impairments - at baseline                                 General Comments: Patient is able to provide simple 1-2 word answers throughout session and provide feedback on his needs. At the end of the session patient became less frequently responsive.      Exercises General Exercises - Lower Extremity Ankle Circles/Pumps: AROM;Both;15 reps Long Arc Quad: AROM;AAROM;10 reps;Both;Seated Heel Slides: AAROM;Both;15 reps Hip ABduction/ADduction: AAROM;Both;15 reps Straight Leg Raises: AAROM;Both;20 reps Other Exercises Other Exercises: Assisted RN staff with rolling mod-max A x 1 bil to assist with clean up.    General Comments        Pertinent Vitals/Pain Pain Assessment: Faces Faces  Pain Scale: Hurts little more Pain Descriptors / Indicators: Aching;Operative site guarding Pain Intervention(s): Limited activity within patient's tolerance;Monitored during session    Home Living                      Prior Function            PT Goals (current goals can now be found in the care plan section) Acute Rehab PT Goals Patient Stated Goal: To improve gait mechanics and distance PT Goal Formulation: With patient/family Time For Goal Achievement:  07/03/19 Potential to Achieve Goals: Fair Progress towards PT goals: Progressing toward goals    Frequency    BID      PT Plan Current plan remains appropriate    Co-evaluation              AM-PAC PT "6 Clicks" Mobility   Outcome Measure  Help needed turning from your back to your side while in a flat bed without using bedrails?: A Lot Help needed moving from lying on your back to sitting on the side of a flat bed without using bedrails?: Total Help needed moving to and from a bed to a chair (including a wheelchair)?: A Lot Help needed standing up from a chair using your arms (e.g., wheelchair or bedside chair)?: A Lot Help needed to walk in hospital room?: Total Help needed climbing 3-5 steps with a railing? : Total 6 Click Score: 9    End of Session Equipment Utilized During Treatment: Gait belt Activity Tolerance: Patient tolerated treatment well Patient left: with call bell/phone within reach;in bed;with bed alarm set Nurse Communication: Mobility status PT Visit Diagnosis: Muscle weakness (generalized) (M62.81);Pain;Difficulty in walking, not elsewhere classified (R26.2) Pain - Right/Left: Right Pain - part of body: Hip     Time: [id number] PT Time Calculation (min) (ACUTE ONLY): 28 min  Charges:  $Therapeutic Exercise: 8-22 mins $Therapeutic Activity: 8-22 mins            Alva GarnetPatrick  PT, DPT, CSCS             06/20/2019, 5:05 PM

## 2019-06-20 NOTE — Progress Notes (Signed)
Physical Therapy Treatment Patient Details Name: Andres [name] MRN: [id number] DOB: 09/25/1966 Today's Date: 06/20/2019    History of Present Illness 53 y/o male here after fall with R hip fx, IM nailing 06/18/19.    PT Comments    Patient is able to be more actively engaged in there-ex this session with less support required by PT to complete each exercise. He is able to stand and balance (in sitting and standing) with less support this date than previous sessions. He is still struggling to advance LLE due to hesitancy to weightbear through RLE. He is generally able to bear more weight through RLE this date, though would still benefit from SNF at discharge to improve functional mobility.   Follow Up Recommendations  SNF     Equipment Recommendations  None recommended by PT    Recommendations for Other Services       Precautions / Restrictions Precautions Precautions: Fall Restrictions Weight Bearing Restrictions: Yes RLE Weight Bearing: Weight bearing as tolerated Other Position/Activity Restrictions: R hip ORIF    Mobility  Bed Mobility Overal bed mobility: Needs Assistance Bed Mobility: Sit to Supine     Supine to sit: Max assist     General bed mobility comments: Patient is apprehensive with any movement of the RLE, requires max A to bring to EOB and perform supine to sit transfer for trunkal stability.  Transfers Overall transfer level: Needs assistance Equipment used: Rolling walker (2 wheeled) Transfers: Sit to/from Stand Sit to Stand: Mod assist;+2 safety/equipment         General transfer comment: Patient initially with severe posterior lean on first attempt, with cuing to "tuck in pelvis" he is able to stand more upright on second attempt.  Ambulation/Gait Ambulation/Gait assistance: Mod assist;Max assist;+2 physical assistance Gait Distance (Feet): 3 Feet Assistive device: Rolling walker (2 wheeled) Gait Pattern/deviations: Step-to pattern   Gait  velocity interpretation: <1.31 ft/sec, indicative of household ambulator General Gait Details: Patient is able to slide LLE, better control of RLE with cuing and physical assistance. Requires support throughout transfer to maintain balance and not lean posteriorly.   Stairs             Wheelchair Mobility    Modified Rankin (Stroke Patients Only)       Balance Overall balance assessment: History of Falls Sitting-balance support: Bilateral upper extremity supported;Feet supported Sitting balance-Leahy Scale: Fair Sitting balance - Comments: Patient is able to sit independently, but has posterior lean and requires all 4 extremities for balance.     Standing balance-Leahy Scale: Poor Standing balance comment: Heavy posterior lean and reluctance to WB through RLE in LLE advancement.                            Cognition Arousal/Alertness: Awake/alert Behavior During Therapy: Anxious;Restless Overall Cognitive Status: History of cognitive impairments - at baseline                                 General Comments: Patient is able to provide simple 1-2 word answers throughout session and provide feedback on his needs.      Exercises General Exercises - Lower Extremity Ankle Circles/Pumps: AROM;Both;15 reps Heel Slides: AAROM;Both;15 reps Hip ABduction/ADduction: AAROM;Both;15 reps Straight Leg Raises: AAROM;Both;20 reps    General Comments        Pertinent Vitals/Pain Pain Assessment: Faces Faces Pain Scale: Hurts little more  Pain Location: indicates minimal pain at rest and moderate increase in pain with R hip mvt Pain Descriptors / Indicators: Aching;Operative site guarding Pain Intervention(s): Limited activity within patient's tolerance;Monitored during session;Repositioned    Home Living                      Prior Function            PT Goals (current goals can now be found in the care plan section) Acute Rehab PT  Goals Patient Stated Goal: To improve gait mechanics and distance PT Goal Formulation: With patient/family Time For Goal Achievement: 07/03/19 Potential to Achieve Goals: Fair Progress towards PT goals: Progressing toward goals    Frequency    BID      PT Plan Current plan remains appropriate    Co-evaluation              AM-PAC PT "6 Clicks" Mobility   Outcome Measure  Help needed turning from your back to your side while in a flat bed without using bedrails?: A Lot Help needed moving from lying on your back to sitting on the side of a flat bed without using bedrails?: Total Help needed moving to and from a bed to a chair (including a wheelchair)?: A Lot Help needed standing up from a chair using your arms (e.g., wheelchair or bedside chair)?: A Lot Help needed to walk in hospital room?: Total Help needed climbing 3-5 steps with a railing? : Total 6 Click Score: 9    End of Session Equipment Utilized During Treatment: Gait belt Activity Tolerance: Patient tolerated treatment well Patient left: with call bell/phone within reach;in chair;with chair alarm set;with family/visitor present Nurse Communication: Mobility status PT Visit Diagnosis: Muscle weakness (generalized) (M62.81);Pain;Difficulty in walking, not elsewhere classified (R26.2) Pain - Right/Left: Right Pain - part of body: Hip     Time: [id number] PT Time Calculation (min) (ACUTE ONLY): 23 min  Charges:  $Gait Training: 8-22 mins $Therapeutic Exercise: 8-22 mins                     Alva GarnetPatrick McNamara PT, DPT, CSCS   06/20/2019, 10:41 AM

## 2019-06-20 NOTE — Progress Notes (Signed)
[employer] [facility] [place] [facility]   PATIENT NAME: Andres [name]    MR#:  [id number]  DATE OF BIRTH:  02/02/1966  SUBJECTIVE:  Patient without complaint, father at bedside, no events overnight, orthopedic surgery had done hip surgery. No complains.  REVIEW OF SYSTEMS:  Due to mental retardation- he can not give much ROS reliably.   ROS  DRUG ALLERGIES:   Allergies  Allergen Reactions  . Prednisone Other (See Comments)    Did not like the way it made him feel   . Lamotrigine Rash    VITALS:  Blood pressure (!) 151/86, pulse 100, temperature 97.7 F (36.5 C), temperature source Oral, resp. rate 20, height 5\' 6"  (1.676 m), weight 72.6 kg, SpO2 100 %.  PHYSICAL EXAMINATION:  GENERAL:  53 y.o.-year-old patient lying in the bed with no acute distress.  EYES: Pupils equal, round, reactive to light and accommodation. No scleral icterus. Extraocular muscles intact.  HEENT: Head atraumatic, normocephalic. Oropharynx and nasopharynx clear.  NECK:  Supple, no jugular venous distention. No thyroid enlargement, no tenderness.  LUNGS: Normal breath sounds bilaterally, no wheezing, rales,rhonchi or crepitation. No use of accessory muscles of respiration.  CARDIOVASCULAR: S1, S2 normal. No murmurs, rubs, or gallops.  ABDOMEN: Soft, nontender, nondistended. Bowel sounds present. No organomegaly or mass.  EXTREMITIES: No pedal edema, cyanosis, or clubbing.  NEUROLOGIC: Cranial nerves II through XII are intact. Muscle strength 4/5 in all extremities. Sensation intact. Gait not checked.  PSYCHIATRIC: The patient is alert and oriented x 1.  SKIN: No obvious rash, lesion, or ulcer.   Physical Exam LABORATORY PANEL:   CBC Recent Labs  Lab 06/19/19 0553  WBC 5.4  HGB 11.4*  HCT 34.0*  PLT 120*   ------------------------------------------------------------------------------------------------------------------  Chemistries  Recent Labs  Lab 06/17/19 1338  06/19/19 0553   NA 145  --  145  K 3.8  --  3.7  CL 113*  --  111  CO2 20*  --  22  GLUCOSE 146*  --  111*  BUN 21*  --  15  CREATININE 1.04   < > 0.88  CALCIUM 8.8*  --  8.1*  AST 31  --   --   ALT 27  --   --   ALKPHOS 56  --   --   BILITOT 0.5  --   --    < > = values in this interval not displayed.   ------------------------------------------------------------------------------------------------------------------  Cardiac Enzymes No results for input(s): TROPONINI in the last 168 hours. ------------------------------------------------------------------------------------------------------------------  RADIOLOGY:  Dg Hip Operative Unilat W Or W/o Pelvis Right  Result Date: 06/18/2019 CLINICAL DATA:  Elective surgery. Right femur fracture fixation. EXAM: OPERATIVE RIGHT HIP (WITH PELVIS IF PERFORMED) 2 VIEWS TECHNIQUE: Fluoroscopic spot image(s) were submitted for interpretation post-operatively. COMPARISON:  Preoperative radiographs yesterday. FINDINGS: Intramedullary rod with trans trochanteric screw fixation of intertrochanteric femur fracture. Fracture is in improved alignment compared to preoperative imaging. Total fluoroscopy time 38 seconds. Total dose 5.63 mGy. IMPRESSION: Procedural fluoroscopy after ORIF right intertrochanteric femur fracture. Electronically Signed   By: Narda RutherfordMelanie  Sanford M.D.   On: 06/18/2019 20:55    ASSESSMENT AND PLAN:  53 y.o. male with pertinent past medical history of mental retardation, thrombocytopenia, allergic state, depression, chronic bilateral lower extremity edema, hypothyroidism, history of fracture and plate placement in left leg, drug induced tremors presenting to the ED with right hip pain.  * Acute right hip fracture secondary to unwitnessed fall S/p IM nail sx by  ortho, continue to pain protocol  *Acute fall felt slightly dizzy and legs gave way, did not hit head PT/OT consult - need SNF  *Chronic thrombocytopenia Stable No signs of active  bleeding  *Chronic developmental disability with mood disorder Stable Continue home psychotropic regiment Follows with Dr. Shea Evans  DVT prophylaxis with SCDs GI prophylaxis with PPI daily Disposition pending clinical course  All the records are reviewed and case discussed with Care Management/Social Workerr. Management plans discussed with the patient, family and they are in agreement.  CODE STATUS: full  TOTAL TIME TAKING CARE OF THIS PATIENT: 35 minutes.    POSSIBLE D/C IN 2-4 DAYS, DEPENDING ON CLINICAL CONDITION.   Vaughan Basta M.D on 06/20/2019   Between 7am to 6pm - Pager - [phone]  After 6pm go to [email or web address] - password [email or web address] [facility]  Office  [phone]  CC: Primary care physician; Olin Hauser, DO  Note: This dictation was prepared with Dragon dictation along with smaller phrase technology. Any transcriptional errors that result from this process are unintentional.

## 2019-06-21 LAB — CBC
HCT: 30.6 % — ABNORMAL LOW (ref 39.0–52.0)
Hemoglobin: 10.6 g/dL — ABNORMAL LOW (ref 13.0–17.0)
MCH: 30.3 pg (ref 26.0–34.0)
MCHC: 34.6 g/dL (ref 30.0–36.0)
MCV: 87.4 fL (ref 80.0–100.0)
Platelets: 132 10*3/uL — ABNORMAL LOW (ref 150–400)
RBC: 3.5 MIL/uL — ABNORMAL LOW (ref 4.22–5.81)
RDW: 12.7 % (ref 11.5–15.5)
WBC: 4.8 10*3/uL (ref 4.0–10.5)
nRBC: 0 % (ref 0.0–0.2)

## 2019-06-21 MED ORDER — SENNOSIDES-DOCUSATE SODIUM 8.6-50 MG PO TABS: 1.0000 | ORAL_TABLET | Freq: Two times a day (BID) | ORAL | 0 refills | Status: DC

## 2019-06-21 MED ORDER — TAMSULOSIN HCL 0.4 MG PO CAPS: 0.4000 mg | ORAL_CAPSULE | Freq: Every day | ORAL | 0 refills | Status: DC

## 2019-06-21 MED ORDER — ONDANSETRON HCL 4 MG PO TABS: 4.0000 mg | ORAL_TABLET | Freq: Four times a day (QID) | ORAL | 0 refills | Status: DC | PRN

## 2019-06-21 MED ORDER — HYDROCODONE-ACETAMINOPHEN 5-325 MG PO TABS: 1.0000 | ORAL_TABLET | Freq: Four times a day (QID) | ORAL | 0 refills | Status: DC | PRN

## 2019-06-21 MED ORDER — ENOXAPARIN SODIUM 40 MG/0.4ML ~~LOC~~ SOLN: 40.0000 mg | SUBCUTANEOUS | 0 refills | Status: DC

## 2019-06-21 NOTE — TOC Transition Note (Signed)
Transition of Care [facility]) - CM/SW Discharge Note   Patient Details  Name: Andres [name] MRN: [id number] Date of Birth: 06-23-1966  Transition of Care [facility]) CM/SW Contact:  Fareeda Downard, Lenice Llamas Phone Number: [phone]  06/21/2019, 3:20 PM   Clinical Narrative: Patient is medically stable for D/C to [facility] today. Per [facility] admissions coordinator [facility] SN[facility] authorization has been received and patient can come today to room 803. Tammy will accept patient to [facility] with a pending PASARR. RN will call report and arrange EMS for transport. Clinical Education officer, museum (CSW) sent D/C orders to [facility] via [facility]. Patient is aware of above. CSW contacted patient's father Andres [name] and made him aware of above. Please reconsult if future social work needs arise. CSW signing off.     Final next level of care: Skilled Nursing Facility Barriers to Discharge: Barriers Resolved   Patient Goals and CMS Choice Patient states their goals for this hospitalization and ongoing recovery are:: Pain control CMS Medicare.gov Compare Post Acute Care list provided to:: Patient Represenative (must comment)(Patient's father Andres [name]) Choice offered to / list presented to : Parent  Discharge Placement PASRR number recieved: (PAS[facility] is pending, [facility] will accept patient with pending [facility].)            Patient chooses bed at: [facility] Patient to be transferred to facility by: [facility] Name of family member notified: Patient's father Andres [name] is aware of D/C today. Patient and family notified of of transfer: 06/21/19  Discharge Plan and Services In-house Referral: Clinical Social Work Discharge Planning Services: CM Consult                                 Social Determinants of Health (SDOH) Interventions     Readmission Risk Interventions No flowsheet data found.

## 2019-06-21 NOTE — Progress Notes (Signed)
Physical Therapy Treatment Patient Details Name: Andres [name] MRN: [id number] DOB: 06/26/1966 Today's Date: 06/21/2019    History of Present Illness 53 y/o male here after fall with R hip fx, IM nailing 06/18/19.    PT Comments    Pt continues to make slow but steady gains as he was actually able to take a few steps this session.  Pt continues to struggle to fully engage with PT cuing, etc but was pleasant and willing to try.  He showed increased AROM or ability to do more with AAROM during exercises on R but overall is making functional improvements though still clearly unable to return home and continues to need STR on discharge.   Follow Up Recommendations  SNF     Equipment Recommendations  None recommended by PT    Recommendations for Other Services       Precautions / Restrictions Restrictions RLE Weight Bearing: Weight bearing as tolerated    Mobility  Bed Mobility Overal bed mobility: Needs Assistance Bed Mobility: Supine to Sit     Supine to sit: Mod assist     General bed mobility comments: Pt able to show considerably more effort with getting to EOB this date, still needs heavy assist to actually transition to upright sitting  Transfers Overall transfer level: Needs assistance Equipment used: Rolling walker (2 wheeled) Transfers: Sit to/from Stand Sit to Stand: Max assist         General transfer comment: On first standing effort he had excessive extensor response throwing head and trunk backward and needing heavy assist just to memain standing.  As he was unable to get feet underneath him we sat back down. Cues to keep weight forward and "look at toes" helped greatly on second standing effort, still needing assist to transition to upright.  Pt struggled to sequencing sitting backdown to recliner, showed poor ability to follow instructions and essentially crashed back down to recliner despite a lot of cuing.  Ambulation/Gait Ambulation/Gait assistance: Mod  assist;Max assist Gait Distance (Feet): 5 Feet Assistive device: Rolling walker (2 wheeled)       General Gait Details: Pt initially very resistant to trying to step/put weight through R LE but with encouragement from PT and family he was able to take very guarded and cues steps with heavy reliance on walker and variable assist from PT (chair following)   Stairs             Wheelchair Mobility    Modified Rankin (Stroke Patients Only)       Balance Overall balance assessment: Needs assistance;History of Falls Sitting-balance support: Bilateral upper extremity supported;Feet supported Sitting balance-Leahy Scale: Good     Standing balance support: Bilateral upper extremity supported Standing balance-Leahy Scale: Poor Standing balance comment: highly reliant on walker, once able to keep weight forward and remain upright he at times needed only CGA, though generally still min assist                            Cognition Arousal/Alertness: Awake/alert Behavior During Therapy: Anxious Overall Cognitive Status: History of cognitive impairments - at baseline                                        Exercises General Exercises - Lower Extremity Ankle Circles/Pumps: AROM;Both;15 reps Quad Sets: Strengthening;10 reps Short Arc Quad: 10 reps;AAROM;AROM(pt able  to do a few reps AROM on R once assisted on first 7) Heel Slides: AAROM;10 reps(Pt did not tolerate hip flexion past ~60 degrees PROM on R) Hip ABduction/ADduction: AROM;15 reps;AAROM    General Comments        Pertinent Vitals/Pain Pain Assessment: Faces Faces Pain Scale: Hurts little more(much less sensitivity with pain than last time with this PT) Pain Location: indicates minimal pain at rest and moderate increase in pain with R hip mvt    Home Living                      Prior Function            PT Goals (current goals can now be found in the care plan section)  Progress towards PT goals: Progressing toward goals    Frequency    BID      PT Plan Current plan remains appropriate    Co-evaluation              AM-PAC PT "6 Clicks" Mobility   Outcome Measure  Help needed turning from your back to your side while in a flat bed without using bedrails?: A Lot Help needed moving from lying on your back to sitting on the side of a flat bed without using bedrails?: Total Help needed moving to and from a bed to a chair (including a wheelchair)?: A Lot Help needed standing up from a chair using your arms (e.g., wheelchair or bedside chair)?: A Lot Help needed to walk in hospital room?: Total Help needed climbing 3-5 steps with a railing? : Total 6 Click Score: 9    End of Session Equipment Utilized During Treatment: Gait belt Activity Tolerance: Patient tolerated treatment well;Patient limited by pain Patient left: with chair alarm set;with call bell/phone within reach;with family/visitor present Nurse Communication: Mobility status PT Visit Diagnosis: Muscle weakness (generalized) (M62.81);Pain;Difficulty in walking, not elsewhere classified (R26.2) Pain - Right/Left: Right Pain - part of body: Hip     Time: 1478-2956 PT Time Calculation (min) (ACUTE ONLY): 26 min  Charges:  $Gait Training: 8-22 mins $Therapeutic Exercise: 8-22 mins                     Kreg Shropshire, DPT 06/21/2019, 1:20 PM

## 2019-06-21 NOTE — Discharge Summary (Signed)
[facility] - [place] [facility]   PATIENT NAME: Andres [name]    MR#:  [id number]  DATE OF BIRTH:  05/02/1966  DATE OF ADMISSION:  06/17/2019 ADMITTING PHYSICIAN: Alford Highlandichard Wieting, MD  DATE OF DISCHARGE: 06/21/2019   PRIMARY CARE PHYSICIAN: Smitty CordsKaramalegos, Alexander J, DO    ADMISSION DIAGNOSIS:  Pre-op chest exam [Z01.811] Closed fracture of right hip, initial encounter (HCC) [S72.001A]  DISCHARGE DIAGNOSIS:  Active Problems:   Closed comminuted fracture of right hip (HCC)   SECONDARY DIAGNOSIS:   Past Medical History:  Diagnosis Date  . Allergy     HOSPITAL COURSE:   53 y.o.malewith pertinent past medical history ofmental retardation, thrombocytopenia, allergic state, depression, chronic bilateral lower extremity edema, hypothyroidism, history of fracture and plate placement in left leg, drug induced tremors presenting to the ED with right hip pain.  *Acute right hip fracture secondary to unwitnessed fall S/p IM nail sx by ortho, continue to pain protocol, DVT prophylaxis.  *Acute fall felt slightly dizzy and legs gave way, did not hit head PT/OT consult - need SNF  *Chronic thrombocytopenia Stable No signs of active bleeding  *Chronic developmental disability with mood disorder Stable Continue home psychotropic regiment Follows with Dr. Elna BreslowEappen  * Constipation- give stool softner. Resolved.  DVT prophylaxis with SCDs, lovenox.  GI prophylaxis with PPI daily Disposition - to SNF for rehab.  DISCHARGE CONDITIONS:   Stable.  CONSULTS OBTAINED:    DRUG ALLERGIES:   Allergies  Allergen Reactions  . Prednisone Other (See Comments)    Did not like the way it made him feel   . Lamotrigine Rash    DISCHARGE MEDICATIONS:   Allergies as of 06/21/2019      Reactions   Prednisone Other (See Comments)   Did not like the way it made him feel    Lamotrigine Rash      Medication List    TAKE these medications    allopurinol 300 MG tablet Commonly known as: ZYLOPRIM Take 1 tablet (300 mg total) by mouth daily.   cholecalciferol 1000 units tablet Commonly known as: VITAMIN D Take 1,000 Units by mouth daily.   citalopram 10 MG tablet Commonly known as: CELEXA Take 1 tablet (10 mg total) by mouth daily.   enoxaparin 40 MG/0.4ML injection Commonly known as: LOVENOX Inject 0.4 mLs (40 mg total) into the skin daily for 20 days. Start taking on: June 22, 2019   fexofenadine 180 MG tablet Commonly known as: ALLEGRA Take 180 mg by mouth.   HYDROcodone-acetaminophen 5-325 MG tablet Commonly known as: NORCO/VICODIN Take 1-2 tablets by mouth every 6 (six) hours as needed for moderate pain or severe pain.   levothyroxine 25 MCG tablet Commonly known as: SYNTHROID TAKE 1 TABLET BY MOUTH DAILY BEFORE BREAKFAST. ON EMPTY STOMACH, NOT TO TAKE WITH OTHER MEDICINES   ondansetron 4 MG tablet Commonly known as: ZOFRAN Take 1 tablet (4 mg total) by mouth every 6 (six) hours as needed for nausea.   propranolol 10 MG tablet Commonly known as: INDERAL Take 1 tablet (10 mg total) by mouth 2 (two) times daily as needed. For severe anxiety/agitation   QUEtiapine 25 MG tablet Commonly known as: SEROquel Take 0.5 tablets (12.5 mg total) by mouth daily as needed. For severe anxiety/agitation What changed:   additional instructions  Another medication with the same name was removed. Continue taking this medication, and follow the directions you see here.   senna-docusate 8.6-50 MG tablet Commonly known as: Senokot-S Take  1 tablet by mouth 2 (two) times daily.   tamsulosin 0.4 MG Caps capsule Commonly known as: FLOMAX Take 1 capsule (0.4 mg total) by mouth daily. Start taking on: June 22, 2019   thiamine 100 MG tablet Commonly known as: VITAMIN B-1 Take 100 mg by mouth daily.   traZODone 50 MG tablet Commonly known as: DESYREL Take 0.5 tablets (25 mg total) by mouth at bedtime as needed for  sleep.   Valbenazine Tosylate 80 MG Caps Commonly known as: Ingrezza Take 80 mg by mouth at bedtime.        DISCHARGE INSTRUCTIONS:    Follow with ortho clinic in 1-2 weeks.  If you experience worsening of your admission symptoms, develop shortness of breath, life threatening emergency, suicidal or homicidal thoughts you must seek medical attention immediately by calling 911 or calling your MD immediately  if symptoms less severe.  You Must read complete instructions/literature along with all the possible adverse reactions/side effects for all the Medicines you take and that have been prescribed to you. Take any new Medicines after you have completely understood and accept all the possible adverse reactions/side effects.   Please note  You were cared for by a hospitalist during your hospital stay. If you have any questions about your discharge medications or the care you received while you were in the hospital after you are discharged, you can call the unit and asked to speak with the hospitalist on call if the hospitalist that took care of you is not available. Once you are discharged, your primary care physician will handle any further medical issues. Please note that NO REFILLS for any discharge medications will be authorized once you are discharged, as it is imperative that you return to your primary care physician (or establish a relationship with a primary care physician if you do not have one) for your aftercare needs so that they can reassess your need for medications and monitor your lab values.    Today   CHIEF COMPLAINT:   Chief Complaint  Patient presents with  . Fall    HISTORY OF PRESENT ILLNESS:  Andres [name]  is a 53 y.o. male with a known history of mental retardation, thrombocytopenia, allergic state, depression, chronic bilateral lower extremity edema, hypothyroidism, history of fracture and plate placement in left leg, drug induced tremors presenting to the ED  with right hip pain.  Patient report that he was walking tot he mailbox on Friday and suddenly his felt somewhat dizzy and his legs gave way. He usually uses a walker but was not using a walker that day. He denies other associated symptoms of headache, n/v, syncope, LOC, alteration of awareness, speech abnormality, diplopia or paraesthesia. Patient;s father state his pain got worse so he decided to come to the ED today for evaluation.  On arrival to the ED,he was afebrile with blood pressure162/8982mm Hg and pulse rate 66beats/min. There were no focal neurological deficits; he was alert and oriented x4.He had noticeable right hip rotation. ECG showed sinus rhythm of 94beats per minute and the chest X-ray showed no active cardiopulmonary process.X-ray of the right hip showed a displaced comminuted intertrochanteric fracture of the right hip.  Initial labs reveal low platelet counts of 103 otherwise unremarkable, blood glucose 146.X-rays result was discussed with consulting orthopedic by ED physician who will see patient for possible surgery. Patient will be admitted to hospitalist service for further monitoring and management.   VITAL SIGNS:  Blood pressure (!) 147/85, pulse 86, temperature 97.6  F (36.4 C), temperature source Oral, resp. rate 19, height 5\' 6"  (1.676 m), weight 72.6 kg, SpO2 100 %.  I/O:    Intake/Output Summary (Last 24 hours) at 06/21/2019 1417 Last data filed at 06/21/2019 1416 Gross per 24 hour  Intake 240 ml  Output 1650 ml  Net -1410 ml    PHYSICAL EXAMINATION:   GENERAL:  53 y.o.-year-old patient lying in the bed with no acute distress.  EYES: Pupils equal, round, reactive to light and accommodation. No scleral icterus. Extraocular muscles intact.  HEENT: Head atraumatic, normocephalic. Oropharynx and nasopharynx clear.  NECK:  Supple, no jugular venous distention. No thyroid enlargement, no tenderness.  LUNGS: Normal breath sounds bilaterally, no  wheezing, rales,rhonchi or crepitation. No use of accessory muscles of respiration.  CARDIOVASCULAR: S1, S2 normal. No murmurs, rubs, or gallops.  ABDOMEN: Soft, nontender, nondistended. Bowel sounds present. No organomegaly or mass.  EXTREMITIES: No pedal edema, cyanosis, or clubbing.  NEUROLOGIC: Cranial nerves II through XII are intact. Muscle strength 4/5 in all extremities. Sensation intact. Gait not checked.  PSYCHIATRIC: The patient is alert and oriented x 1.  SKIN: No obvious rash, lesion, or ulcer.    DATA REVIEW:   CBC Recent Labs  Lab 06/21/19 0334  WBC 4.8  HGB 10.6*  HCT 30.6*  PLT 132*    Chemistries  Recent Labs  Lab 06/17/19 1338  06/19/19 0553  NA 145  --  145  K 3.8  --  3.7  CL 113*  --  111  CO2 20*  --  22  GLUCOSE 146*  --  111*  BUN 21*  --  15  CREATININE 1.04   < > 0.88  CALCIUM 8.8*  --  8.1*  AST 31  --   --   ALT 27  --   --   ALKPHOS 56  --   --   BILITOT 0.5  --   --    < > = values in this interval not displayed.    Cardiac Enzymes No results for input(s): TROPONINI in the last 168 hours.  Microbiology Results  Results for orders placed or performed during the hospital encounter of 06/17/19  SARS Coronavirus 2 [facility] order, Performed in [place] hospital lab)     Status: None   Collection Time: 06/17/19  4:42 PM   Specimen: Nasopharyngeal Swab  Result Value Ref Range Status   SARS Coronavirus 2 NEGATIVE NEGATIVE Final    Comment: (NOTE) If result is NEGATIVE SARS-CoV-2 target nucleic acids are NOT DETECTED. The SARS-CoV-2 RNA is generally detectable in upper and lower  respiratory specimens during the acute phase of infection. The lowest  concentration of SARS-CoV-2 viral copies this assay can detect is 250  copies / mL. A negative result does not preclude SARS-CoV-2 infection  and should not be used as the sole basis for treatment or other  patient management decisions.  A negative result may occur with  improper  specimen collection / handling, submission of specimen other  than nasopharyngeal swab, presence of viral mutation(s) within the  areas targeted by this assay, and inadequate number of viral copies  (<250 copies / mL). A negative result must be combined with clinical  observations, patient history, and epidemiological information. If result is POSITIVE SARS-CoV-2 target nucleic acids are DETECTED. The SARS-CoV-2 RNA is generally detectable in upper and lower  respiratory specimens dur ing the acute phase of infection.  Positive  results are indicative of active infection with SARS-CoV-2.  Clinical  correlation with patient history and other diagnostic information is  necessary to determine patient infection status.  Positive results do  not rule out bacterial infection or co-infection with other viruses. If result is PRESUMPTIVE POSTIVE SARS-CoV-2 nucleic acids MAY BE PRESENT.   A presumptive positive result was obtained on the submitted specimen  and confirmed on repeat testing.  While 2019 novel coronavirus  (SARS-CoV-2) nucleic acids may be present in the submitted sample  additional confirmatory testing may be necessary for epidemiological  and / or clinical management purposes  to differentiate between  SARS-CoV-2 and other Sarbecovirus currently known to infect humans.  If clinically indicated additional testing with an alternate test  methodology [phone] is advised. The SARS-CoV-2 RNA is generally  detectable in upper and lower respiratory sp ecimens during the acute  phase of infection. The expected result is Negative. Fact Sheet for Patients:  BoilerBrush.com.cyhttps://www.fda.gov/media/136312/download Fact Sheet for Healthcare Providers: https://pope.com/https://www.fda.gov/media/136313/download This test is not yet approved or cleared by the [place] FDA and has been authorized for detection and/or diagnosis of SARS-CoV-2 by FDA under an Emergency Use Authorization (EUA).  This EUA will remain in  effect (meaning this test can be used) for the duration of the COVID-19 declaration under Section 564(b)(1) of the Act, 21 U.S.C. section 360bbb-3(b)(1), unless the authorization is terminated or revoked sooner. Performed at [facility], [address] [place], [place] [address]   Surgical PCR screen     Status: None   Collection Time: 06/17/19  9:55 PM   Specimen: Nasal Mucosa; Nasal Swab  Result Value Ref Range Status   MRSA, PCR NEGATIVE NEGATIVE Final   Staphylococcus aureus NEGATIVE NEGATIVE Final    Comment: (NOTE) The Xpert SA Assay (FDA approved for NASAL specimens in patients 55 years of age and older), is one component of a comprehensive surveillance program. It is not intended to diagnose infection nor to guide or monitor treatment. Performed at [facility], [address] [place], [place] [address]     RADIOLOGY:  No results found.  EKG:   Orders placed or performed during the hospital encounter of 06/17/19  . EKG 12-Lead  . EKG 12-Lead  . ED EKG  . ED EKG      Management plans discussed with the patient, family and they are in agreement.  CODE STATUS:     Code Status Orders  (From admission, onward)         Start     Ordered   06/17/19 1522  Full code  Continuous     06/17/19 1526        Code Status History    This patient has a current code status but no historical code status.   Advance Care Planning Activity    Advance Directive Documentation     Most Recent Value  Type of Advance Directive  Healthcare Power of Attorney  Pre-existing out of facility DNR order (yellow form or pink MOST form)  -  "MOST" Form in Place?  -      TOTAL TIME TAKING CARE OF THIS PATIENT: 35 minutes.    Altamese DillingVaibhavkumar Jermon Chalfant M.D on 06/21/2019 at 2:17 PM  Between 7am to 6pm - Pager - [phone]  After 6pm go to [email or web address] - password [email or web address]  [facility]  Office  [phone]  CC: Primary care physician;  Smitty CordsKaramalegos, Alexander J, DO   Note: This dictation was prepared with Dragon dictation along with smaller phrase technology. Any transcriptional errors that  result from this process are unintentional.

## 2019-06-21 NOTE — TOC Progression Note (Signed)
Transition of Care [facility]) - Progression Note    Patient Details  Name: Andres [name] MRN: [id number] Date of Birth: [birth year]  Transition of Care [facility]) CM/SW Contact  Cayci Mcnabb, Lenice Llamas Phone Number: [phone]  06/21/2019, 9:24 AM  Clinical Narrative: Level 2 PASARR screener called Clinical Social Worker (CSW) today to complete assessment. PASARR is still pending. [facility] SNF authorization has been received. Tammy admissions coordinator at [facility] is aware of above.     Expected Discharge Plan: [place] Barriers to Discharge: Continued Medical Work up  Expected Discharge Plan and Services Expected Discharge Plan: [place] In-house Referral: Clinical Social Work Discharge Planning Services: CM Consult   Living arrangements for the past 2 months: Single Family Home                                       Social Determinants of Health (SDOH) Interventions    Readmission Risk Interventions No flowsheet data found.

## 2019-06-21 NOTE — Accreditation Note (Signed)
Pt transported to [facility] via EMS.

## 2019-06-21 NOTE — Progress Notes (Signed)
Report called and given to [facility] at [facility]. IVs removed. Dressings changed. Stable condition. Patient is now ready for transport.

## 2019-06-22 ENCOUNTER — Other Ambulatory Visit: Payer: Self-pay | Admitting: Psychiatry

## 2019-06-22 DIAGNOSIS — G2401 Drug induced subacute dyskinesia: Secondary | ICD-10-CM

## 2019-06-22 NOTE — TOC Progression Note (Signed)
Transition of Care [facility]) - Progression Note    Patient Details  Name: Andres [name] MRN: [id number] Date of Birth: [birth year]  [facility] CM/SW Contact  Molly Maselli, Veronia Beets, [place] Phone Number: 06/22/2019, 3:12 PM  Clinical Narrative:   06/22/2019 PASARR received [id number] F expires 08/21/2019. Tammy admissions coordinator at [facility] is aware of above.         Expected Discharge Plan: Skilled Nursing Facility Barriers to Discharge: Barriers Resolved  Expected Discharge Plan and Services Expected Discharge Plan: [place] In-house Referral: Clinical Social Work Discharge Planning Services: CM Consult   Living arrangements for the past 2 months: Single Family Home Expected Discharge Date: 06/21/19                                     Social Determinants of Health (SDOH) Interventions    Readmission Risk Interventions No flowsheet data found.

## 2019-06-27 ENCOUNTER — Ambulatory Visit: Payer: Medicare Other | Admitting: Psychiatry

## 2019-06-27 ENCOUNTER — Other Ambulatory Visit: Payer: Self-pay

## 2019-06-27 DIAGNOSIS — F70 Mild intellectual disabilities: Secondary | ICD-10-CM

## 2019-06-27 DIAGNOSIS — F251 Schizoaffective disorder, depressive type: Secondary | ICD-10-CM

## 2019-06-27 DIAGNOSIS — G2401 Drug induced subacute dyskinesia: Secondary | ICD-10-CM

## 2019-06-27 NOTE — Progress Notes (Signed)
Andres [name] -sister wanted to give an update about Andres [name]. Andres [name] was not present in session. He is currently at [facility]. Discussed to schedule an appointment with Andres [name] present .She will call Andres [name] at front desk.

## 2019-06-28 ENCOUNTER — Telehealth: Payer: Self-pay

## 2019-06-28 NOTE — Telephone Encounter (Signed)
Not sure what she is asking?? Please clarify. I only asked her to schedule an appointment with me at the next available .

## 2019-06-28 NOTE — Telephone Encounter (Signed)
pt sister called left message that the pcp is not there in the office and she needs to know what to do since he is not  in

## 2019-07-02 NOTE — Telephone Encounter (Signed)
sister was calling you back with the information that you needed from [facility] and she had some question for you also  [phone]

## 2019-07-03 NOTE — Telephone Encounter (Signed)
Returned call to Butch Penny - sister . She wants to know how to arrange the appointment for [facility]. Discussed the same with her, advised to call front desk with Virtual visit contact info and talk to [place]. She also wants to know , how long they can keep patients at SNF - she is worried about [place].Discussed with her that I am not familiar with that and to have a family meeting arranged with staff ,provider at SNF.

## 2019-07-05 ENCOUNTER — Ambulatory Visit (INDEPENDENT_AMBULATORY_CARE_PROVIDER_SITE_OTHER): Payer: Medicare Other | Admitting: Psychiatry

## 2019-07-05 ENCOUNTER — Other Ambulatory Visit: Payer: Self-pay

## 2019-07-05 ENCOUNTER — Encounter: Payer: Self-pay | Admitting: Psychiatry

## 2019-07-05 DIAGNOSIS — F251 Schizoaffective disorder, depressive type: Secondary | ICD-10-CM | POA: Diagnosis not present

## 2019-07-05 DIAGNOSIS — F70 Mild intellectual disabilities: Secondary | ICD-10-CM | POA: Diagnosis not present

## 2019-07-05 DIAGNOSIS — G2401 Drug induced subacute dyskinesia: Secondary | ICD-10-CM

## 2019-07-05 MED ORDER — QUETIAPINE FUMARATE 25 MG PO TABS: 25.0000 mg | ORAL_TABLET | Freq: Every day | ORAL | 0 refills | Status: DC

## 2019-07-05 NOTE — Progress Notes (Signed)
**Andres [name] De-Identified via Obfuscation** Virtual Visit via Telephone Andres [name]  I connected with Andres Andres [name] on 07/05/19 at 11:45 AM EDT by telephone and verified that I am speaking with the correct person using two identifiers.   I discussed the limitations, risks, security and privacy concerns of performing an evaluation and management service by telephone and the availability of in person appointments. I also discussed with the patient that there may be a patient responsible charge related to this service. The patient expressed understanding and agreed to proceed.     I discussed the assessment and treatment plan with the patient. The patient was provided an opportunity to ask questions and all were answered. The patient agreed with the plan and demonstrated an understanding of the instructions.   The patient was advised to call back or seek an in-person evaluation if the symptoms worsen or if the condition fails to improve as anticipated.  Andres Andres [name]  07/05/2019 8:03 PM Andres Andres [name]  MRN:  [id number]  Chief Complaint:  Chief Complaint    Follow-up     HPI: Andres Andres [name] is a 53 year old Caucasian male, has a history of schizoaffective disorder, tardive dyskinesia, hypothyroidism, mild intellectual disability was evaluated by telemedicine today.  Patient was offered video, however since being at the SNF could not connect by video.  Spoke to patient by phone.  He reports he is doing okay.  He reports he is able to walk better and is making progress.  He asked for his medication refills.  He reports sleep and appetite as fair.  He denies any perceptual disturbances.  Per Andres Andres [name].  Patient needed propranolol as needed once or twice which helped him  .'  I spoke to Andres Andres [name], sister by phone -Per sister patient has been making some comments about the day program - [facility] , recently.  She is not really sure what is going on since he seems to be upset about that.  Discussed with Andres Andres [name] not to discuss anything that stresses him out at this time and to let him recover from his fracture.  Once he returns Andres [name] which according to the nursing manager may happen in the next couple of weeks, advised sister to call the clinic to schedule a follow-up appointment.  For now will continue medications as scheduled.'   Visit Diagnosis:    ICD-10-CM   1. Schizoaffective disorder, depressive type ([place])  F25.1   2. Tardive dyskinesia  G24.01   3. Mild intellectual disability  F70     Past Psychiatric History: I have reviewed past psychiatric history from my progress Andres [name] on 03/29/2018.  Past Medical History:  Past Medical History:  Diagnosis Date  . Allergy     Past Surgical History:  Procedure Laterality Date  . ANKLE SURGERY    . COLON SURGERY    . HERNIA REPAIR    . INTRAMEDULLARY (IM) NAIL INTERTROCHANTERIC Right 06/18/2019   Procedure: INTRAMEDULLARY (IM) NAIL INTERTROCHANTRIC;  Surgeon: Lovell Sheehan, MD;  Location: [facility] ORS;  Service: Orthopedics;  Laterality: Right;  . TONSILLECTOMY      Family Psychiatric History: I have reviewed family psychiatric history from my progress Andres [name] on 03/29/2018.  Family History:  Family History  Problem Relation Age of Onset  . Diabetes Mother   . Stroke Mother   . Cancer Father        colon  . Colon cancer Father   . Colon cancer Other   . Mental illness Neg Hx     Social History: I have reviewed social history from my progress Andres [name] on 03/29/2018. Social History   Socioeconomic History  . Marital status: Single    Spouse name: Not on file  . Number of children: 0  . Years of education: [employer]  . Highest education level: High school graduate  Occupational History    Comment: disabled   Social Needs  . Financial resource strain: Not hard at all  . Food insecurity    Worry: Never true    Inability: Never true  . Transportation needs    Medical: Yes    Non-medical: Yes  Tobacco Use  . Smoking status: Never Smoker  . Smokeless tobacco: Never Used  Substance and Sexual Activity  . Alcohol use: No    Frequency: Never  . Drug use: No  . Sexual activity: Not Currently  Lifestyle  . Physical activity    Days per week: 0 days    Minutes per session: 0 min  . Stress: Not at all  Relationships  . Social connections    Talks on phone: More than three times a week    Gets together: More than three times a week    Attends religious service: Never    Active member of club or organization: No    Attends meetings of clubs or organizations: Never    Relationship status: Never married  Other Topics Concern  . Not on file  Social History Narrative   Lives with parents, mental handicap, uses walker    Allergies:  Allergies  Allergen Reactions  . Prednisone Other (See Comments)    Did not like the way it made him feel   . Lamotrigine Rash    Metabolic Disorder Labs: Lab Results  Component Value Date   HGBA1C 4.8 10/05/2018   MPG 91 10/05/2018   No results found for: PROLACTIN No results found for: CHOL, TRIG, HDL, CHOLHDL, VLDL, LDLCALC Lab Results  Component Value Date   TSH 4.28 02/06/2019   TSH 2.69 10/05/2018    Therapeutic Level Labs: No results found for: LITHIUM No results found for: VALPROATE No components found for:  CBMZ  Current Medications: Current Outpatient Medications  Medication Sig Dispense Refill  . allopurinol (ZYLOPRIM) 300 MG tablet Take 1 tablet (300 mg total) by mouth daily. 90 tablet 3  . cholecalciferol (VITAMIN D) 1000 units tablet Take 1,000 Units by mouth daily.    . citalopram (CELEXA) 10 MG tablet Take 1 tablet (10 mg total) by mouth daily. 90 tablet 2  . enoxaparin (LOVENOX) 40 MG/0.4ML injection Inject 0.4 mLs (40 mg  total) into the skin daily for 20 days. 8 mL 0  . fexofenadine (ALLEGRA) 180 MG tablet Take 180 mg by mouth.    Marland Kitchen. HYDROcodone-acetaminophen (NORCO/VICODIN) 5-325 MG tablet Take 1-2 tablets by mouth every 6 (six) hours as needed for moderate pain or severe pain. 20 tablet 0  . INGREZZA 80 MG CAPS TAKE 1 CAPSULE BY MOUTH AT BEDTIME 30 capsule 7  . levothyroxine (SYNTHROID) 25 MCG tablet TAKE 1 TABLET BY MOUTH DAILY BEFORE BREAKFAST. ON EMPTY STOMACH, NOT TO TAKE WITH OTHER MEDICINES 90 tablet 1  . ondansetron (ZOFRAN) 4 MG tablet Take  1 tablet (4 mg total) by mouth every 6 (six) hours as needed for nausea. 20 tablet 0  . propranolol (INDERAL) 10 MG tablet Take 1 tablet (10 mg total) by mouth 2 (two) times daily as needed. For severe anxiety/agitation 180 tablet 2  . QUEtiapine (SEROQUEL) 25 MG tablet Take 0.5 tablets (12.5 mg total) by mouth daily as needed. For severe anxiety/agitation (Patient taking differently: Take 12.5 mg by mouth daily as needed. For severe anxiety/agitationDR EAPEN) 15 tablet 2  . QUEtiapine (SEROQUEL) 25 MG tablet Take 1 tablet (25 mg total) by mouth at bedtime. 90 tablet 0  . senna-docusate (SENOKOT-S) 8.6-50 MG tablet Take 1 tablet by mouth 2 (two) times daily. 30 tablet 0  . tamsulosin (FLOMAX) 0.4 MG CAPS capsule Take 1 capsule (0.4 mg total) by mouth daily. 30 capsule 0  . thiamine (VITAMIN B-1) 100 MG tablet Take 100 mg by mouth daily.    . traZODone (DESYREL) 50 MG tablet Take 0.5 tablets (25 mg total) by mouth at bedtime as needed for sleep. 135 tablet 2   No current facility-administered medications for this visit.      Musculoskeletal: Strength & Muscle Tone: UTA Gait & Station: UTA  Patient leans: N/A  Psychiatric Specialty Exam: Review of Systems  Psychiatric/Behavioral: The patient is not nervous/anxious.   All other systems reviewed and are negative.   There were no vitals taken for this visit.There is no height or weight on file to calculate BMI.   General Appearance: UTA  Eye Contact:  UTA  Speech:  Clear and Coherent  Volume:  Normal  Mood:  Euthymic  Affect:  UTA  Thought Process:  Goal Directed and Descriptions of Associations: Intact  Orientation:  Full (Time, Place, and Person)  Thought Content: Logical   Suicidal Thoughts:  No  Homicidal Thoughts:  No  Memory:  Immediate;   Fair Recent;   Fair Remote;   Fair  Judgement:  Fair  Insight:  Fair  Psychomotor Activity:  UTA  Concentration:  Concentration: Fair and Attention Span: Fair  Recall:  [employer]Fair  Fund of Knowledge: Fair  Language: Fair  Akathisia:  No  Handed:  Right  AIMS (if indicated): Denies tremors, rigidity  Assets:  Communication Skills Desire for Improvement Social Support  ADL's:  Intact  Cognition: WNL  Sleep:  Fair   Screenings: AIMS     [facility] Visit from [facility]07/2018 [facility]07[facility]2018 [facility] Office Visit from [facility]/2018 [facility]  AIMS Total Score  5  8  8  8  9     GAD-7     [facility]  Total GAD-7 Score  15    PHQ2-9     [facility]  PHQ-2 Total Score  0  2  PHQ-9 Total Score  -  10       Assessment and Plan: [name] is a 53 year old male who has a history of schizoaffective disorder, mild ID, TD, hypothyroidism was evaluated by phone today.  Patient is currently at a skilled nursing facility after a fracture of his leg and is making progress.  Patient seems to be doing okay on the current medication.  Plan Schizoaffective disorder-stable Seroquel 25 mg  p.o. nightly-reduced dosage Trazodone 25 to 50 mg p.o. nightly as  needed Celexa 10 mg p.o. daily Propranolol 10 mg p.o. twice daily PRN for severe anxiety or agitation.  For TD-improving Ingrezza  80 mg p.o. daily.  Collateral information obtained from nursing staff- Konrad DoloresKim Hicks.  Also discussed treatment plan with [name] - sister.  Follow-up in clinic as needed, discussed with sister to call the clinic back after [name] is discharged from SNF.  I have spent atleast 15 minutes non face to face with patient today. More than 50 % of the time was spent for psychoeducation and supportive psychotherapy and care coordination.  This Andres [name] was generated in part or whole with voice recognition software. Voice recognition is usually quite accurate but there are transcription errors that can and very often do occur. I apologize for any typographical errors that were not detected and corrected.        Jomarie LongsSaramma Jeannifer Drakeford, MD 07/05/2019, 8:03 PM

## 2019-07-13 ENCOUNTER — Telehealth: Payer: Self-pay

## 2019-07-13 NOTE — Telephone Encounter (Signed)
I called and spoke with Butch Penny and clarified the patient current medication list. I also confirmed that the patient has an upcoming appt on Monday. She verbalize understanding, no questions or concerns.

## 2019-07-16 ENCOUNTER — Other Ambulatory Visit: Payer: Self-pay

## 2019-07-16 ENCOUNTER — Ambulatory Visit (INDEPENDENT_AMBULATORY_CARE_PROVIDER_SITE_OTHER): Payer: Medicare Other | Admitting: Family Medicine

## 2019-07-16 ENCOUNTER — Encounter: Payer: Self-pay | Admitting: Family Medicine

## 2019-07-16 DIAGNOSIS — Z8781 Personal history of (healed) traumatic fracture: Secondary | ICD-10-CM

## 2019-07-16 DIAGNOSIS — S72001D Fracture of unspecified part of neck of right femur, subsequent encounter for closed fracture with routine healing: Secondary | ICD-10-CM | POA: Diagnosis not present

## 2019-07-16 DIAGNOSIS — F251 Schizoaffective disorder, depressive type: Secondary | ICD-10-CM | POA: Diagnosis not present

## 2019-07-16 NOTE — Progress Notes (Signed)
Virtual Visit via Telephone The purpose of this virtual visit is to provide medical care while limiting exposure to the novel coronavirus (COVID19) for both patient and office staff.  Consent was obtained for phone visit:  Yes.   Answered questions that patient had about telehealth interaction:  Yes.   I discussed the limitations, risks, security and privacy concerns of performing an evaluation and management service by telephone. I also discussed with the patient that there may be a patient responsible charge related to this service. The patient expressed understanding and agreed to proceed.  Patient Location: Home Provider [facility]  ---------------------------------------------------------------------- Chief Complaint  Patient presents with  . hip surgery    rehab  RIGHT HIP FRACTURE   S: Reviewed CMA documentation. I have called patient and spoke with sister and primary caregiver Tonette BihariDonna Perry and also patient gathered additional HPI as follows:  HOSPITAL FOLLOW-UP VISIT  Hospital/Location: [facility] Date of Admission: 06/17/19 Date of Discharge: 06/21/19 Transitions of care telephone call: Not completed  Reason for Admission / Diagnosis: R Hip Pain / Fracture  FOLLOW-UP - Hospital H&P and Discharge Summary have been reviewed Brief summary of recent course, patient had symptoms of unwitnessed fall R hip fracture, hospitalized and proceeded with orthopedic surgery to fix R hip. Managed by Dr Odis LusterBowers, and discharged to [facility] for PT/OT SNF.  Now discharged from SNF rehab to home last week  - Today reports overall has done well after discharge. Symptoms of R hip pain have resolved   - New medications on discharge: Zofran, Flomax - asking about these, see below - Changes to current meds on discharge: None  Orthopedics, R hip fracture, Dr Odis LusterBowers, initial post op follow-up, had x-ray and removed stitches, next week Thursday with follow-up  after leaving [facility] - Currently able to use walker and get into shower, has hand rails, can get to restroom. He can get into and out of bed as well, has help nearby for assistance when transitioning positions. Overall he is doing well with making progress - He has had home health PT evaluation, awaiting further PT treatment sessions. Had OT initial therapy today, more apt to come this week. Evaluated his home situation and they will work on improving his mobility and checked for safety.  - Question on meds: - Zofran PRN nausea due to pain medicine, during rehab but he has not needed it, has been off of it now, eating well, not taking pain medicine. He does not need this anymore they are asking if can DC it - Flomax was rx in hospital unable to clearly determine reason for this, he has no known BPH diagnosis he has not needed it, currently urinating without difficulty, asking to DC this - He takes Propranolol 10mg  nightly with dinner, in past per Dr Elna BreslowEappen, hospital record says it was ordered BID PRN they have not taken it BID, asking if can still only take PM for agitation PRN  Last seen by Dr Elna BreslowEappen on 07/05/19 chart reviewed  Denies any fevers, chills, sweats, body ache, cough, shortness of breath, sinus pain or pressure, headache, abdominal pain, diarrhea  Past Medical History:  Diagnosis Date  . Allergy    Social History   Tobacco Use  . Smoking status: Never Smoker  . Smokeless tobacco: Never Used  Substance Use Topics  . Alcohol use: No    Frequency: Never  . Drug use: No   I have reviewed the discharge medication list, and have  reconciled the current and discharge medications today.   Current Outpatient Medications:  .  allopurinol (ZYLOPRIM) 300 MG tablet, Take 1 tablet (300 mg total) by mouth daily., Disp: 90 tablet, Rfl: 3 .  cholecalciferol (VITAMIN D) 1000 units tablet, Take 1,000 Units by mouth daily., Disp: , Rfl:  .  citalopram (CELEXA) 10 MG tablet, Take 1  tablet (10 mg total) by mouth daily., Disp: 90 tablet, Rfl: 2 .  fexofenadine (ALLEGRA) 180 MG tablet, Take 180 mg by mouth., Disp: , Rfl:  .  INGREZZA 80 MG CAPS, TAKE 1 CAPSULE BY MOUTH AT BEDTIME, Disp: 30 capsule, Rfl: 7 .  levothyroxine (SYNTHROID) 25 MCG tablet, TAKE 1 TABLET BY MOUTH DAILY BEFORE BREAKFAST. ON EMPTY STOMACH, NOT TO TAKE WITH OTHER MEDICINES, Disp: 90 tablet, Rfl: 1 .  propranolol (INDERAL) 10 MG tablet, Take 1 tablet (10 mg total) by mouth 2 (two) times daily as needed. For severe anxiety/agitation, Disp: 180 tablet, Rfl: 2 .  QUEtiapine (SEROQUEL) 25 MG tablet, Take 1 tablet (25 mg total) by mouth at bedtime., Disp: 90 tablet, Rfl: 0 .  thiamine (VITAMIN B-1) 100 MG tablet, Take 100 mg by mouth daily., Disp: , Rfl:  .  traZODone (DESYREL) 50 MG tablet, Take 0.5 tablets (25 mg total) by mouth at bedtime as needed for sleep., Disp: 135 tablet, Rfl: 2 .  enoxaparin (LOVENOX) 40 MG/0.4ML injection, Inject 0.4 mLs (40 mg total) into the skin daily for 20 days., Disp: 8 mL, Rfl: 0 .  QUEtiapine (SEROQUEL) 25 MG tablet, Take 0.5 tablets (12.5 mg total) by mouth daily as needed. For severe anxiety/agitation (Patient not taking: Reported on 07/16/2019), Disp: 15 tablet, Rfl: 2 .  senna-docusate (SENOKOT-S) 8.6-50 MG tablet, Take 1 tablet by mouth 2 (two) times daily., Disp: 30 tablet, Rfl: 0  [facility] 02/19/2019 07/07/2018  Decreased Interest 0 1  Down, Depressed, Hopeless 0 1  PHQ - 2 Score 0 2  Altered sleeping - 2  Tired, decreased energy - 2  Change in appetite - 0  Feeling bad or failure about yourself  - 1  Trouble concentrating - 2  Moving slowly or fidgety/restless - 1  Suicidal thoughts - 0  PHQ-9 Score - 10  Difficult doing work/chores - Somewhat difficult    GAD 7 : Generalized Anxiety Score 07/07/2018  Nervous, Anxious, on Edge 1  Control/stop worrying 3  Worry too much - different things 3  Trouble relaxing 3  Restless 1  Easily annoyed or  irritable 1  Afraid - awful might happen 3  Total GAD 7 Score 15  Anxiety Difficulty Somewhat difficult    -------------------------------------------------------------------------- O: No physical exam performed due to remote telephone encounter.  Lab results reviewed.  Recent Results (from the past 2160 hour(s))  CBC with Differential     Status: Abnormal   Collection Time: 06/17/19  1:38 PM  Result Value Ref Range   WBC 7.4 4.0 - 10.5 K/uL   RBC 4.55 4.22 - 5.81 MIL/uL   Hemoglobin 14.0 13.0 - 17.0 g/dL   HCT 40.941.7 81.139.0 - 91.452.0 %   MCV 91.6 80.0 - 100.0 fL   MCH 30.8 26.0 - 34.0 pg   MCHC 33.6 30.0 - 36.0 g/dL   RDW 78.213.6 95.611.5 - 21.315.5 %   Platelets 103 (L) 150 - 400 K/uL    Comment: Immature Platelet Fraction may be clinically indicated, consider ordering this additional test [id number]    nRBC 0.0 0.0 - 0.2 %  Neutrophils Relative % 74 %   Neutro Abs 5.4 1.7 - 7.7 K/uL   Lymphocytes Relative 12 %   Lymphs Abs 0.9 0.7 - 4.0 K/uL   Monocytes Relative 14 %   Monocytes Absolute 1.0 0.1 - 1.0 K/uL   Eosinophils Relative 0 %   Eosinophils Absolute 0.0 0.0 - 0.5 K/uL   Basophils Relative 0 %   Basophils Absolute 0.0 0.0 - 0.1 K/uL   Immature Granulocytes 0 %   Abs Immature Granulocytes 0.03 0.00 - 0.07 K/uL    Comment: Performed at [facility], [address] [place], [place] [address]  Comprehensive metabolic panel     Status: Abnormal   Collection Time: 06/17/19  1:38 PM  Result Value Ref Range   Sodium 145 135 - 145 mmol/L   Potassium 3.8 3.5 - 5.1 mmol/L   Chloride 113 (H) 98 - 111 mmol/L   CO2 20 (L) 22 - 32 mmol/L   Glucose, Bld 146 (H) 70 - 99 mg/dL   BUN 21 (H) 6 - 20 mg/dL   Creatinine, Ser 6.04 0.61 - 1.24 mg/dL   Calcium 8.8 (L) 8.9 - 10.3 mg/dL   Total Protein 6.6 6.5 - 8.1 g/dL   Albumin 3.8 3.5 - 5.0 g/dL   AST 31 15 - 41 U/L   ALT 27 0 - 44 U/L   Alkaline Phosphatase 56 38 - 126 U/L   Total Bilirubin 0.5 0.3 - 1.2 mg/dL   GFR calc non Af  Amer >60 >60 mL/min   GFR calc Af Amer >60 >60 mL/min   Anion gap 12 5 - 15    Comment: Performed at [facility], [address] [place], [place] [address]  Protime-INR     Status: None   Collection Time: 06/17/19  1:38 PM  Result Value Ref Range   Prothrombin Time 12.2 11.4 - 15.2 seconds   INR 0.9 0.8 - 1.2    Comment: (NOTE) INR goal varies based on device and disease states. Performed at [facility], [address] [place], [place] [address]   APTT     Status: None   Collection Time: 06/17/19  1:38 PM  Result Value Ref Range   aPTT 25 24 - 36 seconds    Comment: Performed at [facility], [address] [place], [place] [address]  HIV antibody (Routine Testing)     Status: None   Collection Time: 06/17/19  4:42 PM  Result Value Ref Range   HIV Screen 4th Generation wRfx Non Reactive Non Reactive    Comment: (NOTE) Performed At: [facility] [address] [place], [place] [id number] Jolene Schimke MD QI:[id number]   SARS Coronavirus 2 [facility] order, Performed in [facility] hospital lab)     Status: None   Collection Time: 06/17/19  4:42 PM   Specimen: Nasopharyngeal Swab  Result Value Ref Range   SARS Coronavirus 2 NEGATIVE NEGATIVE    Comment: (NOTE) If result is NEGATIVE SARS-CoV-2 target nucleic acids are NOT DETECTED. The SARS-CoV-2 RNA is generally detectable in upper and lower  respiratory specimens during the acute phase of infection. The lowest  concentration of SARS-CoV-2 viral copies this assay can detect is 250  copies / mL. A negative result does not preclude SARS-CoV-2 infection  and should not be used as the sole basis for treatment or other  patient management decisions.  A negative result may occur with  improper specimen collection / handling, submission of specimen other  than nasopharyngeal swab, presence of viral mutation(s) within the  areas targeted by this assay, and inadequate number of viral copies   (<250 copies / mL). A negative result must be combined with clinical  observations, patient history, and epidemiological information. If result is POSITIVE SARS-CoV-2 target nucleic acids are DETECTED. The SARS-CoV-2 RNA is generally detectable in upper and lower  respiratory specimens dur ing the acute phase of infection.  Positive  results are indicative of active infection with SARS-CoV-2.  Clinical  correlation with patient history and other diagnostic information is  necessary to determine patient infection status.  Positive results do  not rule out bacterial infection or co-infection with other viruses. If result is PRESUMPTIVE POSTIVE SARS-CoV-2 nucleic acids MAY BE PRESENT.   A presumptive positive result was obtained on the submitted specimen  and confirmed on repeat testing.  While 2019 novel coronavirus  (SARS-CoV-2) nucleic acids may be present in the submitted sample  additional confirmatory testing may be necessary for epidemiological  and / or clinical management purposes  to differentiate between  SARS-CoV-2 and other Sarbecovirus currently known to infect humans.  If clinically indicated additional testing with an alternate test  methodology [phone]) is advised. The SARS-CoV-2 RNA is generally  detectable in upper and lower respiratory sp ecimens during the acute  phase of infection. The expected result is Negative. Fact Sheet for Patients:  StrictlyIdeas.no Fact Sheet for Healthcare Providers: BankingDealers.co.za This test is not yet approved or cleared by the [place] FDA and has been authorized for detection and/or diagnosis of SARS-CoV-2 by FDA under an Emergency Use Authorization (EUA).  This EUA will remain in effect (meaning this test can be used) for the duration of the COVID-19 declaration under Section 564(b)(1) of the Act, 21 U.S.C. section 360bbb-3(b)(1), unless the authorization is terminated  or revoked sooner. Performed at [facility], [address] [place], [place] [address]   ABO/Rh     Status: None   Collection Time: 06/17/19  4:42 PM  Result Value Ref Range   ABO/RH(D)      A POS Performed at [facility] [address] [place], [place] [address]   Surgical PCR screen     Status: None   Collection Time: 06/17/19  9:55 PM   Specimen: Nasal Mucosa; Nasal Swab  Result Value Ref Range   MRSA, PCR NEGATIVE NEGATIVE   Staphylococcus aureus NEGATIVE NEGATIVE    Comment: (NOTE) The Xpert SA Assay (FDA approved for NASAL specimens in patients 72 years of age and older), is one component of a comprehensive surveillance program. It is not intended to diagnose infection nor to guide or monitor treatment. Performed at [facility], [address] [place], [place] [address]   Urinalysis, Complete w Microscopic     Status: Abnormal   Collection Time: 06/18/19  3:05 PM  Result Value Ref Range   Color, Urine YELLOW (A) YELLOW   APPearance CLEAR (A) CLEAR   Specific Gravity, Urine 1.018 1.005 - 1.030   pH 6.0 5.0 - 8.0   Glucose, UA NEGATIVE NEGATIVE mg/dL   Hgb urine dipstick NEGATIVE NEGATIVE   Bilirubin Urine NEGATIVE NEGATIVE   Ketones, ur 5 (A) NEGATIVE mg/dL   Protein, ur NEGATIVE NEGATIVE mg/dL   Nitrite NEGATIVE NEGATIVE   Leukocytes,Ua NEGATIVE NEGATIVE   RBC / HPF 0-5 0 - 5 RBC/hpf   WBC, UA 0-5 0 - 5 WBC/hpf   Bacteria, UA RARE (A) NONE SEEN   Squamous Epithelial /  LPF 0-5 0 - 5    Comment: Performed at [facility], [address] [place], [place] [address]  CBC     Status: Abnormal   Collection Time: 06/18/19 11:05 PM  Result Value Ref Range   WBC 6.9 4.0 - 10.5 K/uL   RBC 4.10 (L) 4.22 - 5.81 MIL/uL   Hemoglobin 12.5 (L) 13.0 - 17.0 g/dL   HCT 60.438.1 (L) 54.039.0 - 98.152.0 %   MCV 92.9 80.0 - 100.0 fL   MCH 30.5 26.0 - 34.0 pg   MCHC 32.8 30.0 - 36.0 g/dL   RDW 19.113.5 47.811.5 - 29.515.5 %   Platelets 107 (L) 150 - 400  K/uL    Comment: Immature Platelet Fraction may be clinically indicated, consider ordering this additional test [id number]    nRBC 0.0 0.0 - 0.2 %    Comment: Performed at [facility], [address] [place], [place] [address]  Creatinine, serum     Status: None   Collection Time: 06/18/19 11:05 PM  Result Value Ref Range   Creatinine, Ser 0.92 0.61 - 1.24 mg/dL   GFR calc non Af Amer >60 >60 mL/min   GFR calc Af Amer >60 >60 mL/min    Comment: Performed at [facility], [address] [place], [place] [address]  CBC     Status: Abnormal   Collection Time: 06/19/19  5:53 AM  Result Value Ref Range   WBC 5.4 4.0 - 10.5 K/uL   RBC 3.70 (L) 4.22 - 5.81 MIL/uL   Hemoglobin 11.4 (L) 13.0 - 17.0 g/dL   HCT 84.134.0 (L) 32.439.0 - 40.152.0 %   MCV 91.9 80.0 - 100.0 fL   MCH 30.8 26.0 - 34.0 pg   MCHC 33.5 30.0 - 36.0 g/dL   RDW 02.713.2 25.311.5 - 66.415.5 %   Platelets 120 (L) 150 - 400 K/uL    Comment: Immature Platelet Fraction may be clinically indicated, consider ordering this additional test [id number]    nRBC 0.0 0.0 - 0.2 %    Comment: Performed at [facility], [address] [place], [place] [address]  Basic metabolic panel     Status: Abnormal   Collection Time: 06/19/19  5:53 AM  Result Value Ref Range   Sodium 145 135 - 145 mmol/L   Potassium 3.7 3.5 - 5.1 mmol/L   Chloride 111 98 - 111 mmol/L   CO2 22 22 - 32 mmol/L   Glucose, Bld 111 (H) 70 - 99 mg/dL   BUN 15 6 - 20 mg/dL   Creatinine, Ser 7.560.88 0.61 - 1.24 mg/dL   Calcium 8.1 (L) 8.9 - 10.3 mg/dL   GFR calc non Af Amer >60 >60 mL/min   GFR calc Af Amer >60 >60 mL/min   Anion gap 12 5 - 15    Comment: Performed at [facility], [address] [place], [place] [address]  CBC     Status: Abnormal   Collection Time: 06/21/19  3:34 AM  Result Value Ref Range   WBC 4.8 4.0 - 10.5 K/uL   RBC 3.50 (L) 4.22 - 5.81 MIL/uL   Hemoglobin 10.6 (L) 13.0 - 17.0 g/dL   HCT 51.830.6 (L) 84.139.0 - 66.052.0 %   MCV  87.4 80.0 - 100.0 fL   MCH 30.3 26.0 - 34.0 pg   MCHC 34.6 30.0 - 36.0 g/dL   RDW 63.012.7 16.011.5 - 10.915.5 %   Platelets 132 (L) 150 - 400 K/uL   nRBC 0.0  0.0 - 0.2 %    Comment: Performed at [facility], [address] [place], [place] [address]    -------------------------------------------------------------------------- A&P:  Problem List Items Addressed This Visit    Schizoaffective disorder, depressive type (HCC)    Other Visit Diagnoses    Closed right hip fracture, with routine healing, subsequent encounter    -  Primary   S/P right hip fracture         Significant improvement R hip fracture s/p surgical repair on 06/17/19 following traumatic fall injury Discharged from [facility] SNF completed Followed by Orthopedics Dr Odis Luster s/p repair and has done well at post op follow-up Proceed with [facility] PT OT therapy now at home, making progress on walker, has caregiver support  DC Zofran PRN nausea, no longer needed DC Flomax, no longer needed, no dx BPH  Reviewed propranolol and his last visit with Psychiatry Dr Elna Breslow 07/05/19, he may continue this nightly as he was for agitation anxiety, and may take additional dose for BID dosing PRN if interested. But not required   No orders of the defined types were placed in this encounter.   Follow-up: - Return as needed  Patient verbalizes understanding with the above medical recommendations including the limitation of remote medical advice.  Specific follow-up and call-back criteria were given for patient to follow-up or seek medical care more urgently if needed.   - Time spent in direct consultation with patient on phone: 15 minutes  Saralyn Pilar, DO [facility] 07/16/2019, 2:19 PM

## 2019-07-16 NOTE — Patient Instructions (Addendum)
Discontinue Zofran and Tamsulosin (Flomax) - no longer needed.  May take Propranolol 10mg  nightly and can add extra dose in daytime if needed, otherwise just once a day is fine.  Follow-up as planned with PT OT and Orthopedics.  Please schedule a Follow-up Appointment to: Return if symptoms worsen or fail to improve.  If you have any other questions or concerns, please feel free to call the office or send a message through [place]. You may also schedule an earlier appointment if necessary.  Additionally, you may be receiving a survey about your experience at our office within a few days to 1 week by e-mail or mail. We value your feedback.  Nobie Putnam, DO [place]

## 2019-07-19 ENCOUNTER — Ambulatory Visit: Payer: Self-pay | Admitting: Family Medicine

## 2019-07-21 ENCOUNTER — Other Ambulatory Visit: Payer: Self-pay | Admitting: Family Medicine

## 2019-07-21 DIAGNOSIS — M1A9XX1 Chronic gout, unspecified, with tophus (tophi): Secondary | ICD-10-CM

## 2019-07-31 ENCOUNTER — Encounter: Payer: Self-pay | Admitting: Psychiatry

## 2019-07-31 ENCOUNTER — Ambulatory Visit (INDEPENDENT_AMBULATORY_CARE_PROVIDER_SITE_OTHER): Payer: Medicare Other | Admitting: Psychiatry

## 2019-07-31 ENCOUNTER — Other Ambulatory Visit: Payer: Self-pay

## 2019-07-31 DIAGNOSIS — F251 Schizoaffective disorder, depressive type: Secondary | ICD-10-CM

## 2019-07-31 DIAGNOSIS — G2401 Drug induced subacute dyskinesia: Secondary | ICD-10-CM

## 2019-07-31 DIAGNOSIS — F70 Mild intellectual disabilities: Secondary | ICD-10-CM

## 2019-07-31 MED ORDER — QUETIAPINE FUMARATE 25 MG PO TABS: 25.0000 mg | ORAL_TABLET | Freq: Every day | ORAL | 1 refills | Status: DC

## 2019-07-31 NOTE — Progress Notes (Signed)
Virtual Visit via Video Note  I connected with Andres [name] on 07/31/19 at  2:15 PM EDT by a video enabled telemedicine application and verified that I am speaking with the correct person using two identifiers.   I discussed the limitations of evaluation and management by telemedicine and the availability of in person appointments. The patient expressed understanding and agreed to proceed.    I discussed the assessment and treatment plan with the patient. The patient was provided an opportunity to ask questions and all were answered. The patient agreed with the plan and demonstrated an understanding of the instructions.   The patient was advised to call back or seek an in-person evaluation if the symptoms worsen or if the condition fails to improve as anticipated.  BH MD OP Progress Note  07/31/2019 2:32 PM Andres [name]  MRN:  [id number]  Chief Complaint:  Chief Complaint    Follow-up     HPI: Andres [name] is a 53 Caucasian male, has a history of schizoaffective disorder, TD, hypothyroidism, mild intellectual disability was evaluated by telemedicine today.  Patient reports he is currently back home with his family.  He lives with his father.  He reports he is making progress.  He reports he continues to walk with a walker.  He reports his mood as okay.  Sleep is slightly restless since he is unable to sleep in certain position due to his leg fracture.  He however reports it is getting better.  Patient appeared to be alert, oriented to person place time and situation.  Collateral information was obtained from [name]-sister who reported that patient is currently doing better.  His leg which got fractured continues to improve.  He has home OT and PT at this time.  He also has aides coming in Monday through Friday. Per sister patient does have some sleep issues that is more so because he is unable to sleep in certain positions at this time however that seems to be getting better.  She reports she will  work with patient on taking his Seroquel right before bedtime and that way he may be able to sleep better.  Patient as well as family denies any other concerns today.   Visit Diagnosis:    ICD-10-CM   1. Schizoaffective disorder, depressive type (HCC)  F25.1 QUEtiapine (SEROQUEL) 25 MG tablet  2. Tardive dyskinesia  G24.01   3. Mild intellectual disability  F70     Past Psychiatric History: I have reviewed past psychiatric history from my progress note on 03/29/2018.  Past Medical History:  Past Medical History:  Diagnosis Date  . Allergy     Past Surgical History:  Procedure Laterality Date  . ANKLE SURGERY    . COLON SURGERY    . HERNIA REPAIR    . INTRAMEDULLARY (IM) NAIL INTERTROCHANTERIC Right 06/18/2019   Procedure: INTRAMEDULLARY (IM) NAIL INTERTROCHANTRIC;  Surgeon: [name], [name], MD;  Location: [facility] ORS;  Service: Orthopedics;  Laterality: Right;  . TONSILLECTOMY      Family Psychiatric History: I have reviewed family psychiatric history from my progress note on 03/29/2018.  Family History:  Family History  Problem Relation Age of Onset  . Diabetes Mother   . Stroke Mother   . Cancer Father        colon  . Colon cancer Father   . Colon cancer Other   . Mental illness Neg Hx     Social History: I have reviewed social history from my progress note on 03/29/2018.  Social History   Socioeconomic History  . Marital status: Single    Spouse name: Not on file  . Number of children: 0  . Years of education: [employer]  . Highest education level: High school graduate  Occupational History    Comment: disabled  Social Needs  . Financial resource strain: Not hard at all  . Food insecurity    Worry: Never true    Inability: Never true  . Transportation needs    Medical: Yes    Non-medical: Yes  Tobacco Use  . Smoking status: Never Smoker  . Smokeless tobacco: Never Used  Substance and Sexual Activity  . Alcohol use: No    Frequency: Never  . Drug use: No   . Sexual activity: Not Currently  Lifestyle  . Physical activity    Days per week: 0 days    Minutes per session: 0 min  . Stress: Not at all  Relationships  . Social connections    Talks on phone: More than three times a week    Gets together: More than three times a week    Attends religious service: Never    Active member of club or organization: No    Attends meetings of clubs or organizations: Never    Relationship status: Never married  Other Topics Concern  . Not on file  Social History Narrative   Lives with parents, mental handicap, uses walker    Allergies:  Allergies  Allergen Reactions  . Prednisone Other (See Comments)    Did not like the way it made him feel   . Lamotrigine Rash    Metabolic Disorder Labs: Lab Results  Component Value Date   HGBA1C 4.8 10/05/2018   MPG 91 10/05/2018   No results found for: PROLACTIN No results found for: CHOL, TRIG, HDL, CHOLHDL, VLDL, LDLCALC Lab Results  Component Value Date   TSH 4.28 02/06/2019   TSH 2.69 10/05/2018    Therapeutic Level Labs: No results found for: LITHIUM No results found for: VALPROATE No components found for:  CBMZ  Current Medications: Current Outpatient Medications  Medication Sig Dispense Refill  . allopurinol (ZYLOPRIM) 300 MG tablet TAKE 1 TABLET(300 MG) BY MOUTH DAILY 90 tablet 3  . cholecalciferol (VITAMIN D) 1000 units tablet Take 1,000 Units by mouth daily.    . citalopram (CELEXA) 10 MG tablet Take 1 tablet (10 mg total) by mouth daily. 90 tablet 2  . enoxaparin (LOVENOX) 40 MG/0.4ML injection Inject 0.4 mLs (40 mg total) into the skin daily for 20 days. 8 mL 0  . fexofenadine (ALLEGRA) 180 MG tablet Take 180 mg by mouth.    . INGREZZA 80 MG CAPS TAKE 1 CAPSULE BY MOUTH AT BEDTIME 30 capsule 7  . levothyroxine (SYNTHROID) 25 MCG tablet TAKE 1 TABLET BY MOUTH DAILY BEFORE BREAKFAST. ON EMPTY STOMACH, NOT TO TAKE WITH OTHER MEDICINES 90 tablet 1  . propranolol (INDERAL) 10 MG  tablet Take 1 tablet (10 mg total) by mouth 2 (two) times daily as needed. For severe anxiety/agitation 180 tablet 2  . QUEtiapine (SEROQUEL) 25 MG tablet Take 0.5 tablets (12.5 mg total) by mouth daily as needed. For severe anxiety/agitation (Patient not taking: Reported on 07/16/2019) 15 tablet 2  . QUEtiapine (SEROQUEL) 25 MG tablet Take 1 tablet (25 mg total) by mouth at bedtime. 90 tablet 1  . senna-docusate (SENOKOT-S) 8.6-50 MG tablet Take 1 tablet by mouth 2 (two) times daily. 30 tablet 0  . thiamine (VITAMIN B-1) 100  MG tablet Take 100 mg by mouth daily.    . traZODone (DESYREL) 50 MG tablet Take 0.5 tablets (25 mg total) by mouth at bedtime as needed for sleep. 135 tablet 2   No current facility-administered medications for this visit.      Musculoskeletal: Strength & Muscle Tone: UTA Gait & Station: walks with walker Patient leans: N/A  Psychiatric Specialty Exam: Review of Systems  Psychiatric/Behavioral: Negative for hallucinations, substance abuse and suicidal ideas. The patient is not nervous/anxious.   All other systems reviewed and are negative.   There were no vitals taken for this visit.There is no height or weight on file to calculate BMI.  General Appearance: Casual  Eye Contact:  Fair  Speech:  Clear and Coherent  Volume:  Normal  Mood:  Euthymic  Affect:  Appropriate  Thought Process:  Goal Directed and Descriptions of Associations: Intact  Orientation:  Full (Time, Place, and Person)  Thought Content: Logical   Suicidal Thoughts:  No  Homicidal Thoughts:  No  Memory:  Immediate;   Fair Recent;   Fair Remote;   Fair  Judgement:  Fair  Insight:  Fair  Psychomotor Activity:  Normal  Concentration:  Concentration: Fair and Attention Span: Fair  Recall:  [employer]  Fund of Knowledge: Limited  Language: Fair  Akathisia:  No  Handed:  Right  AIMS (if indicated): does have hx of TD - UTA  Assets:  Communication Skills Desire for Improvement Housing Social  Support  ADL's:  Intact  Cognition: WNL  Sleep:  Fair   Screenings: AIMS     [facility] Visit from [facility]/11/2019 [facility] Office Visit from [facility]07/2018 in [facility] Office Visit from [facility]/07/2018 [facility] Office Visit from 05/31/2018 in [facility] Office Visit from 05/16/2018 in [facility]  AIMS Total Score  5  8  8  8  9     GAD-7     [facility] Visit [facility]07[facility]2018 [facility]  Total GAD-7 Score  15    PHQ2-9     [facility]2019 [facility]  PHQ-2 Total Score  0  2  PHQ-9 Total Score  -  10       Assessment and Plan: Andres [name] is a 53 year old male, has a history of schizoaffective disorder, mild ID, TD, hypothyroidism was evaluated by telemedicine today.  Patient is currently making progress.  Patient is currently back home and has good social support system from family as well as has aides coming in.  Plan as noted below.  Plan Schizoaffective disorder-stable Seroquel 25 mg p.o. nightly Trazodone 25 mg to 50 mg p.o. nightly as needed Celexa 10 mg p.o. daily Propranolol 10 mg p.o. twice daily PRN for severe anxiety or agitation  For TD-improving Ingrezza 80 mg p.o. daily  Collateral information was obtained from Joaquin MusicSister [name] as summarized above  Follow-up in clinic in 1 to 2 months or sooner if needed.  Patient has appointment scheduled for September 23.  I have spent atleast 15 minutes non face to face with patient today. More than 50 % of the time was spent for psychoeducation and supportive psychotherapy and care coordination.  This note was generated in part or whole with voice recognition software. Voice recognition is usually quite accurate but there are transcription errors that can and very often do occur. I apologize for  any typographical errors that were not detected and corrected.        Jomarie LongsSaramma Kamarian Sahakian, MD 07/31/2019, 2:32 PM

## 2019-08-17 ENCOUNTER — Other Ambulatory Visit: Payer: Self-pay | Admitting: Psychiatry

## 2019-08-17 DIAGNOSIS — F251 Schizoaffective disorder, depressive type: Secondary | ICD-10-CM

## 2019-08-19 ENCOUNTER — Other Ambulatory Visit: Payer: Self-pay | Admitting: Psychiatry

## 2019-08-19 DIAGNOSIS — F251 Schizoaffective disorder, depressive type: Secondary | ICD-10-CM

## 2019-08-20 ENCOUNTER — Other Ambulatory Visit: Payer: Self-pay | Admitting: Family Medicine

## 2019-08-20 DIAGNOSIS — M1A9XX1 Chronic gout, unspecified, with tophus (tophi): Secondary | ICD-10-CM

## 2019-08-20 MED ORDER — ALLOPURINOL 300 MG PO TABS: 300.0000 mg | ORAL_TABLET | Freq: Every day | ORAL | 3 refills | Status: DC

## 2019-09-19 ENCOUNTER — Other Ambulatory Visit: Payer: Self-pay

## 2019-09-19 ENCOUNTER — Encounter: Payer: Self-pay | Admitting: Psychiatry

## 2019-09-19 ENCOUNTER — Ambulatory Visit (INDEPENDENT_AMBULATORY_CARE_PROVIDER_SITE_OTHER): Payer: Medicare Other | Admitting: Psychiatry

## 2019-09-19 DIAGNOSIS — F70 Mild intellectual disabilities: Secondary | ICD-10-CM

## 2019-09-19 DIAGNOSIS — G2401 Drug induced subacute dyskinesia: Secondary | ICD-10-CM | POA: Diagnosis not present

## 2019-09-19 DIAGNOSIS — F251 Schizoaffective disorder, depressive type: Secondary | ICD-10-CM

## 2019-09-19 NOTE — Progress Notes (Signed)
Virtual Visit via Video Note  I connected with Andres [name] on 09/19/19 at 10:30 AM EDT by a video enabled telemedicine application and verified that I am speaking with the correct person using two identifiers.   I discussed the limitations of evaluation and management by telemedicine and the availability of in person appointments. The patient expressed understanding and agreed to proceed.   I discussed the assessment and treatment plan with the patient. The patient was provided an opportunity to ask questions and all were answered. The patient agreed with the plan and demonstrated an understanding of the instructions.   The patient was advised to call back or seek an in-person evaluation if the symptoms worsen or if the condition fails to improve as anticipated.  BH MD OP Progress Note  09/19/2019 12:44 PM Andres [name]  MRN:  [id number]  Chief Complaint:  Chief Complaint    Follow-up     HPI: Andres PrayBarry is a 53 year old Caucasian male, has a history of schizoaffective disorder, TD, hypothyroidism, mild intellectual disability was evaluated by telemedicine today.  Patient sister-Andres [name], provided collateral information.  Per daughter patient is currently anxious due to the fact that he continues to walk with a walker.  He has been getting help at home.  He has a caregiver coming in during weekdays which has been helpful.  He also lives with father.  Patient however has some difficulty coping with the fact that he cannot walk without a walker yet.  Patient however has been compliant on medications.  Patient reports he has been having some sleep problems.  It is mostly because of the fact that he gets pain when he lays down on his left side due to his recent injury to his leg.    Patient otherwise denies any concerns.  He denies any suicidality, homicidality or perceptual disturbances.  Patient denies any other problems.  Andres LeashDonna wonders whether he can be given Tylenol for his pain.  She believes  pain is causing him to have sleep issues.   Visit Diagnosis:    ICD-10-CM   1. Schizoaffective disorder, depressive type (HCC)  F25.1   2. Tardive dyskinesia  G24.01   3. Mild intellectual disability  F70     Past Psychiatric History: I have reviewed past psychiatric history from my progress note on 03/29/2018.  Past Medical History:  Past Medical History:  Diagnosis Date  . Allergy     Past Surgical History:  Procedure Laterality Date  . ANKLE SURGERY    . COLON SURGERY    . HERNIA REPAIR    . INTRAMEDULLARY (IM) NAIL INTERTROCHANTERIC Right 06/18/2019   Procedure: INTRAMEDULLARY (IM) NAIL INTERTROCHANTRIC;  Surgeon: [name], [name], MD;  Location: [facility] ORS;  Service: Orthopedics;  Laterality: Right;  . TONSILLECTOMY      Family Psychiatric History: I have reviewed family psychiatric history from my progress note on 03/29/2018  Family History:  Family History  Problem Relation Age of Onset  . Diabetes Mother   . Stroke Mother   . Cancer Father        colon  . Colon cancer Father   . Colon cancer Other   . Mental illness Neg Hx     Social History: I have reviewed social history from my progress note on 03/29/2018 Social History   Socioeconomic History  . Marital status: Single    Spouse name: Not on file  . Number of children: 0  . Years of education: [employer]  . Highest education  level: High school graduate  Occupational History    Comment: disabled  Social Needs  . Financial resource strain: Not hard at all  . Food insecurity    Worry: Never true    Inability: Never true  . Transportation needs    Medical: Yes    Non-medical: Yes  Tobacco Use  . Smoking status: Never Smoker  . Smokeless tobacco: Never Used  Substance and Sexual Activity  . Alcohol use: No    Frequency: Never  . Drug use: No  . Sexual activity: Not Currently  Lifestyle  . Physical activity    Days per week: 0 days    Minutes per session: 0 min  . Stress: Not at all  Relationships   . Social connections    Talks on phone: More than three times a week    Gets together: More than three times a week    Attends religious service: Never    Active member of club or organization: No    Attends meetings of clubs or organizations: Never    Relationship status: Never married  Other Topics Concern  . Not on file  Social History Narrative   Lives with parents, mental handicap, uses walker    Allergies:  Allergies  Allergen Reactions  . Prednisone Other (See Comments)    Did not like the way it made him feel   . Lamotrigine Rash    Metabolic Disorder Labs: Lab Results  Component Value Date   HGBA1C 4.8 10/05/2018   MPG 91 10/05/2018   No results found for: PROLACTIN No results found for: CHOL, TRIG, HDL, CHOLHDL, VLDL, LDLCALC Lab Results  Component Value Date   TSH 4.28 02/06/2019   TSH 2.69 10/05/2018    Therapeutic Level Labs: No results found for: LITHIUM No results found for: VALPROATE No components found for:  CBMZ  Current Medications: Current Outpatient Medications  Medication Sig Dispense Refill  . allopurinol (ZYLOPRIM) 300 MG tablet Take 1 tablet (300 mg total) by mouth daily. 90 tablet 3  . cholecalciferol (VITAMIN D) 1000 units tablet Take 1,000 Units by mouth daily.    . citalopram (CELEXA) 10 MG tablet Take 1 tablet (10 mg total) by mouth daily. 90 tablet 2  . enoxaparin (LOVENOX) 40 MG/0.4ML injection Inject 0.4 mLs (40 mg total) into the skin daily for 20 days. 8 mL 0  . fexofenadine (ALLEGRA) 180 MG tablet Take 180 mg by mouth.    . INGREZZA 80 MG CAPS TAKE 1 CAPSULE BY MOUTH AT BEDTIME 30 capsule 7  . levothyroxine (SYNTHROID) 25 MCG tablet TAKE 1 TABLET BY MOUTH DAILY BEFORE BREAKFAST. ON EMPTY STOMACH, NOT TO TAKE WITH OTHER MEDICINES 90 tablet 1  . propranolol (INDERAL) 10 MG tablet Take 1 tablet (10 mg total) by mouth 2 (two) times daily as needed. For severe anxiety/agitation 180 tablet 2  . QUEtiapine (SEROQUEL) 25 MG tablet Take  0.5 tablets (12.5 mg total) by mouth daily as needed. For severe anxiety/agitation (Patient not taking: Reported on 07/16/2019) 15 tablet 2  . QUEtiapine (SEROQUEL) 25 MG tablet Take 1 tablet (25 mg total) by mouth at bedtime. 90 tablet 1  . senna-docusate (SENOKOT-S) 8.6-50 MG tablet Take 1 tablet by mouth 2 (two) times daily. 30 tablet 0  . thiamine (VITAMIN B-1) 100 MG tablet Take 100 mg by mouth daily.    . traZODone (DESYREL) 50 MG tablet TAKE 1/2 TABLET(25 MG) BY MOUTH AT BEDTIME AS NEEDED FOR SLEEP 45 tablet 3  No current facility-administered medications for this visit.      Musculoskeletal: Strength & Muscle Tone: UTA Gait & Station: Walks with walker Patient leans: N/A  Psychiatric Specialty Exam: Review of Systems  Neurological: Positive for tremors.  Psychiatric/Behavioral: The patient is nervous/anxious and has insomnia.   All other systems reviewed and are negative.   There were no vitals taken for this visit.There is no height or weight on file to calculate BMI.  General Appearance: Casual  Eye Contact:  Fair  Speech:  Clear and Coherent  Volume:  Normal  Mood:  Anxious  Affect:  Congruent  Thought Process:  Goal Directed and Descriptions of Associations: Intact  Orientation:  Full (Time, Place, and Person)  Thought Content: Logical   Suicidal Thoughts:  No  Homicidal Thoughts:  No  Memory:  Immediate;   Fair Recent;   Fair Remote;   baseline  Judgement:  Fair  Insight:  Fair  Psychomotor Activity:  Tremor and has TD - chronic  Concentration:  Concentration: Fair and Attention Span: Fair  Recall:  [employer] of Knowledge: Fair  Language: Fair  Akathisia:  No  Handed:  Right  AIMS (if indicated): does has tremors of BL hands - improving  Assets:  Communication Skills Desire for Improvement Housing Social Support  ADL's:  Intact  Cognition: WNL  Sleep:  Poor   Screenings: AIMS     Office Visit from 01/11/2019 in [place] Office Visit from 11/07/2018 in [place] Office Visit from 09/07/2018 in [place] Visit from 05/31/2018 in [place] Visit from 05/16/2018 in [place] Total Score  5  8  8  8  9     GAD-7     Office Visit from 07/07/2018 in [facility]  Total GAD-7 Score  15    PHQ2-9     Office Visit from 02/19/2019 in [facility] Office Visit from 07/07/2018 in [place]  PHQ-2 Total Score  0  2  PHQ-9 Total Score  -  10       Assessment and Plan: Keishon is a 53 year old Caucasian male, has a history of schizoaffective disorder, mild ID, TD, hypothyroidism was evaluated by telemedicine today.  Patient continues to have psychosocial stressor of his recent fracture as well as the recovery process from the same.  He however also struggles with sleep.  We will continue plan as noted below.  Plan Schizoaffective disorder-stable Seroquel 25 mg p.o. nightly Trazodone 25 mg to 50 mg p.o. nightly as needed.  Discussed with patient to start taking a whole tablet since he has been taking only half tablet. Celexa 10 mg p.o. daily Propranolol 10 mg p.o. twice daily PRN for severe agitation  For TD-improving Ingrezza 80 mg p.o. daily  Collateral information obtained from sister-Andres [name] summarized above.  Discussed with patient as well as sister to use Tylenol at bedtime for pain and also to communicate with his provider who is treating his fracture.  He could also use a pillow to make himself more comfortable when he lays down.  Follow-up in clinic in 1-1/2 months or sooner if needed.  November 2 at 11 AM  I have spent atleast 15 minutes non face to face with patient today. More than 50 % of the time was spent for psychoeducation and supportive psychotherapy and care coordination. This note was generated in part or  whole  with voice recognition software. Voice recognition is usually quite accurate but there are transcription errors that can and very often do occur. I apologize for any typographical errors that were not detected and corrected.       Jomarie Longs, MD 09/19/2019, 12:44 PM

## 2019-09-24 ENCOUNTER — Other Ambulatory Visit: Payer: Self-pay

## 2019-09-24 ENCOUNTER — Other Ambulatory Visit: Payer: Medicare Other

## 2019-09-24 DIAGNOSIS — Z125 Encounter for screening for malignant neoplasm of prostate: Secondary | ICD-10-CM

## 2019-09-24 DIAGNOSIS — Z79899 Other long term (current) drug therapy: Secondary | ICD-10-CM

## 2019-09-24 DIAGNOSIS — Z0000 Encounter for general adult medical examination without abnormal findings: Secondary | ICD-10-CM

## 2019-09-24 DIAGNOSIS — E559 Vitamin D deficiency, unspecified: Secondary | ICD-10-CM

## 2019-09-24 DIAGNOSIS — E039 Hypothyroidism, unspecified: Secondary | ICD-10-CM

## 2019-09-24 DIAGNOSIS — I10 Essential (primary) hypertension: Secondary | ICD-10-CM

## 2019-09-24 DIAGNOSIS — M1A9XX1 Chronic gout, unspecified, with tophus (tophi): Secondary | ICD-10-CM

## 2019-09-24 DIAGNOSIS — F259 Schizoaffective disorder, unspecified: Secondary | ICD-10-CM

## 2019-09-25 LAB — HEMOGLOBIN A1C
Hgb A1c MFr Bld: 4.8 % of total Hgb (ref <5.7)
Mean Plasma Glucose: 91 (calc)
eAG (mmol/L): 5 (calc)

## 2019-09-25 LAB — COMPLETE METABOLIC PANEL WITH GFR
AG Ratio: 2 (calc) (ref 1.0–2.5)
ALT: 15 U/L (ref 9–46)
AST: 17 U/L (ref 10–35)
Albumin: 4.2 g/dL (ref 3.6–5.1)
Alkaline phosphatase (APISO): 107 U/L (ref 35–144)
BUN: 24 mg/dL (ref 7–25)
CO2: 25 mmol/L (ref 20–32)
Calcium: 9.5 mg/dL (ref 8.6–10.3)
Chloride: 108 mmol/L (ref 98–110)
Creat: 1.09 mg/dL (ref 0.70–1.33)
GFR, Est African American: 89 mL/min/{1.73_m2} (ref >=60)
GFR, Est Non African American: 77 mL/min/{1.73_m2} (ref >=60)
Globulin: 2.1 g/dL (calc) (ref 1.9–3.7)
Glucose, Bld: 81 mg/dL (ref 65–99)
Potassium: 3.9 mmol/L (ref 3.5–5.3)
Sodium: 141 mmol/L (ref 135–146)
Total Bilirubin: 0.4 mg/dL (ref 0.2–1.2)
Total Protein: 6.3 g/dL (ref 6.1–8.1)

## 2019-09-25 LAB — PSA: PSA: 1 ng/mL (ref <=4.0)

## 2019-09-25 LAB — T4, FREE: Free T4: 1.2 ng/dL (ref 0.8–1.8)

## 2019-09-25 LAB — CBC WITH DIFFERENTIAL/PLATELET
Absolute Monocytes: 405 cells/uL (ref 200–950)
Basophils Absolute: 51 cells/uL (ref 0–200)
Basophils Relative: 1.1 %
Eosinophils Absolute: 253 cells/uL (ref 15–500)
Eosinophils Relative: 5.5 %
HCT: 45.8 % (ref 38.5–50.0)
Hemoglobin: 15 g/dL (ref 13.2–17.1)
Lymphs Abs: 1651 cells/uL (ref 850–3900)
MCH: 29.2 pg (ref 27.0–33.0)
MCHC: 32.8 g/dL (ref 32.0–36.0)
MCV: 89.1 fL (ref 80.0–100.0)
MPV: 11.9 fL (ref 7.5–12.5)
Monocytes Relative: 8.8 %
Neutro Abs: 2240 cells/uL (ref 1500–7800)
Neutrophils Relative %: 48.7 %
Platelets: 137 10*3/uL — ABNORMAL LOW (ref 140–400)
RBC: 5.14 10*6/uL (ref 4.20–5.80)
RDW: 13.4 % (ref 11.0–15.0)
Total Lymphocyte: 35.9 %
WBC: 4.6 10*3/uL (ref 3.8–10.8)

## 2019-09-25 LAB — LIPID PANEL
Cholesterol: 152 mg/dL (ref <200)
HDL: 45 mg/dL (ref >=40)
LDL Cholesterol (Calc): 89 mg/dL (calc)
Non-HDL Cholesterol (Calc): 107 mg/dL (calc) (ref <130)
Total CHOL/HDL Ratio: 3.4 (calc) (ref <5.0)
Triglycerides: 85 mg/dL (ref <150)

## 2019-09-25 LAB — TSH: TSH: 6.62 mIU/L — ABNORMAL HIGH (ref 0.40–4.50)

## 2019-09-25 LAB — URIC ACID: Uric Acid, Serum: 3.2 mg/dL — ABNORMAL LOW (ref 4.0–8.0)

## 2019-09-25 LAB — VITAMIN D 25 HYDROXY (VIT D DEFICIENCY, FRACTURES): Vit D, 25-Hydroxy: 39 ng/mL (ref 30–100)

## 2019-10-01 ENCOUNTER — Encounter: Payer: Self-pay | Admitting: Family Medicine

## 2019-10-10 ENCOUNTER — Encounter: Payer: Self-pay | Admitting: Family Medicine

## 2019-10-10 ENCOUNTER — Other Ambulatory Visit: Payer: Self-pay

## 2019-10-10 ENCOUNTER — Ambulatory Visit (INDEPENDENT_AMBULATORY_CARE_PROVIDER_SITE_OTHER): Payer: Medicare Other | Admitting: Family Medicine

## 2019-10-10 VITALS — BP 145/103 | HR 61 | Temp 98.0°F | Resp 16 | Ht 65.5 in | Wt 149.0 lb

## 2019-10-10 DIAGNOSIS — G2119 Other drug induced secondary parkinsonism: Secondary | ICD-10-CM

## 2019-10-10 DIAGNOSIS — F251 Schizoaffective disorder, depressive type: Secondary | ICD-10-CM

## 2019-10-10 DIAGNOSIS — S72091S Other fracture of head and neck of right femur, sequela: Secondary | ICD-10-CM

## 2019-10-10 DIAGNOSIS — M1A9XX1 Chronic gout, unspecified, with tophus (tophi): Secondary | ICD-10-CM | POA: Diagnosis not present

## 2019-10-10 DIAGNOSIS — F259 Schizoaffective disorder, unspecified: Secondary | ICD-10-CM

## 2019-10-10 DIAGNOSIS — I872 Venous insufficiency (chronic) (peripheral): Secondary | ICD-10-CM | POA: Insufficient documentation

## 2019-10-10 DIAGNOSIS — R6250 Unspecified lack of expected normal physiological development in childhood: Secondary | ICD-10-CM

## 2019-10-10 DIAGNOSIS — Z23 Encounter for immunization: Secondary | ICD-10-CM

## 2019-10-10 DIAGNOSIS — E039 Hypothyroidism, unspecified: Secondary | ICD-10-CM

## 2019-10-10 DIAGNOSIS — R2689 Other abnormalities of gait and mobility: Secondary | ICD-10-CM

## 2019-10-10 DIAGNOSIS — F70 Mild intellectual disabilities: Secondary | ICD-10-CM

## 2019-10-10 DIAGNOSIS — Z0000 Encounter for general adult medical examination without abnormal findings: Secondary | ICD-10-CM

## 2019-10-10 MED ORDER — ALLOPURINOL 100 MG PO TABS: 100.0000 mg | ORAL_TABLET | Freq: Every day | ORAL | 3 refills | Status: DC

## 2019-10-10 NOTE — Patient Instructions (Addendum)
Thank you for coming to the office today.  Referral to [facility] - stay tuned for them to call and schedule, can request specific person at that time.  Monitor lower leg swelling and discoloration, if remains stable or improves, then that is good it is likely venous stasis fluid pooling circulation issue, if worse or pain or ulcer or worse red - call and we can refer to Vascular doctor  Reduced dose Allopurinol from 300 down to 100mg  - new rx sent, Uric acid was low, if gout flare again we can increase the dose again if need.  DUE for FASTING BLOOD WORK (no food or drink after midnight before the lab appointment, only water or coffee without cream/sugar on the morning of)  SCHEDULE "Lab Only" visit in the morning at the clinic for lab draw in 6 MONTHS   - Make sure Lab Only appointment is at about 1 week before your next appointment, so that results will be available  For Lab Results, once available within 2-3 days of blood draw, you can can log in to MyChart online to view your results and a brief explanation. Also, we can discuss results at next follow-up visit.   Please schedule a Follow-up Appointment to: Return in about 6 months (around 04/09/2020) for 6 month follow-up Gout, TSH Thyroid results.  If you have any other questions or concerns, please feel free to call the office or send a message through [place]. You may also schedule an earlier appointment if necessary.  Additionally, you may be receiving a survey about your experience at our office within a few days to 1 week by e-mail or mail. We value your feedback.  Nobie Putnam, DO [place]

## 2019-10-10 NOTE — Progress Notes (Signed)
Subjective:    Patient ID: Andres [name], male    DOB: 11-Dec-1966, 53 y.o.   MRN: [id number]  Andres [name] is a 53 y.o. male presenting on 10/10/2019 for Annual Exam  History provided primarily by Tonette Bihari, sister, primary caregiver.   HPI   Here for Annual Physical and Lab Review.  FOLLOW-UP Hypothyroidism/ Weight Loss Last result TSH slightly elevated to 6, but normal Free T4. Continues on Levothyroxine , doing well without problems. Recent weight trend slightly down 10 lbs in 3-4 months. Overall has been steady with some prior history fluctuation. Good appetite, and regular diet. Limits sweets - Denies any new complaints, abnormal temperature changes, nausea vomiting, fever chills sweats  Loss of Balance / History of R hip fracture s/p surgery Previously at SNF, now at home, had PT OT. Improving, using walker. Still has issues with balance, requesting [facility] PT, previously helpful with his father and they would be able to help him with balance. Using walker. Making progress  Gout, chronic On Allopurinol  daily, tolerating well. Last result Uric Acid 3.2 No recent flares.  CHRONIC HTN: Reports doing well, normal BP Current Meds - no regular meds   Denies CP, dyspnea, HA, edema, dizziness / lightheadedness  History of Venous Stasis Dermatitis Using compression stockings. With edema and discoloration. Denies pain or redness or active ulceration  Schizoaffective Disorder / Drug Induced Parkinsons Followed by Psychiatry Managed on current medications.   Health Maintenance:  Last colonoscopy done by [facility] 06/24/15, do not have external result available at this time, family does not recall any problem identified they believe there was no polyp found   Depression screen [facility] 2/9 10/10/2019 02/19/2019 07/07/2018  Decreased Interest 0 0 1  Down, Depressed, Hopeless 0 0 1  PHQ - 2 Score 0 0 2  Altered sleeping - - 2  Tired,  decreased energy - - 2  Change in appetite - - 0  Feeling bad or failure about yourself  - - 1  Trouble concentrating - - 2  Moving slowly or fidgety/restless - - 1  Suicidal thoughts - - 0  PHQ-9 Score - - 10  Difficult doing work/chores - - Somewhat difficult    Past Medical History:  Diagnosis Date  . Allergy    Past Surgical History:  Procedure Laterality Date  . ANKLE SURGERY    . COLON SURGERY    . HERNIA REPAIR    . INTRAMEDULLARY (IM) NAIL INTERTROCHANTERIC Right 06/18/2019   Procedure: INTRAMEDULLARY (IM) NAIL INTERTROCHANTRIC;  Surgeon: Lyndle Herrlich, MD;  Location: [facility] ORS;  Service: Orthopedics;  Laterality: Right;  . TONSILLECTOMY     Social History   Socioeconomic History  . Marital status: Single    Spouse name: Not on file  . Number of children: 0  . Years of education: [employer]  . Highest education level: High school graduate  Occupational History    Comment: disabled  Social Needs  . Financial resource strain: Not hard at all  . Food insecurity    Worry: Never true    Inability: Never true  . Transportation needs    Medical: Yes    Non-medical: Yes  Tobacco Use  . Smoking status: Never Smoker  . Smokeless tobacco: Never Used  Substance and Sexual Activity  . Alcohol use: No    Frequency: Never  . Drug use: No  . Sexual activity: Not Currently  Lifestyle  . Physical activity  Days per week: 0 days    Minutes per session: 0 min  . Stress: Not at all  Relationships  . Social connections    Talks on phone: More than three times a week    Gets together: More than three times a week    Attends religious service: Never    Active member of club or organization: No    Attends meetings of clubs or organizations: Never    Relationship status: Never married  . Intimate partner violence    Fear of current or ex partner: No    Emotionally abused: No    Physically abused: No    Forced sexual activity: No  Other Topics Concern  . Not on  file  Social History Narrative   Lives with parents, mental handicap, uses walker   Family History  Problem Relation Age of Onset  . Diabetes Mother   . Stroke Mother   . Cancer Father        colon  . Colon cancer Father   . Colon cancer Other   . Mental illness Neg Hx    Current Outpatient Medications on File Prior to Visit  Medication Sig  . cholecalciferol (VITAMIN D) 1000 units tablet Take 1,000 Units by mouth daily.  . citalopram (CELEXA) 10 MG tablet Take 1 tablet (10 mg total) by mouth daily.  . fexofenadine (ALLEGRA) 180 MG tablet Take 180 mg by mouth.  . INGREZZA 80 MG CAPS TAKE 1 CAPSULE BY MOUTH AT BEDTIME  . levothyroxine (SYNTHROID) 25 MCG tablet TAKE 1 TABLET BY MOUTH DAILY BEFORE BREAKFAST. ON EMPTY STOMACH, NOT TO TAKE WITH OTHER MEDICINES  . propranolol (INDERAL) 10 MG tablet Take 1 tablet (10 mg total) by mouth 2 (two) times daily as needed. For severe anxiety/agitation  . QUEtiapine (SEROQUEL) 25 MG tablet Take 0.5 tablets (12.5 mg total) by mouth daily as needed. For severe anxiety/agitation  . QUEtiapine (SEROQUEL) 25 MG tablet Take 1 tablet (25 mg total) by mouth at bedtime.  . senna-docusate (SENOKOT-S) 8.6-50 MG tablet Take 1 tablet by mouth 2 (two) times daily.  Marland Kitchen thiamine (VITAMIN B-1) 100 MG tablet Take 100 mg by mouth daily.  . traZODone (DESYREL) 50 MG tablet TAKE 1/2 TABLET(25 MG) BY MOUTH AT BEDTIME AS NEEDED FOR SLEEP  . enoxaparin (LOVENOX) 40 MG/0.4ML injection Inject 0.4 mLs (40 mg total) into the skin daily for 20 days.   No current facility-administered medications on file prior to visit.     Review of Systems  Constitutional: Positive for unexpected weight change. Negative for activity change, appetite change, chills, diaphoresis, fatigue and fever.  HENT: Negative for congestion and hearing loss.   Eyes: Negative for visual disturbance.  Respiratory: Negative for apnea, cough, chest tightness, shortness of breath and wheezing.    Cardiovascular: Negative for chest pain, palpitations and leg swelling.  Gastrointestinal: Negative for abdominal pain, anal bleeding, blood in stool, constipation, diarrhea, nausea and vomiting.  Endocrine: Negative for cold intolerance.  Genitourinary: Negative for difficulty urinating, dysuria, frequency and hematuria.  Musculoskeletal: Negative for arthralgias, back pain and neck pain.  Skin: Negative for rash.  Allergic/Immunologic: Negative for environmental allergies.  Neurological: Negative for dizziness, weakness, light-headedness, numbness and headaches.  Hematological: Negative for adenopathy.  Psychiatric/Behavioral: Negative for behavioral problems, dysphoric mood and sleep disturbance. The patient is not nervous/anxious.    Per HPI unless specifically indicated above     Objective:    BP (!) 145/103   Pulse 61   Temp  98 F (36.7 C) (Oral)   Resp 16   Ht 5' 5.5" (1.664 m)   Wt 149 lb (67.6 kg)   BMI 24.42 kg/m   Wt Readings from Last 3 Encounters:  10/10/19 149 lb (67.6 kg)  06/17/19 160 lb (72.6 kg)  02/19/19 160 lb 1.6 oz (72.6 kg)    Physical Exam Vitals signs and nursing note reviewed.  Constitutional:      General: He is not in acute distress.    Appearance: He is well-developed. He is not diaphoretic.     Comments: Well-appearing, comfortable, cooperative  HENT:     Head: Normocephalic and atraumatic.  Eyes:     General:        Right eye: No discharge.        Left eye: No discharge.     Conjunctiva/sclera: Conjunctivae normal.  Cardiovascular:     Rate and Rhythm: Normal rate.     Pulses: Normal pulses.     Heart sounds: No murmur.  Pulmonary:     Effort: Pulmonary effort is normal. No respiratory distress.     Breath sounds: Normal breath sounds. No wheezing or rales.  Abdominal:     General: Bowel sounds are normal. There is no distension.     Palpations: Abdomen is soft.  Skin:    General: Skin is warm and dry.     Findings: No erythema or  rash.  Neurological:     Mental Status: He is alert and oriented to person, place, and time.  Psychiatric:        Behavior: Behavior normal.     Comments: Well groomed, good eye contact. Increased frequency of speech with normal thoughts, occasional interrupting and some pressured speech. Appears to be at baseline with some cognitive delay       Results for orders placed or performed in visit on 09/24/19  Uric acid  Result Value Ref Range   Uric Acid, Serum 3.2 (L) 4.0 - 8.0 mg/dL  T4, free  Result Value Ref Range   Free T4 1.2 0.8 - 1.8 ng/dL  TSH  Result Value Ref Range   TSH 6.62 (H) 0.40 - 4.50 mIU/L  VITAMIN D 25 Hydroxy (Vit-D Deficiency, Fractures)  Result Value Ref Range   Vit D, 25-Hydroxy 39 30 - 100 ng/mL  PSA  Result Value Ref Range   PSA 1.0 < OR = 4.0 ng/mL  Lipid panel  Result Value Ref Range   Cholesterol 152 <200 mg/dL   HDL 45 > OR = 40 mg/dL   Triglycerides 85 <161<150 mg/dL   LDL Cholesterol (Calc) 89 mg/dL (calc)   Total CHOL/HDL Ratio 3.4 <5.0 (calc)   Non-HDL Cholesterol (Calc) 107 <130 mg/dL (calc)  COMPLETE METABOLIC PANEL WITH GFR  Result Value Ref Range   Glucose, Bld 81 65 - 99 mg/dL   BUN 24 7 - 25 mg/dL   Creat 0.961.09 0.450.70 - 4.091.33 mg/dL   GFR, Est Non African American 77 > OR = 60 mL/min/1.1773m2   GFR, Est African American 89 > OR = 60 mL/min/1.6773m2   BUN/Creatinine Ratio NOT APPLICABLE 6 - 22 (calc)   Sodium 141 135 - 146 mmol/L   Potassium 3.9 3.5 - 5.3 mmol/L   Chloride 108 98 - 110 mmol/L   CO2 25 20 - 32 mmol/L   Calcium 9.5 8.6 - 10.3 mg/dL   Total Protein 6.3 6.1 - 8.1 g/dL   Albumin 4.2 3.6 - 5.1 g/dL   Globulin 2.1 1.9 -  3.7 g/dL (calc)   AG Ratio 2.0 1.0 - 2.5 (calc)   Total Bilirubin 0.4 0.2 - 1.2 mg/dL   Alkaline phosphatase (APISO) 107 35 - 144 U/L   AST 17 10 - 35 U/L   ALT 15 9 - 46 U/L  CBC with Differential/Platelet  Result Value Ref Range   WBC 4.6 3.8 - 10.8 Thousand/uL   RBC 5.14 4.20 - 5.80 Million/uL   Hemoglobin  15.0 13.2 - 17.1 g/dL   HCT 45.8 38.5 - 50.0 %   MCV 89.1 80.0 - 100.0 fL   MCH 29.2 27.0 - 33.0 pg   MCHC 32.8 32.0 - 36.0 g/dL   RDW 13.4 11.0 - 15.0 %   Platelets 137 (L) 140 - 400 Thousand/uL   MPV 11.9 7.5 - 12.5 fL   Neutro Abs 2,240 1,500 - 7,800 cells/uL   Lymphs Abs 1,651 850 - 3,900 cells/uL   Absolute Monocytes 405 200 - 950 cells/uL   Eosinophils Absolute 253 15 - 500 cells/uL   Basophils Absolute 51 0 - 200 cells/uL   Neutrophils Relative % 48.7 %   Total Lymphocyte 35.9 %   Monocytes Relative 8.8 %   Eosinophils Relative 5.5 %   Basophils Relative 1.1 %  Hemoglobin A1c  Result Value Ref Range   Hgb A1c MFr Bld 4.8 <5.7 % of total Hgb   Mean Plasma Glucose 91 (calc)   eAG (mmol/L) 5.0 (calc)      Assessment & Plan:   Problem List Items Addressed This Visit    Chronic stasis dermatitis   Chronic tophaceous gout   Relevant Medications   allopurinol (ZYLOPRIM) 100 MG tablet   Development delay   Drug-induced Parkinsonism (HCC)   Hypothyroidism   Mild intellectual disability   Schizoaffective disorder (HCC)   Schizoaffective disorder, depressive type (HCC)    Other Visit Diagnoses    Annual physical exam    -  Primary      Updated Health Maintenance information Reviewed recent lab results with patient Encouraged improvement to lifestyle with diet and exercise Goal maintain weight improve appetite  #Balance/Hip fracture Referral to [facility] PT as requested.  #Hypothyroidism Mild elevated TSH 6 but clinically asymptomatic Continue current levo 83mcg Recheck 6 months  #Gout No recent flares. Uric acid 3.2, on allopurinol 300 Advise REDUCE dose to Allopurinol 100mg  daily, new rx sent Re-check uric acid 6 months  #Schizoaffective Followed by Psychiatry Continue current med management  Meds ordered this encounter  Medications  . allopurinol (ZYLOPRIM) 100 MG tablet    Sig: Take 1 tablet (100 mg total) by mouth daily.    Dispense:  90  tablet    Refill:  3    Dose reduced from 300 to 100mg    Orders Placed This Encounter  Procedures  . Flu Vaccine QUAD 6+ mos PF IM (Fluarix Quad PF)  . Ambulatory referral to Home Health    Referral Priority:   Routine    Referral Type:   Home Health Care    Referral Reason:   Specialty Services Required    Requested Specialty:   [place]    Number of Visits Requested:   1    Follow up plan: Return in about 6 months (around 04/09/2020) for 6 month follow-up Gout, TSH Thyroid results.   Follow-up labs TSH, Free T4, Uric acid 6 months (03/2020)  Nobie Putnam, DO [facility] 10/10/2019, 1:59 PM

## 2019-10-11 ENCOUNTER — Other Ambulatory Visit: Payer: Self-pay | Admitting: Family Medicine

## 2019-10-11 DIAGNOSIS — M1A9XX1 Chronic gout, unspecified, with tophus (tophi): Secondary | ICD-10-CM

## 2019-10-11 DIAGNOSIS — E039 Hypothyroidism, unspecified: Secondary | ICD-10-CM

## 2019-10-29 ENCOUNTER — Ambulatory Visit (INDEPENDENT_AMBULATORY_CARE_PROVIDER_SITE_OTHER): Payer: Medicare Other | Admitting: Psychiatry

## 2019-10-29 ENCOUNTER — Other Ambulatory Visit: Payer: Self-pay

## 2019-10-29 ENCOUNTER — Encounter: Payer: Self-pay | Admitting: Psychiatry

## 2019-10-29 DIAGNOSIS — G2401 Drug induced subacute dyskinesia: Secondary | ICD-10-CM

## 2019-10-29 DIAGNOSIS — F70 Mild intellectual disabilities: Secondary | ICD-10-CM

## 2019-10-29 DIAGNOSIS — F251 Schizoaffective disorder, depressive type: Secondary | ICD-10-CM | POA: Diagnosis not present

## 2019-10-29 NOTE — Progress Notes (Signed)
Virtual Visit via Video Note  I connected with Andres [name] on 10/29/19 at 11:00 AM EST by a video enabled telemedicine application and verified that I am speaking with the correct person using two identifiers.   I discussed the limitations of evaluation and management by telemedicine and the availability of in person appointments. The patient expressed understanding and agreed to proceed.   I discussed the assessment and treatment plan with the patient. The patient was provided an opportunity to ask questions and all were answered. The patient agreed with the plan and demonstrated an understanding of the instructions.   The patient was advised to call back or seek an in-person evaluation if the symptoms worsen or if the condition fails to improve as anticipated.   BH MD OP Progress Note  10/29/2019 3:31 PM Andres [name]  MRN:  [id number]  Chief Complaint:  Chief Complaint    Follow-up     HPI: Andres [name] is a 53 year old Caucasian male who has a history of schizoaffective disorder, TD, hypothyroidism, mild intellectual disability was evaluated by telemedicine today.  Patient's sister-Andres [name], provided collateral information.  Per Lupita Leashonna patient is currently making progress.  His mood seems to be stable.  He does have some sleep problems however he does have trazodone available which can be used at 50 mg as needed which he has not used yet.  He continues to use only half tablet as needed.  He is currently getting physical therapy again so he can walk better.  He continues to use a walker after his fracture.  Patient today appeared to be alert, oriented to person place situation.  He was able to answer questions appropriately.  Patient describes his mood as fair.  He did not seem to be preoccupied with any delusions, did not appear to have any hallucinations.  He reports sleep is restless likely due to to his leg injury and having to sleep in certain positions.  Discussed increasing the  trazodone to a  whole tablet of 50 mg as needed.  Patient reports his tremors of bilateral upper extremities are better.  He continues to take his medications as prescribed.  Patient denies any other concerns today. Visit Diagnosis:    ICD-10-CM   1. Schizoaffective disorder, depressive type (HCC)  F25.1   2. Tardive dyskinesia  G24.01   3. Mild intellectual disability  F70     Past Psychiatric History: I have reviewed past psychiatric history from my progress note on 03/29/2018  Past Medical History:  Past Medical History:  Diagnosis Date  . Allergy     Past Surgical History:  Procedure Laterality Date  . ANKLE SURGERY    . COLON SURGERY    . HERNIA REPAIR    . INTRAMEDULLARY (IM) NAIL INTERTROCHANTERIC Right 06/18/2019   Procedure: INTRAMEDULLARY (IM) NAIL INTERTROCHANTRIC;  Surgeon: [name], [name], MD;  Location: [facility] ORS;  Service: Orthopedics;  Laterality: Right;  . TONSILLECTOMY      Family Psychiatric History: I have reviewed family psychiatric history from my progress note on 03/29/2018  Family History:  Family History  Problem Relation Age of Onset  . Diabetes Mother   . Stroke Mother   . Cancer Father        colon  . Colon cancer Father   . Colon cancer Other   . Mental illness Neg Hx     Social History: I have reviewed social history from my progress note on 03/29/2018 Social History   Socioeconomic History  . Marital  status: Single    Spouse name: Not on file  . Number of children: 0  . Years of education: [employer]  . Highest education level: High school graduate  Occupational History    Comment: disabled  Social Needs  . Financial resource strain: Not hard at all  . Food insecurity    Worry: Never true    Inability: Never true  . Transportation needs    Medical: Yes    Non-medical: Yes  Tobacco Use  . Smoking status: Never Smoker  . Smokeless tobacco: Never Used  Substance and Sexual Activity  . Alcohol use: No    Frequency: Never  . Drug  use: No  . Sexual activity: Not Currently  Lifestyle  . Physical activity    Days per week: 0 days    Minutes per session: 0 min  . Stress: Not at all  Relationships  . Social connections    Talks on phone: More than three times a week    Gets together: More than three times a week    Attends religious service: Never    Active member of club or organization: No    Attends meetings of clubs or organizations: Never    Relationship status: Never married  Other Topics Concern  . Not on file  Social History Narrative   Lives with parents, mental handicap, uses walker    Allergies:  Allergies  Allergen Reactions  . Prednisone Other (See Comments)    Did not like the way it made him feel   . Lamotrigine Rash    Metabolic Disorder Labs: Lab Results  Component Value Date   HGBA1C 4.8 09/24/2019   MPG 91 09/24/2019   MPG 91 10/05/2018   No results found for: PROLACTIN Lab Results  Component Value Date   CHOL 152 09/24/2019   TRIG 85 09/24/2019   HDL 45 09/24/2019   CHOLHDL 3.4 09/24/2019   LDLCALC 89 09/24/2019   Lab Results  Component Value Date   TSH 6.62 (H) 09/24/2019   TSH 4.28 02/06/2019    Therapeutic Level Labs: No results found for: LITHIUM No results found for: VALPROATE No components found for:  CBMZ  Current Medications: Current Outpatient Medications  Medication Sig Dispense Refill  . allopurinol (ZYLOPRIM) 100 MG tablet Take 1 tablet (100 mg total) by mouth daily. 90 tablet 3  . cholecalciferol (VITAMIN D) 1000 units tablet Take 1,000 Units by mouth daily.    . citalopram (CELEXA) 10 MG tablet Take 1 tablet (10 mg total) by mouth daily. 90 tablet 2  . enoxaparin (LOVENOX) 40 MG/0.4ML injection Inject 0.4 mLs (40 mg total) into the skin daily for 20 days. 8 mL 0  . fexofenadine (ALLEGRA) 180 MG tablet Take 180 mg by mouth.    . INGREZZA 80 MG CAPS TAKE 1 CAPSULE BY MOUTH AT BEDTIME 30 capsule 7  . levothyroxine (SYNTHROID) 25 MCG tablet TAKE 1  TABLET BY MOUTH DAILY BEFORE BREAKFAST. ON EMPTY STOMACH, NOT TO TAKE WITH OTHER MEDICINES 90 tablet 1  . propranolol (INDERAL) 10 MG tablet Take 1 tablet (10 mg total) by mouth 2 (two) times daily as needed. For severe anxiety/agitation 180 tablet 2  . QUEtiapine (SEROQUEL) 25 MG tablet Take 0.5 tablets (12.5 mg total) by mouth daily as needed. For severe anxiety/agitation 15 tablet 2  . QUEtiapine (SEROQUEL) 25 MG tablet Take 1 tablet (25 mg total) by mouth at bedtime. 90 tablet 1  . senna-docusate (SENOKOT-S) 8.6-50 MG tablet Take 1  tablet by mouth 2 (two) times daily. 30 tablet 0  . thiamine (VITAMIN B-1) 100 MG tablet Take 100 mg by mouth daily.    . traZODone (DESYREL) 50 MG tablet TAKE 1/2 TABLET(25 MG) BY MOUTH AT BEDTIME AS NEEDED FOR SLEEP 45 tablet 3   No current facility-administered medications for this visit.      Musculoskeletal: Strength & Muscle Tone: UTA Gait & Station: Observed as seated Patient leans: N/A  Psychiatric Specialty Exam: Review of Systems  Neurological: Positive for tremors.  Psychiatric/Behavioral: Negative for depression, hallucinations, substance abuse and suicidal ideas. The patient is not nervous/anxious and does not have insomnia.   All other systems reviewed and are negative.   There were no vitals taken for this visit.There is no height or weight on file to calculate BMI.  General Appearance: Casual  Eye Contact:  Fair  Speech:  Clear and Coherent  Volume:  Normal  Mood:  Euthymic  Affect:  Appropriate  Thought Process:  Goal Directed and Descriptions of Associations: Intact  Orientation:  Other:  place situation, time  Thought Content: Logical   Suicidal Thoughts:  No  Homicidal Thoughts:  No  Memory:  Immediate;   Fair Recent;   Fair Remote;   Fair  Judgement:  Fair  Insight:  Fair  Psychomotor Activity:  Tremor  Concentration:  Concentration: Fair and Attention Span: Fair  Recall:  [employer] of Knowledge: limited  Language: Fair   Akathisia:  No  Handed:  Right  AIMS (if indicated):does have TD - making progress  Assets:  Communication Skills Desire for Improvement Housing Social Support  ADL's:  Intact  Cognition: baseline  Sleep:  restless   Screenings: AIMS     Office Visit from 01/11/2019 in [place] Office Visit from 11/07/2018 in [place] Office Visit from 09/07/2018 in [place] Office Visit from 05/31/2018 in [place] Office Visit from 05/16/2018 in [place] Total Score  5  8  8  8  9     GAD-7     Office Visit from 07/07/2018 in [facility]  Total GAD-7 Score  15    PHQ2-9     Office Visit from 10/10/2019 in [facility] Office Visit from 02/19/2019 in [facility] Office Visit from 07/07/2018 in [place]  PHQ-2 Total Score  0  0  2  PHQ-9 Total Score  -  -  10       Assessment and Plan: Armany is a 53 year old Caucasian male, has a history of schizoaffective disorder, mild ID, TD, hypothyroidism was evaluated by telemedicine today.  Patient with psychosocial stressors of recent fracture as well as the pandemic.  He however is currently making progress.  He does have good social support system.  Plan as noted below.  Plan Schizoaffective disorder-stable Seroquel 25 mg p.o. nightly Trazodone 25 mg -50 mg p.o. nightly as needed Celexa 10 mg p.o. daily Propranolol 10 mg p.o. twice daily as needed for severe agitation  TD-improving Ingrezza 80 mg p.o. daily  Collateral information was obtained from sister-Andres [name] as summarized above.  Follow-up in clinic in 2 months or sooner if needed.-December 22 at 10 AM  I have spent atleast 15 MINUTES NON  face to face with patient today. More than 50 % of the time was spent for psychoeducation and supportive psychotherapy and care  coordination. This note was generated  in part or whole with voice recognition software. Voice recognition is usually quite accurate but there are transcription errors that can and very often do occur. I apologize for any typographical errors that were not detected and corrected.      Jomarie Longs, MD 10/29/2019, 3:31 PM

## 2019-12-06 ENCOUNTER — Other Ambulatory Visit: Payer: Self-pay | Admitting: Family Medicine

## 2019-12-06 DIAGNOSIS — E039 Hypothyroidism, unspecified: Secondary | ICD-10-CM

## 2019-12-06 MED ORDER — EUTHYROX 25 MCG PO TABS: 25.0000 ug | ORAL_TABLET | Freq: Every day | ORAL | 3 refills | Status: DC

## 2019-12-18 ENCOUNTER — Ambulatory Visit: Payer: Medicare Other | Admitting: Psychiatry

## 2020-01-07 ENCOUNTER — Encounter: Payer: Self-pay | Admitting: Psychiatry

## 2020-01-07 ENCOUNTER — Ambulatory Visit (INDEPENDENT_AMBULATORY_CARE_PROVIDER_SITE_OTHER): Payer: Medicare Other | Admitting: Psychiatry

## 2020-01-07 ENCOUNTER — Other Ambulatory Visit: Payer: Self-pay

## 2020-01-07 DIAGNOSIS — F259 Schizoaffective disorder, unspecified: Secondary | ICD-10-CM

## 2020-01-07 DIAGNOSIS — G2401 Drug induced subacute dyskinesia: Secondary | ICD-10-CM | POA: Diagnosis not present

## 2020-01-07 DIAGNOSIS — F70 Mild intellectual disabilities: Secondary | ICD-10-CM

## 2020-01-07 MED ORDER — PROPRANOLOL HCL 10 MG PO TABS: 10.0000 mg | ORAL_TABLET | Freq: Two times a day (BID) | ORAL | 2 refills | Status: DC | PRN

## 2020-01-07 MED ORDER — TRAZODONE HCL 50 MG PO TABS: 50.0000 mg | ORAL_TABLET | Freq: Every evening | ORAL | 3 refills | Status: DC | PRN

## 2020-01-07 MED ORDER — INGREZZA 80 MG PO CAPS: 1.0000 | ORAL_CAPSULE | Freq: Every day | ORAL | 7 refills | Status: DC

## 2020-01-07 MED ORDER — QUETIAPINE FUMARATE 25 MG PO TABS: 25.0000 mg | ORAL_TABLET | Freq: Every day | ORAL | 2 refills | Status: DC

## 2020-01-07 NOTE — Progress Notes (Signed)
Virtual Visit via Video Note  I connected with Andres [name] on 01/07/20 at  9:30 AM EST by a video enabled telemedicine application and verified that I am speaking with the correct person using two identifiers.   I discussed the limitations of evaluation and management by telemedicine and the availability of in person appointments. The patient expressed understanding and agreed to proceed.     I discussed the assessment and treatment plan with the patient. The patient was provided an opportunity to ask questions and all were answered. The patient agreed with the plan and demonstrated an understanding of the instructions.   The patient was advised to call back or seek an in-person evaluation if the symptoms worsen or if the condition fails to improve as anticipated.  BH MD OP Progress Note  01/07/2020 9:59 AM Andres [name]  MRN:  [id number]  Chief Complaint:  Chief Complaint    Follow-up     HPI: Andres [name] is a 54 year old Caucasian male who has a history of schizoaffective disorder, TD, hypothyroidism, mild intellectual disability was evaluated by telemedicine today.  Patient's sister-Andres [name]-provided collateral information.  Per daughter patient does have some sleep problems on and off.  She reports that there has been days when he would wake up in the morning and was observed as '"grumpy."  Hence they increased the trazodone dosage to 50 mg which he has been taking along with the Seroquel.  That seems to have helped.  Patient today appears to be alert, oriented to person place time and situation.  He was able to answer all questions appropriately.  Patient denies any suicidality, homicidality or perceptual disturbances.  Patient however did appear to have some movements of his jaw during our conversation.  It is likely TD.  Patient denies any stiffness or rigidity or pain in his jaw.  His tremors of bilateral upper extremities have improved a lot on Ingrezza.  Patient is tolerating the  Ingrezza well and denies side effects.  Discussed with patient as well as Andres [name]'s sister to monitor his jaw movement problems closely.  Patient denies any other concerns today. Visit Diagnosis:    ICD-10-CM   1. Schizoaffective disorder, in remission (HCC)  F25.9 traZODone (DESYREL) 50 MG tablet    QUEtiapine (SEROQUEL) 25 MG tablet    propranolol (INDERAL) 10 MG tablet  2. Tardive dyskinesia  G24.01 Valbenazine Tosylate (INGREZZA) 80 MG CAPS  3. Mild intellectual disability  F70     Past Psychiatric History: I have reviewed past psychiatric history from my progress note on 03/29/2018.  Past Medical History:  Past Medical History:  Diagnosis Date  . Allergy     Past Surgical History:  Procedure Laterality Date  . ANKLE SURGERY    . COLON SURGERY    . HERNIA REPAIR    . INTRAMEDULLARY (IM) NAIL INTERTROCHANTERIC Right 06/18/2019   Procedure: INTRAMEDULLARY (IM) NAIL INTERTROCHANTRIC;  Surgeon: Lyndle Herrlich, MD;  Location: [facility] ORS;  Service: Orthopedics;  Laterality: Right;  . TONSILLECTOMY      Family Psychiatric History: Reviewed family psychiatric history from my progress note on 03/29/2018.  Family History:  Family History  Problem Relation Age of Onset  . Diabetes Mother   . Stroke Mother   . Cancer Father        colon  . Colon cancer Father   . Colon cancer Other   . Mental illness Neg Hx     Social History: Reviewed social history from my progress note on 03/29/2018. Social  History   Socioeconomic History  . Marital status: Single    Spouse name: Not on file  . Number of children: 0  . Years of education: [employer]  . Highest education level: High school graduate  Occupational History    Comment: disabled  Tobacco Use  . Smoking status: Never Smoker  . Smokeless tobacco: Never Used  Substance and Sexual Activity  . Alcohol use: No  . Drug use: No  . Sexual activity: Not Currently  Other Topics Concern  . Not on file  Social History Narrative    Lives with parents, mental handicap, uses walker   Social Determinants of Health   Financial Resource Strain:   . Difficulty of Paying Living Expenses: Not on file  Food Insecurity:   . Worried About Programme researcher, broadcasting/film/video in the Last Year: Not on file  . Ran Out of Food in the Last Year: Not on file  Transportation Needs:   . Lack of Transportation (Medical): Not on file  . Lack of Transportation (Non-Medical): Not on file  Physical Activity:   . Days of Exercise per Week: Not on file  . Minutes of Exercise per Session: Not on file  Stress:   . Feeling of Stress : Not on file  Social Connections:   . Frequency of Communication with Friends and Family: Not on file  . Frequency of Social Gatherings with Friends and Family: Not on file  . Attends Religious Services: Not on file  . Active Member of Clubs or Organizations: Not on file  . Attends Banker Meetings: Not on file  . Marital Status: Not on file    Allergies:  Allergies  Allergen Reactions  . Prednisone Other (See Comments)    Did not like the way it made him feel   . Lamotrigine Rash    Metabolic Disorder Labs: Lab Results  Component Value Date   HGBA1C 4.8 09/24/2019   MPG 91 09/24/2019   MPG 91 10/05/2018   No results found for: PROLACTIN Lab Results  Component Value Date   CHOL 152 09/24/2019   TRIG 85 09/24/2019   HDL 45 09/24/2019   CHOLHDL 3.4 09/24/2019   LDLCALC 89 09/24/2019   Lab Results  Component Value Date   TSH 6.62 (H) 09/24/2019   TSH 4.28 02/06/2019    Therapeutic Level Labs: No results found for: LITHIUM No results found for: VALPROATE No components found for:  CBMZ  Current Medications: Current Outpatient Medications  Medication Sig Dispense Refill  . allopurinol (ZYLOPRIM) 100 MG tablet Take 1 tablet (100 mg total) by mouth daily. 90 tablet 3  . cholecalciferol (VITAMIN D) 1000 units tablet Take 1,000 Units by mouth daily.    . citalopram (CELEXA) 10 MG tablet Take  1 tablet (10 mg total) by mouth daily. 90 tablet 2  . enoxaparin (LOVENOX) 40 MG/0.4ML injection Inject 0.4 mLs (40 mg total) into the skin daily for 20 days. 8 mL 0  . EUTHYROX 25 MCG tablet Take 1 tablet (25 mcg total) by mouth daily before breakfast. 90 tablet 3  . fexofenadine (ALLEGRA) 180 MG tablet Take 180 mg by mouth.    . propranolol (INDERAL) 10 MG tablet Take 1 tablet (10 mg total) by mouth 2 (two) times daily as needed. For severe anxiety/agitation 180 tablet 2  . QUEtiapine (SEROQUEL) 25 MG tablet Take 0.5 tablets (12.5 mg total) by mouth daily as needed. For severe anxiety/agitation 15 tablet 2  . QUEtiapine (SEROQUEL) 25  MG tablet Take 1 tablet (25 mg total) by mouth at bedtime. 90 tablet 2  . senna-docusate (SENOKOT-S) 8.6-50 MG tablet Take 1 tablet by mouth 2 (two) times daily. 30 tablet 0  . thiamine (VITAMIN B-1) 100 MG tablet Take 100 mg by mouth daily.    . traZODone (DESYREL) 50 MG tablet Take 1 tablet (50 mg total) by mouth at bedtime as needed for sleep. 90 tablet 3  . Valbenazine Tosylate (INGREZZA) 80 MG CAPS Take 1 capsule by mouth at bedtime. 30 capsule 7   No current facility-administered medications for this visit.     Musculoskeletal: Strength & Muscle Tone: UTA Gait & Station: normal Patient leans: N/A  Psychiatric Specialty Exam: Review of Systems  Psychiatric/Behavioral: Negative for agitation, behavioral problems, confusion, decreased concentration, dysphoric mood, hallucinations, self-injury, sleep disturbance and suicidal ideas. The patient is not nervous/anxious and is not hyperactive.   All other systems reviewed and are negative.   There were no vitals taken for this visit.There is no height or weight on file to calculate BMI.  General Appearance: Casual  Eye Contact:  Fair  Speech:  Clear and Coherent  Volume:  Normal  Mood:  Euthymic  Affect:  Appropriate  Thought Process:  Goal Directed and Descriptions of Associations: Intact  Orientation:   Other:  person, time, situation  Thought Content: Logical   Suicidal Thoughts:  No  Homicidal Thoughts:  No  Memory:  Immediate;   Fair Recent;   Fair Remote;   Fair  Judgement:  Fair  Insight:  Fair  Psychomotor Activity:  Normal  Concentration:  Concentration: Fair and Attention Span: Fair  Recall:  [employer] of Knowledge: Fair  Language: Fair  Akathisia:  No  Handed:  Right  AIMS (if indicated):Has TD - jaw movements observed during session   Assets:  Communication Skills Housing Social Support Transportation  ADL's:  Intact  Cognition:baseline  Sleep:  Fair   Screenings: AIMS     Office Visit from 01/11/2019 in [facility] Office Visit from 11/07/2018 in [facility] Office Visit from 09/07/2018 in [facility] Office Visit from 05/31/2018 in [facility] Office Visit from 05/16/2018 in [facility]  AIMS Total Score  5  8  8  8  9     GAD-7     Office Visit from 07/07/2018 in [facility]  Total GAD-7 Score  15    PHQ2-9     Office Visit from 10/10/2019 in [facility] Office Visit from 02/19/2019 in [facility] Office Visit from 07/07/2018 in [facility]  PHQ-2 Total Score  0  0  2  PHQ-9 Total Score  --  --  10       Assessment and Plan: Jarnell is a 53 year old Caucasian male, has a history of schizoaffective disorder, mild diabetes, GAD, hypothyroidism was evaluated by telemedicine today.  Patient with psychosocial stressors of current pandemic.  Patient however is currently making progress.  He continues to have good support system from his family.  Plan as noted below.  Plan schizoaffective disorder in remission Seroquel 25 mg p.o. nightly. Change trazodone to 50 mg p.o. nightly as needed-patient has been taking that dosage and is doing well. Celexa 10 mg p.o.  daily Propranolol 10 mg p.o. twice daily as needed for severe agitation.  TD-stable Ingrezza 80 mg p.o. daily. Patient does appear to have of his job during  our conversation-advised to monitor closely.  Collateral information was obtained from sister-daughter as summarized above.  Follow-up in clinic in 3 months or sooner if needed.  April 14 at 11 AM  I have spent atleast 20 minutes non face to face with patient today. More than 50 % of the time was spent for obtaining and to review and separately obtained history , ordering medications and test ,psychoeducation and supportive psychotherapy and care coordination,as well as documenting clinical information in electronic health record.This note was generated in part or whole with voice recognition software. Voice recognition is usually quite accurate but there are transcription errors that can and very often do occur. I apologize for any typographical errors that were not detected and corrected.       Ursula Alert, MD 01/07/2020, 9:59 AM

## 2020-02-05 ENCOUNTER — Telehealth: Payer: Self-pay

## 2020-02-05 NOTE — Telephone Encounter (Signed)
went online and submitted the prior auth- pending review.  

## 2020-02-05 NOTE — Telephone Encounter (Signed)
faxed and confirmed approval notice.  

## 2020-02-05 NOTE — Telephone Encounter (Signed)
according to the patient assistant rep she states that a prior authorization needed to be done 1st on the ingrezza. before the patient assistant can be approved.

## 2020-02-05 NOTE — Telephone Encounter (Signed)
received fax that prior auth was approved until 12-26-2020 ref # [id number]

## 2020-03-06 ENCOUNTER — Telehealth: Payer: Self-pay

## 2020-03-06 NOTE — Telephone Encounter (Signed)
Is it for Ingrezza ? He is not on Invega.

## 2020-03-06 NOTE — Telephone Encounter (Signed)
Toni Amend from [employer] called regarding the Patient Assistance Program for this patient. She stated that he does not qualify for patient assistance for Invega through them and since his PA was approved by [place], referrals can be handled by them. Courtney from [facility] is going to close out the referral. She wanted to inform us. Thank you.

## 2020-03-17 NOTE — Telephone Encounter (Signed)
Spoke with Marcelino Duster at [employer]. She is working with patient on getting a financial statement to submit for a grant to get his Ingrezza 80mg  covered for the year so he's being taken care of. Courtney from apparently was not aware of this or of the medication the patient is on or that his medication does not go through patient assistance. Thank you.

## 2020-03-17 NOTE — Telephone Encounter (Signed)
Is there anything I need to Do ?

## 2020-03-21 NOTE — Telephone Encounter (Signed)
I don't think so but if I found out that there is, I'll definitely let you know

## 2020-04-02 ENCOUNTER — Other Ambulatory Visit: Payer: Self-pay

## 2020-04-02 ENCOUNTER — Other Ambulatory Visit: Payer: Medicare Other

## 2020-04-02 DIAGNOSIS — E039 Hypothyroidism, unspecified: Secondary | ICD-10-CM

## 2020-04-02 DIAGNOSIS — M1A9XX1 Chronic gout, unspecified, with tophus (tophi): Secondary | ICD-10-CM

## 2020-04-03 LAB — URIC ACID: Uric Acid, Serum: 3.5 mg/dL — ABNORMAL LOW (ref 4.0–8.0)

## 2020-04-03 LAB — T4, FREE: Free T4: 1.3 ng/dL (ref 0.8–1.8)

## 2020-04-03 LAB — TSH: TSH: 4.92 mIU/L — ABNORMAL HIGH (ref 0.40–4.50)

## 2020-04-09 ENCOUNTER — Ambulatory Visit: Payer: Medicare Other | Admitting: Family Medicine

## 2020-04-09 ENCOUNTER — Encounter: Payer: Self-pay | Admitting: Family Medicine

## 2020-04-09 ENCOUNTER — Ambulatory Visit (INDEPENDENT_AMBULATORY_CARE_PROVIDER_SITE_OTHER): Payer: Medicare Other | Admitting: Psychiatry

## 2020-04-09 ENCOUNTER — Encounter: Payer: Self-pay | Admitting: Psychiatry

## 2020-04-09 ENCOUNTER — Other Ambulatory Visit: Payer: Self-pay

## 2020-04-09 ENCOUNTER — Other Ambulatory Visit: Payer: Self-pay | Admitting: Family Medicine

## 2020-04-09 VITALS — BP 116/67 | HR 49 | Temp 97.7°F | Resp 16 | Ht 65.5 in | Wt 156.0 lb

## 2020-04-09 DIAGNOSIS — Z125 Encounter for screening for malignant neoplasm of prostate: Secondary | ICD-10-CM

## 2020-04-09 DIAGNOSIS — G2401 Drug induced subacute dyskinesia: Secondary | ICD-10-CM

## 2020-04-09 DIAGNOSIS — F259 Schizoaffective disorder, unspecified: Secondary | ICD-10-CM

## 2020-04-09 DIAGNOSIS — E559 Vitamin D deficiency, unspecified: Secondary | ICD-10-CM

## 2020-04-09 DIAGNOSIS — R351 Nocturia: Secondary | ICD-10-CM

## 2020-04-09 DIAGNOSIS — M1A9XX1 Chronic gout, unspecified, with tophus (tophi): Secondary | ICD-10-CM

## 2020-04-09 DIAGNOSIS — F70 Mild intellectual disabilities: Secondary | ICD-10-CM | POA: Diagnosis not present

## 2020-04-09 DIAGNOSIS — I10 Essential (primary) hypertension: Secondary | ICD-10-CM

## 2020-04-09 DIAGNOSIS — F251 Schizoaffective disorder, depressive type: Secondary | ICD-10-CM | POA: Diagnosis not present

## 2020-04-09 DIAGNOSIS — G2119 Other drug induced secondary parkinsonism: Secondary | ICD-10-CM

## 2020-04-09 DIAGNOSIS — Z0000 Encounter for general adult medical examination without abnormal findings: Secondary | ICD-10-CM

## 2020-04-09 DIAGNOSIS — E039 Hypothyroidism, unspecified: Secondary | ICD-10-CM

## 2020-04-09 DIAGNOSIS — R7309 Other abnormal glucose: Secondary | ICD-10-CM

## 2020-04-09 DIAGNOSIS — Z79899 Other long term (current) drug therapy: Secondary | ICD-10-CM

## 2020-04-09 MED ORDER — TRAZODONE HCL 50 MG PO TABS: 75.0000 mg | ORAL_TABLET | Freq: Every evening | ORAL | 0 refills | Status: DC | PRN

## 2020-04-09 NOTE — Assessment & Plan Note (Signed)
Well controlled on prophylaxis Dose reduction Allopurinol 300 to 100 over past 6 months without flare Last uric acid 3.5 (03/2020) was 3.2 (09/2019 back on 300mg )  Plan Continue current Allopurinol 100mg  daily, discussed benefit risk, agree to keep on low dose to prevent gout Future if Uric acid remains very well controlled we can consider DC med but defer for now

## 2020-04-09 NOTE — Assessment & Plan Note (Signed)
Stable chronic problem Managed by Psychiatry On med management Had increased Trazodone in PM from 50 to 100mg  Has upcoming visit today virtual

## 2020-04-09 NOTE — Assessment & Plan Note (Signed)
Stable, controlled now normalized on levothyroxine low dose TSH down from >6 down to 4.9, without dose change. Normal Free T4 Likely metabolism more balanced, appetite improved, some normal wt gain  Plan Continue current Levothyroxine daily (Brand Euthyrox) - no dose change Re-check TSH Free T4 in 6 months at annual Follow-up

## 2020-04-09 NOTE — Patient Instructions (Addendum)
Thank you for coming to the office today.  Recommend COVID19 vaccine when you are ready.  ---------------------------------------------------------  1. TSH Thyroid Function Tests - Improved since last time. TSH down to 4.92 virtually normal. Previously was at 6.62. Goal is < 4.5 approximately. His Free T4 is 1.3 which is normal. - Keep on current dose of Levothyroxine daily. No change recommended at this time.  2. Uric Acid - 3.5, low result still which is excellent. 6 months ago was 3.2 on allopurinol 300mg , now we reduced allopurinol down to 100mg  and still in the 3 range. - I would keep at same dose Allopurinol 100mg  daily for no.  DUE for FASTING BLOOD WORK (no food or drink after midnight before the lab appointment, only water or coffee without cream/sugar on the morning of)  SCHEDULE "Lab Only" visit in the morning at the clinic for lab draw in 6 MONTHS   - Make sure Lab Only appointment is at about 1 week before your next appointment, so that results will be available  For Lab Results, once available within 2-3 days of blood draw, you can can log in to MyChart online to view your results and a brief explanation. Also, we can discuss results at next follow-up visit.   Please schedule a Follow-up Appointment to: Return in about 6 months (around 10/09/2020) for Annual Physical.  If you have any other questions or concerns, please feel free to call the office or send a message through MyChart. You may also schedule an earlier appointment if necessary.  Additionally, you may be receiving a survey about your experience at our office within a few days to 1 week by e-mail or mail. We value your feedback.  , DO [facility], 10/11/2020

## 2020-04-09 NOTE — Assessment & Plan Note (Signed)
Controlled HTN off medication currently Not checking BP regularly    Plan:  1. Remain off meds for now 2. Encourage improved lifestyle - low sodium diet, regular exercise 3. May try to monitor BP outside office, bring readings to next visit, if persistently >140/90 or new symptoms notify office sooner 

## 2020-04-09 NOTE — Progress Notes (Signed)
Provider Location : [facility] Patient Location : Sister's office -   Virtual Visit via Video Note  I connected with Kalijah Zeiss Crossman on 04/09/20 at 11:00 AM EDT by a video enabled telemedicine application and verified that I am speaking with the correct person using two identifiers.   I discussed the limitations of evaluation and management by telemedicine and the availability of in person appointments. The patient expressed understanding and agreed to proceed.  I discussed the assessment and treatment plan with the patient. The patient was provided an opportunity to ask questions and all were answered. The patient agreed with the plan and demonstrated an understanding of the instructions.   The patient was advised to call back or seek an in-person evaluation if the symptoms worsen or if the condition fails to improve as anticipated.   BH MD OP Progress Note  04/09/2020 11:59 AM DEMARRI ELIE  MRN:  [id number]  Chief Complaint:  Chief Complaint    Follow-up     HPI: Deston is a 54 year old Caucasian male who has a history of schizoaffective disorder, GAD, hypothyroidism, mild intellectual disability was evaluated by telemedicine today.  Patient's Sister [name]-provided collateral information.  Patient today reports he is currently doing well.  He does report sleep issues.  He is unable to elaborate on it.  He takes all his medications.  Denies side effects.  Patient appeared to be pleasant in session today.  He was able to answer all questions although in short phrases.  Per Sister [name]-patient is currently doing overall well.  She however reports patient recently confided in her about having some sexual feelings towards the opposite sex.  Per sister patient had some negative sexual feelings and is currently confused about the same.  Patient continues to ruminate about the same.  Per sister she wonders whether he needs to be in psychotherapy sessions so he can talk about this and get  some relief.  Sister reports she understands her brother and she currently is not concerned about any safety issues however wants him to get help.  Patient denies any suicidality, homicidality or perceptual disturbances.  Patient denies any other concerns today. Visit Diagnosis:    ICD-10-CM   1. Schizoaffective disorder, in remission (HCC)  F25.9 traZODone (DESYREL) 50 MG tablet  2. Tardive dyskinesia  G24.01   3. Mild intellectual disability  F70     Past Psychiatric History: I have reviewed past psychiatric history from my progress note on 03/29/2018  Past Medical History:  Past Medical History:  Diagnosis Date  . Allergy     Past Surgical History:  Procedure Laterality Date  . ANKLE SURGERY    . COLON SURGERY    . HERNIA REPAIR    . INTRAMEDULLARY (IM) NAIL INTERTROCHANTERIC Right 06/18/2019   Procedure: INTRAMEDULLARY (IM) NAIL INTERTROCHANTRIC;  Surgeon: Lyndle Herrlich, MD;  Location: [facility] ORS;  Service: Orthopedics;  Laterality: Right;  . TONSILLECTOMY      Family Psychiatric History: I have reviewed family psychiatric history from my progress note on 03/29/2018  Family History:  Family History  Problem Relation Age of Onset  . Diabetes Mother   . Stroke Mother   . Cancer Father        colon  . Colon cancer Father   . Colon cancer Other   . Mental illness Neg Hx     Social History: Reviewed social history from my progress note on 03/29/2018 Social History   Socioeconomic History  . Marital status: Single  Spouse name: Not on file  . Number of children: 0  . Years of education: [employer]  . Highest education level: High school graduate  Occupational History    Comment: disabled  Tobacco Use  . Smoking status: Never Smoker  . Smokeless tobacco: Never Used  Substance and Sexual Activity  . Alcohol use: No  . Drug use: No  . Sexual activity: Not Currently  Other Topics Concern  . Not on file  Social History Narrative   Lives with parents, mental  handicap, uses walker   Social Determinants of Health   Financial Resource Strain:   . Difficulty of Paying Living Expenses:   Food Insecurity:   . Worried About Programme researcher, broadcasting/film/video in the Last Year:   . Barista in the Last Year:   Transportation Needs:   . Freight forwarder (Medical):   Marland Kitchen Lack of Transportation (Non-Medical):   Physical Activity:   . Days of Exercise per Week:   . Minutes of Exercise per Session:   Stress:   . Feeling of Stress :   Social Connections:   . Frequency of Communication with Friends and Family:   . Frequency of Social Gatherings with Friends and Family:   . Attends Religious Services:   . Active Member of Clubs or Organizations:   . Attends Banker Meetings:   Marland Kitchen Marital Status:     Allergies:  Allergies  Allergen Reactions  . Prednisone Other (See Comments)    Did not like the way it made him feel   . Lamotrigine Rash    Metabolic Disorder Labs: Lab Results  Component Value Date   HGBA1C 4.8 09/24/2019   MPG 91 09/24/2019   MPG 91 10/05/2018   No results found for: PROLACTIN Lab Results  Component Value Date   CHOL 152 09/24/2019   TRIG 85 09/24/2019   HDL 45 09/24/2019   CHOLHDL 3.4 09/24/2019   LDLCALC 89 09/24/2019   Lab Results  Component Value Date   TSH 4.92 (H) 04/02/2020   TSH 6.62 (H) 09/24/2019    Therapeutic Level Labs: No results found for: LITHIUM No results found for: VALPROATE No components found for:  CBMZ  Current Medications: Current Outpatient Medications  Medication Sig Dispense Refill  . allopurinol (ZYLOPRIM) 100 MG tablet Take 1 tablet (100 mg total) by mouth daily. 90 tablet 3  . cholecalciferol (VITAMIN D) 1000 units tablet Take 1,000 Units by mouth daily.    . citalopram (CELEXA) 10 MG tablet Take 1 tablet (10 mg total) by mouth daily. 90 tablet 2  . EUTHYROX 25 MCG tablet Take 1 tablet (25 mcg total) by mouth daily before breakfast. 90 tablet 3  . fexofenadine  (ALLEGRA) 180 MG tablet Take 180 mg by mouth.    . propranolol (INDERAL) 10 MG tablet Take 1 tablet (10 mg total) by mouth 2 (two) times daily as needed. For severe anxiety/agitation (Patient taking differently: Take 10 mg by mouth daily. For severe anxiety/agitation) 180 tablet 2  . QUEtiapine (SEROQUEL) 25 MG tablet Take 1 tablet (25 mg total) by mouth at bedtime. 90 tablet 2  . thiamine (VITAMIN B-1) 100 MG tablet Take 100 mg by mouth daily.    . traZODone (DESYREL) 50 MG tablet Take 1.5 tablets (75 mg total) by mouth at bedtime as needed for sleep. 135 tablet 0  . Valbenazine Tosylate (INGREZZA) 80 MG CAPS Take 1 capsule by mouth at bedtime. 30 capsule 7  No current facility-administered medications for this visit.     Musculoskeletal: Strength & Muscle Tone: UTA Gait & Station: normal Patient leans: N/A  Psychiatric Specialty Exam: Review of Systems  Neurological: Positive for tremors.  Psychiatric/Behavioral: Positive for sleep disturbance.  All other systems reviewed and are negative.   There were no vitals taken for this visit.There is no height or weight on file to calculate BMI.  General Appearance: Casual  Eye Contact:  Fair  Speech:  Clear and Coherent  Volume:  Normal  Mood:  Euthymic  Affect:  Appropriate  Thought Process:  Goal Directed and Descriptions of Associations: Intact  Orientation:  Full (Time, Place, and Person)  Thought Content: Logical   Suicidal Thoughts:  No  Homicidal Thoughts:  No  Memory:  Immediate;   Fair Recent;   Fair Remote;   Fair  Judgement:  Other:  shallow  Insight:  Shallow  Psychomotor Activity:  Tremor  Concentration:  Concentration: Fair and Attention Span: Fair  Recall:  [employer] of Knowledge: Fair  Language: Fair  Akathisia:  No  Handed:  Right  AIMS (if indicated): UTA  Assets:  Wellsite geologist Vocational/Educational  ADL's:  Intact  Cognition: Limited  Sleep:  restless    Screenings: AIMS     Office Visit from 01/11/2019 in [place] Office Visit from 11/07/2018 in [place] Office Visit from 09/07/2018 in [place] Office Visit from 05/31/2018 in [place] Office Visit from 05/16/2018 in [place] Total Score  5  8  8  8  9     GAD-7     Office Visit from 07/07/2018 in [facility]  Total GAD-7 Score  15    PHQ2-9     Office Visit from 04/09/2020 in [facility] Office Visit from 10/10/2019 in [facility] Office Visit from 02/19/2019 in [facility] Office Visit from 07/07/2018 in [place]  PHQ-2 Total Score  0  0  0  2  PHQ-9 Total Score  0  --  --  10       Assessment and Plan: Xavien is a 54 year old Caucasian male who has a history of schizoaffective disorder, mild diabetes, GAD, hypothyroidism was evaluated by telemedicine today.  Patient with psychosocial stressors of the current pandemic is overall making progress except for sleep issues.  Patient however will also benefit from psychotherapy sessions as summarized above.  Plan as noted below.  Plan Schizoaffective disorder in remission Seroquel 25 mg p.o. nightly Increase trazodone to 75 mg p.o. nightly as needed Celexa 10 mg p.o. daily Propranolol 10 mg p.o. daily as needed for severe anxiety agitation  TD-stable Ingrezza 80 mg p.o. daily Patient continues to have mild tremors of his lips as well as upper extremities however improving. We will monitor closely.  Collateral information obtained from sister-[name] as summarized above.  Patient referred to psychotherapy sessions-provided Sister Butch Penny with information for therapist in the community.  Provided psychology today information.  Follow-up in clinic in 3 months or sooner if needed.  I have spent atleast  20 minutes non face to face with patient today. More than 50 % of the time was spent for preparing to see the patient ( e.g., review of test, records ), obtaining and to review and separately obtained history , ordering medications and test ,psychoeducation and supportive psychotherapy and care coordination,as  well as documenting clinical information in electronic health record. This note was generated in part or whole with voice recognition software. Voice recognition is usually quite accurate but there are transcription errors that can and very often do occur. I apologize for any typographical errors that were not detected and corrected.       Jomarie Longs, MD 04/09/2020, 11:59 AM

## 2020-04-09 NOTE — Assessment & Plan Note (Signed)
Stable chronic problem Managed by Psychiatry

## 2020-04-09 NOTE — Progress Notes (Signed)
Subjective:    Patient ID: Andres [name], male    DOB: 1966-10-07, 54 y.o.   MRN: [id number]  Andres [name] is a 54 y.o. male presenting on 04/09/2020 for Hypothyroidism   HPI   FOLLOW-UP Hypothyroidism / BMI >25 / Weight Recent history TSH up to >6, no change in levothyroxine (however manufacturer was changed to Euthyrox brand, now last lab showed TSH 4.9 and normal Free T4 (03/2020) Continues on Levothyroxine 40mcg, doing well without problems. Regarding weight, he has gained 7 lbs in 6 months, and doing well. No longer weight loss Good appetite, and regular diet. Limits sweets He is active outdoors - Denies any new complaints, abnormal temperature changes, nausea vomiting, fever chills sweats  Gout, chronic No recent flares, has done well. In past he had gout flare possibly in arm unsure. He had been on Allopurinol for a while at higher dose. Last visit 09/2019, he was reduced from 300 down to 100mg , and his Uric Acid trend was monitored, previously was 3.2 (09/2019) Now current lab result shows Uric Acid 3.5 (03/2020) He continues Allopurinol 100mg  daily, tolerating well. No recent gout flares  CHRONIC HTN: Reportsdoing well, normal BP Current Meds -no regular meds Denies CP, dyspnea, HA, edema, dizziness / lightheadedness  Schizoaffective Disorder / Drug Induced Parkinsons Followed by Psychiatry Managed on current medications. Last visit 12/2019, they adjusted Trazodone from 50 up to 100mg  nightly PRN for insomnia that has helped.   Health Maintenance: Due for COVID19 vaccine, interested to pursue.  Depression screen [facility] 2/9 04/09/2020 10/10/2019 02/19/2019  Decreased Interest 0 0 0  Down, Depressed, Hopeless 0 0 0  PHQ - 2 Score 0 0 0  Altered sleeping 0 - -  Tired, decreased energy 0 - -  Change in appetite 0 - -  Feeling bad or failure about yourself  0 - -  Trouble concentrating 0 - -  Moving slowly or fidgety/restless 0 - -  Suicidal thoughts 0 - -  PHQ-9  Score 0 - -  Difficult doing work/chores Not difficult at all - -    Social History   Tobacco Use  . Smoking status: Never Smoker  . Smokeless tobacco: Never Used  Substance Use Topics  . Alcohol use: No  . Drug use: No    Review of Systems Per HPI unless specifically indicated above     Objective:    BP 116/67   Pulse (!) 49   Temp 97.7 F (36.5 C) (Temporal)   Resp 16   Ht 5' 5.5" (1.664 m)   Wt 156 lb (70.8 kg)   BMI 25.56 kg/m   Wt Readings from Last 3 Encounters:  04/09/20 156 lb (70.8 kg)  10/10/19 149 lb (67.6 kg)  06/17/19 160 lb (72.6 kg)    Physical Exam Vitals and nursing note reviewed.  Constitutional:      General: He is not in acute distress.    Appearance: He is well-developed. He is not diaphoretic.     Comments: Well-appearing, comfortable, cooperative  HENT:     Head: Normocephalic and atraumatic.  Eyes:     General:        Right eye: No discharge.        Left eye: No discharge.     Conjunctiva/sclera: Conjunctivae normal.  Cardiovascular:     Rate and Rhythm: Normal rate.     Pulses: Normal pulses.     Heart sounds: No murmur.  Pulmonary:     Effort: Pulmonary effort  is normal. No respiratory distress.     Breath sounds: Normal breath sounds. No wheezing or rales.  Abdominal:     General: Bowel sounds are normal. There is no distension.     Palpations: Abdomen is soft.  Musculoskeletal:     Left lower leg: Edema (trace to +1 ankle, prior history fracture w hardware) present.  Skin:    General: Skin is warm and dry.     Findings: No erythema or rash.  Neurological:     Mental Status: He is alert and oriented to person, place, and time.  Psychiatric:        Behavior: Behavior normal.     Comments: Well groomed, good eye contact. Increased frequency of speech with normal thoughts, occasional interrupting and some pressured speech. Appears to be at baseline with some cognitive delay      Results for orders placed or performed in  visit on 04/02/20  T4, free  Result Value Ref Range   Free T4 1.3 0.8 - 1.8 ng/dL  TSH  Result Value Ref Range   TSH 4.92 (H) 0.40 - 4.50 mIU/L  Uric acid  Result Value Ref Range   Uric Acid, Serum 3.5 (L) 4.0 - 8.0 mg/dL      Assessment & Plan:   Problem List Items Addressed This Visit    Schizoaffective disorder, depressive type (HCC) - Primary    Stable chronic problem Managed by Psychiatry On med management Had increased Trazodone in PM from 50 to 100mg  Has upcoming visit today virtual      Mild intellectual disability   Hypothyroidism    Stable, controlled now normalized on levothyroxine low dose TSH down from >6 down to 4.9, without dose change. Normal Free T4 Likely metabolism more balanced, appetite improved, some normal wt gain  Plan Continue current Levothyroxine daily (Brand Euthyrox) - no dose change Re-check TSH Free T4 in 6 months at annual Follow-up      Essential hypertension    Controlled HTN off medication currently Not checking BP regularly    Plan:  1. Remain off meds for now 2. Encourage improved lifestyle - low sodium diet, regular exercise 3. May try to monitor BP outside office, bring readings to next visit, if persistently >140/90 or new symptoms notify office sooner      Drug-induced Parkinsonism (HCC)    Stable chronic problem Managed by Psychiatry      Chronic tophaceous gout    Well controlled on prophylaxis Dose reduction Allopurinol 300 to 100 over past 6 months without flare Last uric acid 3.5 (03/2020) was 3.2 (09/2019 back on 300mg )  Plan Continue current Allopurinol 100mg  daily, discussed benefit risk, agree to keep on low dose to prevent gout Future if Uric acid remains very well controlled we can consider DC med but defer for now         No orders of the defined types were placed in this encounter.    Follow up plan: Return in about 6 months (around 10/09/2020) for Annual Physical.  Future labs ordered for  10/08/20 - A1c CMET CBC LIPID VIT D, Uric Acid, TSH, Free T4, Vit D   , DO [facility] 04/09/2020, 8:40 AM

## 2020-05-27 ENCOUNTER — Other Ambulatory Visit: Payer: Self-pay | Admitting: Psychiatry

## 2020-05-27 DIAGNOSIS — F251 Schizoaffective disorder, depressive type: Secondary | ICD-10-CM

## 2020-06-05 ENCOUNTER — Other Ambulatory Visit: Payer: Self-pay | Admitting: Family Medicine

## 2020-06-05 DIAGNOSIS — E039 Hypothyroidism, unspecified: Secondary | ICD-10-CM

## 2020-06-16 ENCOUNTER — Telehealth: Payer: Self-pay

## 2020-06-16 DIAGNOSIS — F259 Schizoaffective disorder, unspecified: Secondary | ICD-10-CM

## 2020-06-16 MED ORDER — TRAZODONE HCL 50 MG PO TABS: 75.0000 mg | ORAL_TABLET | Freq: Every evening | ORAL | 0 refills | Status: DC | PRN

## 2020-06-16 NOTE — Telephone Encounter (Signed)
I have sent trazodone to pharmacy. 

## 2020-06-16 NOTE — Telephone Encounter (Signed)
pt states her brother needs a refill on his trazodone he is taking 1.5 tablet he is sleeping better.

## 2020-07-02 ENCOUNTER — Telehealth (INDEPENDENT_AMBULATORY_CARE_PROVIDER_SITE_OTHER): Payer: Medicare Other | Admitting: Psychiatry

## 2020-07-02 ENCOUNTER — Other Ambulatory Visit: Payer: Self-pay

## 2020-07-02 ENCOUNTER — Encounter: Payer: Self-pay | Admitting: Psychiatry

## 2020-07-02 DIAGNOSIS — F70 Mild intellectual disabilities: Secondary | ICD-10-CM | POA: Diagnosis not present

## 2020-07-02 DIAGNOSIS — G2401 Drug induced subacute dyskinesia: Secondary | ICD-10-CM

## 2020-07-02 DIAGNOSIS — F259 Schizoaffective disorder, unspecified: Secondary | ICD-10-CM

## 2020-07-02 NOTE — Progress Notes (Signed)
Provider Location : [facility] Patient Location : [place]  Virtual Visit via Video Note  I connected with Andres [name] on 07/02/20 at 11:30 AM EDT by a video enabled telemedicine application and verified that I am speaking with the correct person using two identifiers.   I discussed the limitations of evaluation and management by telemedicine and the availability of in person appointments. The patient expressed understanding and agreed to proceed.     I discussed the assessment and treatment plan with the patient. The patient was provided an opportunity to ask questions and all were answered. The patient agreed with the plan and demonstrated an understanding of the instructions.   The patient was advised to call back or seek an in-person evaluation if the symptoms worsen or if the condition fails to improve as anticipated.   BH MD OP Progress Note  07/02/2020 12:05 PM DAVEY LIMAS  MRN:  [id number]  Chief Complaint:  Chief Complaint    Follow-up     HPI: Andres [name] is a 54 year old Caucasian male who has a history of schizoaffective disorder, GAD, hypothyroidism, mild intellectual disability was evaluated by telemedicine today.  Collateral information was obtained from sister Lupita Leash.  Patient today appeared to be alert, oriented and was able to answer questions.  Patient reports he is doing well.  His anxiety and mood symptoms have improved.  He is sleeping better.  He is compliant on medications.  Denies side effects.  His abnormal involuntary movements of his hands have improved and he is able to feed himself with a fork.  He continues to be compliant on Ingrezza.  Per sister patient is currently spending time with family as well as friends.  He has someone who comes into the home Monday through Friday to cook, housekeeping as well as takes him out for grocery shopping as well as walks and plays board games with him.  Patient enjoys the company and reports that also helps him with  his mood.  Patient has not yet received his COVID-19 vaccine however is currently thinking about getting it.  Patient denies any suicidality, homicidality or perceptual disturbances.  Patient reports appetite is good.  Patient denies any other concerns today.  Visit Diagnosis:    ICD-10-CM   1. Schizoaffective disorder, in remission (HCC)  F25.9   2. Tardive dyskinesia  G24.01   3. Mild intellectual disability  F70     Past Psychiatric History: I have reviewed past psychiatric history from my progress note on 03/29/2018  Past Medical History:  Past Medical History:  Diagnosis Date  . Allergy     Past Surgical History:  Procedure Laterality Date  . ANKLE SURGERY    . COLON SURGERY    . HERNIA REPAIR    . INTRAMEDULLARY (IM) NAIL INTERTROCHANTERIC Right 06/18/2019   Procedure: INTRAMEDULLARY (IM) NAIL INTERTROCHANTRIC;  Surgeon: Lyndle Herrlich, MD;  Location: [facility] ORS;  Service: Orthopedics;  Laterality: Right;  . TONSILLECTOMY      Family Psychiatric History: Reviewed family psychiatric history from my progress note on 03/29/2018  Family History:  Family History  Problem Relation Age of Onset  . Diabetes Mother   . Stroke Mother   . Cancer Father        colon  . Colon cancer Father   . Colon cancer Other   . Mental illness Neg Hx     Social History: Reviewed social history from my progress note on 03/29/2018 Social History   Socioeconomic History  .  Marital status: Single    Spouse name: Not on file  . Number of children: 0  . Years of education: [employer]  . Highest education level: High school graduate  Occupational History    Comment: disabled  Tobacco Use  . Smoking status: Never Smoker  . Smokeless tobacco: Never Used  Vaping Use  . Vaping Use: Never used  Substance and Sexual Activity  . Alcohol use: No  . Drug use: No  . Sexual activity: Not Currently  Other Topics Concern  . Not on file  Social History Narrative   Lives with parents, mental  handicap, uses walker   Social Determinants of Health   Financial Resource Strain:   . Difficulty of Paying Living Expenses:   Food Insecurity:   . Worried About Programme researcher, broadcasting/film/video in the Last Year:   . Barista in the Last Year:   Transportation Needs:   . Freight forwarder (Medical):   Marland Kitchen Lack of Transportation (Non-Medical):   Physical Activity:   . Days of Exercise per Week:   . Minutes of Exercise per Session:   Stress:   . Feeling of Stress :   Social Connections:   . Frequency of Communication with Friends and Family:   . Frequency of Social Gatherings with Friends and Family:   . Attends Religious Services:   . Active Member of Clubs or Organizations:   . Attends Banker Meetings:   Marland Kitchen Marital Status:     Allergies:  Allergies  Allergen Reactions  . Prednisone Other (See Comments)    Did not like the way it made him feel   . Lamotrigine Rash    Metabolic Disorder Labs: Lab Results  Component Value Date   HGBA1C 4.8 09/24/2019   MPG 91 09/24/2019   MPG 91 10/05/2018   No results found for: PROLACTIN Lab Results  Component Value Date   CHOL 152 09/24/2019   TRIG 85 09/24/2019   HDL 45 09/24/2019   CHOLHDL 3.4 09/24/2019   LDLCALC 89 09/24/2019   Lab Results  Component Value Date   TSH 4.92 (H) 04/02/2020   TSH 6.62 (H) 09/24/2019    Therapeutic Level Labs: No results found for: LITHIUM No results found for: VALPROATE No components found for:  CBMZ  Current Medications: Current Outpatient Medications  Medication Sig Dispense Refill  . allopurinol (ZYLOPRIM) 100 MG tablet Take 1 tablet (100 mg total) by mouth daily. 90 tablet 3  . cholecalciferol (VITAMIN D) 1000 units tablet Take 1,000 Units by mouth daily.    . citalopram (CELEXA) 10 MG tablet TAKE 1 TABLET(10 MG) BY MOUTH DAILY 90 tablet 2  . fexofenadine (ALLEGRA) 180 MG tablet Take 180 mg by mouth.    . levothyroxine (SYNTHROID) 25 MCG tablet TAKE 1 TABLET BY MOUTH  DAILY BEFORE BREAKFAST ON AN EMPTY STOMACH( NOT TO TAKE WITH OTHER MEDICINES) 90 tablet 2  . propranolol (INDERAL) 10 MG tablet Take 1 tablet (10 mg total) by mouth 2 (two) times daily as needed. For severe anxiety/agitation (Patient taking differently: Take 10 mg by mouth daily. For severe anxiety/agitation) 180 tablet 2  . QUEtiapine (SEROQUEL) 25 MG tablet Take 1 tablet (25 mg total) by mouth at bedtime. 90 tablet 2  . thiamine (VITAMIN B-1) 100 MG tablet Take 100 mg by mouth daily.    . traZODone (DESYREL) 50 MG tablet Take 1.5 tablets (75 mg total) by mouth at bedtime as needed for sleep. 135 tablet  0  . Valbenazine Tosylate (INGREZZA) 80 MG CAPS Take 1 capsule by mouth at bedtime. 30 capsule 7  . amoxicillin (AMOXIL) 500 MG capsule Take by mouth. (Patient not taking: Reported on 07/02/2020)     No current facility-administered medications for this visit.     Musculoskeletal: Strength & Muscle Tone: UTA Gait & Station: normal Patient leans: N/A  Psychiatric Specialty Exam: Review of Systems  Psychiatric/Behavioral: Negative for agitation, behavioral problems, confusion, decreased concentration, dysphoric mood, hallucinations, self-injury, sleep disturbance and suicidal ideas. The patient is not nervous/anxious and is not hyperactive.   All other systems reviewed and are negative.   There were no vitals taken for this visit.There is no height or weight on file to calculate BMI.  General Appearance: Casual  Eye Contact:  Fair  Speech:  Clear and Coherent  Volume:  Normal  Mood:  Euthymic  Affect:  Congruent  Thought Process:  Goal Directed and Descriptions of Associations: Intact  Orientation:  Full (Time, Place, and Person)  Thought Content: Logical   Suicidal Thoughts:  No  Homicidal Thoughts:  No  Memory:  Immediate;   Fair Recent;   Fair Remote;   Fair  Judgement:  Fair  Insight:  Fair  Psychomotor Activity:  Normal  Concentration:  Concentration: Fair and Attention Span:  Fair  Recall:  [employer] of Knowledge: Fair  Language: Fair  Akathisia:  No  Handed:  Right  AIMS (if indicated):Reports TD as improved  Assets:  Communication Skills Desire for Improvement Housing Social Support  ADL's:  Intact  Cognition: WNL  Sleep:  Fair   Screenings: AIMS     [facility] Visit from 01/11/2019 in [facility] Office Visit from 11/07/2018 in [facility] Office Visit from 09/07/2018 in [facility] Office Visit from 05/31/2018 in [facility] Office Visit from 05/16/2018 in [facility]  AIMS Total Score 5 8 8 8 9     GAD-7     Office Visit from 07/07/2018 in [facility]  Total GAD-7 Score 15    PHQ2-9     Office Visit from 04/09/2020 in [facility] Office Visit from 10/10/2019 in [facility] Office Visit from 02/19/2019 in [facility] Office Visit from 07/07/2018 in [place]  PHQ-2 Total Score 0 0 0 2  PHQ-9 Total Score 0 -- -- 10       Assessment and Plan: QAADIR KENT is a 54 year old Caucasian male who has a history of schizoaffective disorder, mild diabetes, GAD, hypothyroidism was evaluated by telemedicine today.  Patient with psychosocial stressors of the current pandemic is overall stable on current medication regimen.  Plan as noted below.  Plan Schizoaffective disorder in remission Seroquel 25 mg p.o. nightly Trazodone 75 mg p.o. nightly as needed Celexa 10 mg p.o. daily Propranolol 10 mg p.o. daily as needed for severe anxiety/agitation  TG-stable Ingrezza 80 mg p.o. daily  Collateral information was obtained from sister-[name] as summarized above.  Follow-up in clinic in 3 months or sooner if needed.  I have spent atleast 20 minutes non face to face with patient today. More than 50 % of the time was spent for preparing to see the  patient ( e.g., review of test, records ), obtaining and to review and separately obtained history , ordering medications and test ,psychoeducation and supportive psychotherapy and care coordination,as well as documenting clinical information in electronic health record.  This note was generated in part or whole with voice recognition software. Voice recognition is usually quite accurate but there are transcription errors that can and very often do occur. I apologize for any typographical errors that were not detected and corrected.      Jomarie LongsSaramma Nakota Ackert, MD 07/02/2020, 12:05 PM

## 2020-09-14 ENCOUNTER — Other Ambulatory Visit: Payer: Self-pay | Admitting: Psychiatry

## 2020-09-14 DIAGNOSIS — F259 Schizoaffective disorder, unspecified: Secondary | ICD-10-CM

## 2020-10-07 ENCOUNTER — Other Ambulatory Visit: Payer: Self-pay | Admitting: *Deleted

## 2020-10-07 DIAGNOSIS — R7309 Other abnormal glucose: Secondary | ICD-10-CM

## 2020-10-07 DIAGNOSIS — I10 Essential (primary) hypertension: Secondary | ICD-10-CM

## 2020-10-07 DIAGNOSIS — Z125 Encounter for screening for malignant neoplasm of prostate: Secondary | ICD-10-CM

## 2020-10-07 DIAGNOSIS — Z79899 Other long term (current) drug therapy: Secondary | ICD-10-CM

## 2020-10-07 DIAGNOSIS — F251 Schizoaffective disorder, depressive type: Secondary | ICD-10-CM

## 2020-10-07 DIAGNOSIS — Z0000 Encounter for general adult medical examination without abnormal findings: Secondary | ICD-10-CM

## 2020-10-07 DIAGNOSIS — M1A9XX1 Chronic gout, unspecified, with tophus (tophi): Secondary | ICD-10-CM

## 2020-10-07 DIAGNOSIS — R351 Nocturia: Secondary | ICD-10-CM

## 2020-10-07 DIAGNOSIS — E039 Hypothyroidism, unspecified: Secondary | ICD-10-CM

## 2020-10-07 DIAGNOSIS — E559 Vitamin D deficiency, unspecified: Secondary | ICD-10-CM

## 2020-10-08 ENCOUNTER — Other Ambulatory Visit: Payer: Self-pay

## 2020-10-08 ENCOUNTER — Telehealth (INDEPENDENT_AMBULATORY_CARE_PROVIDER_SITE_OTHER): Payer: Medicare Other | Admitting: Psychiatry

## 2020-10-08 ENCOUNTER — Encounter: Payer: Self-pay | Admitting: Psychiatry

## 2020-10-08 ENCOUNTER — Other Ambulatory Visit: Payer: Medicare Other

## 2020-10-08 DIAGNOSIS — F419 Anxiety disorder, unspecified: Secondary | ICD-10-CM

## 2020-10-08 DIAGNOSIS — F70 Mild intellectual disabilities: Secondary | ICD-10-CM

## 2020-10-08 DIAGNOSIS — G2401 Drug induced subacute dyskinesia: Secondary | ICD-10-CM

## 2020-10-08 DIAGNOSIS — F251 Schizoaffective disorder, depressive type: Secondary | ICD-10-CM

## 2020-10-08 MED ORDER — PROPRANOLOL HCL 10 MG PO TABS: 10.0000 mg | ORAL_TABLET | Freq: Two times a day (BID) | ORAL | 2 refills | Status: DC | PRN

## 2020-10-08 MED ORDER — CITALOPRAM HYDROBROMIDE 20 MG PO TABS: 20.0000 mg | ORAL_TABLET | Freq: Every day | ORAL | 1 refills | Status: DC

## 2020-10-08 MED ORDER — QUETIAPINE FUMARATE 25 MG PO TABS: 25.0000 mg | ORAL_TABLET | Freq: Every day | ORAL | 2 refills | Status: DC

## 2020-10-08 MED ORDER — TRAZODONE HCL 100 MG PO TABS: 100.0000 mg | ORAL_TABLET | Freq: Every day | ORAL | 1 refills | Status: DC

## 2020-10-08 NOTE — Progress Notes (Signed)
Provider Location : [facility] Patient Location : Sister's work  Participants: Patient ,Sister , Building control surveyor Visit via Video Note  I connected with Andres [name] on 10/08/20 at 11:30 AM EDT by a video enabled telemedicine application and verified that I am speaking with the correct person using two identifiers.   I discussed the limitations of evaluation and management by telemedicine and the availability of in person appointments. The patient expressed understanding and agreed to proceed.   I discussed the assessment and treatment plan with the patient. The patient was provided an opportunity to ask questions and all were answered. The patient agreed with the plan and demonstrated an understanding of the instructions.   The patient was advised to call back or seek an in-person evaluation if the symptoms worsen or if the condition fails to improve as anticipated.  BH MD/PA/NP OP Progress Note  10/08/2020 12:08 PM Andres [name]  MRN:  [id number]  Chief Complaint:  Chief Complaint    Follow-up     HPI: Andres [name] is a 54 year old Caucasian male, has a history of schizoaffective disorder, GAD, hypothyroidism, mild intellectual disability was evaluated by telemedicine today.  Collateral information was obtained from Sister-Andres [name]  Patient today reports he is doing okay however does report he has found it difficult to get up and go and currently uses a walker to walk because of that.  According to sister Andres [name], patient was walking okay till August.  However in August he had an appointment at his primary provider's office where he was scheduled to get a colonoscopy soon.  He walked into the office that day without a walker however starting the next day he started talking about having difficulty walking without a walker.  He is afraid that he might fall if he does not use a walker however has not had any falls yet.  According to sister she is not sure if this is just anxiety or if there is  anything else going on.  He does have upcoming appointment with his primary care provider for a physical examination.  Patient does report sleep issues.  He reports he wakes up several times at least 2-3 times a week.  He currently takes trazodone 75 mg.  He is also on Seroquel.  Patient does not know if he is anxious or sad however according to sister she has observed him not to be as cheerful as he used to before.  She does not know if this is due to him staying home a lot and not having a day program to go to anymore due to the pandemic.  He however does have friends who come over couple of times a week and spends time with him.  Patient was observed to have some jaw movements, yawning-like movements during the visit today.  Patient however reports this has been going on for a while.  It does not bother him much.    Patient denies any suicidality, homicidality or perceptual disturbances.  Patient denies any other concerns today.    Visit Diagnosis:    ICD-10-CM   1. Schizoaffective disorder, depressive type (HCC)  F25.1 traZODone (DESYREL) 100 MG tablet    citalopram (CELEXA) 20 MG tablet    QUEtiapine (SEROQUEL) 25 MG tablet    propranolol (INDERAL) 10 MG tablet  2. Tardive dyskinesia  G24.01   3. Anxiety disorder, unspecified type  F41.9   4. Mild intellectual disability  F70     Past Psychiatric History: I have reviewed  past psychiatric history from my progress note on 03/29/2018  Past Medical History:  Past Medical History:  Diagnosis Date  . Allergy     Past Surgical History:  Procedure Laterality Date  . ANKLE SURGERY    . COLON SURGERY    . HERNIA REPAIR    . INTRAMEDULLARY (IM) NAIL INTERTROCHANTERIC Right 06/18/2019   Procedure: INTRAMEDULLARY (IM) NAIL INTERTROCHANTRIC;  Surgeon: Lyndle Herrlich, MD;  Location: [facility];  Service: Orthopedics;  Laterality: Right;  . TONSILLECTOMY      Family Psychiatric History: Reviewed family psychiatric history from my  progress note on 03/29/2018  Family History:  Family History  Problem Relation Age of Onset  . Diabetes Mother   . Stroke Mother   . Cancer Father        colon  . Colon cancer Father   . Colon cancer Other   . Mental illness Neg Hx     Social History: Reviewed social history from my progress note on 03/29/2018 Social History   Socioeconomic History  . Marital status: Single    Spouse name: Not on file  . Number of children: 0  . Years of education: [employer]  . Highest education level: High school graduate  Occupational History    Comment: disabled  Tobacco Use  . Smoking status: Never Smoker  . Smokeless tobacco: Never Used  Vaping Use  . Vaping Use: Never used  Substance and Sexual Activity  . Alcohol use: No  . Drug use: No  . Sexual activity: Not Currently  Other Topics Concern  . Not on file  Social History Narrative   Lives with parents, mental handicap, uses walker   Social Determinants of Health   Financial Resource Strain:   . Difficulty of Paying Living Expenses: Not on file  Food Insecurity:   . Worried About Programme researcher, broadcasting/film/video in the Last Year: Not on file  . Ran Out of Food in the Last Year: Not on file  Transportation Needs:   . Lack of Transportation (Medical): Not on file  . Lack of Transportation (Non-Medical): Not on file  Physical Activity:   . Days of Exercise per Week: Not on file  . Minutes of Exercise per Session: Not on file  Stress:   . Feeling of Stress : Not on file  Social Connections:   . Frequency of Communication with Friends and Family: Not on file  . Frequency of Social Gatherings with Friends and Family: Not on file  . Attends Religious Services: Not on file  . Active Member of Clubs or Organizations: Not on file  . Attends Banker Meetings: Not on file  . Marital Status: Not on file    Allergies:  Allergies  Allergen Reactions  . Prednisone Other (See Comments)    Did not like the way it made him feel    . Lamotrigine Rash    Metabolic Disorder Labs: Lab Results  Component Value Date   HGBA1C 4.8 09/24/2019   MPG 91 09/24/2019   MPG 91 10/05/2018   No results found for: PROLACTIN Lab Results  Component Value Date   CHOL 152 09/24/2019   TRIG 85 09/24/2019   HDL 45 09/24/2019   CHOLHDL 3.4 09/24/2019   LDLCALC 89 09/24/2019   Lab Results  Component Value Date   TSH 4.92 (H) 04/02/2020   TSH 6.62 (H) 09/24/2019    Therapeutic Level Labs: No results found for: LITHIUM No results found for: VALPROATE No components  found for:  CBMZ  Current Medications: Current Outpatient Medications  Medication Sig Dispense Refill  . allopurinol (ZYLOPRIM) 100 MG tablet Take 1 tablet (100 mg total) by mouth daily. 90 tablet 3  . amoxicillin (AMOXIL) 500 MG capsule Take by mouth. (Patient not taking: Reported on 07/02/2020)    . cholecalciferol (VITAMIN D) 1000 units tablet Take 1,000 Units by mouth daily.    . citalopram (CELEXA) 20 MG tablet Take 1 tablet (20 mg total) by mouth daily. 90 tablet 1  . fexofenadine (ALLEGRA) 180 MG tablet Take 180 mg by mouth.    . levothyroxine (SYNTHROID) 25 MCG tablet TAKE 1 TABLET BY MOUTH DAILY BEFORE BREAKFAST ON AN EMPTY STOMACH( NOT TO TAKE WITH OTHER MEDICINES) 90 tablet 2  . propranolol (INDERAL) 10 MG tablet Take 1 tablet (10 mg total) by mouth 2 (two) times daily as needed. For severe anxiety/agitation 180 tablet 2  . QUEtiapine (SEROQUEL) 25 MG tablet Take 1 tablet (25 mg total) by mouth at bedtime. 90 tablet 2  . thiamine (VITAMIN B-1) 100 MG tablet Take 100 mg by mouth daily.    . traZODone (DESYREL) 100 MG tablet Take 1 tablet (100 mg total) by mouth at bedtime. 90 tablet 1  . Valbenazine Tosylate (INGREZZA) 80 MG CAPS Take 1 capsule by mouth at bedtime. 30 capsule 7   No current facility-administered medications for this visit.     Musculoskeletal: Strength & Muscle Tone: UTA Gait & Station: Seated Patient leans: N/A  Psychiatric  Specialty Exam: Review of Systems  Psychiatric/Behavioral: Positive for sleep disturbance. The patient is nervous/anxious.   All other systems reviewed and are negative.   There were no vitals taken for this visit.There is no height or weight on file to calculate BMI.  General Appearance: Casual  Eye Contact:  Fair  Speech:  Clear and Coherent  Volume:  Normal  Mood:  Anxious about walking by self, uses a walker  Affect:  Congruent  Thought Process:  Goal Directed and Descriptions of Associations: Intact  Orientation:  Full (Time, Place, and Person)  Thought Content: Logical   Suicidal Thoughts:  No  Homicidal Thoughts:  No  Memory:  Immediate;   Fair Recent;   Fair Remote;   Fair  Judgement:  Fair  Insight:  Fair  Psychomotor Activity:  Tremor improved  Concentration:  Concentration: Fair and Attention Span: Fair  Recall:  [employer]  Fund of Knowledge: Fair  Language: Fair  Akathisia:  No  Handed:  Right  AIMS (if indicated): Jaw movements observed in session, yawning like movements  Assets:  Communication Skills Desire for Improvement Housing Social Support  ADL's:  Intact  Cognition: WNL  Sleep:  Poor   Screenings: AIMS     [facility]11[facility]2019 [facility] from [facility]  AIMS Total Score 5 8 8 8 9     GAD-7     [facility]  Total GAD-7 Score 15    PHQ2-9     [facility] in [facility] Office Visit from 10/10/2019 in [facility] Office [facility]2018 [facility]  PHQ-2 Total Score 0 0 0 2  PHQ-9 Total Score 0 -- -- 10       Assessment and Plan: BENZ VANDENBERGHE is a 54 year old Caucasian male who has a history of schizoaffective disorder, diabetes, GAD, hypothyroidism, was evaluated by telemedicine today.  Patient per collateral information as well as evaluation today likely with worsening anxiety, depression,sleep problems and will benefit from medication readjustment.  Discussed plan as noted below.  Plan Schizoaffective disorder -unstable Seroquel 25 mg p.o. nightly Increase trazodone to 100 mg p.o. nightly as needed Increase Celexa to 20 mg p.o. daily. Propranolol 10 mg p.o. daily as needed for severe anxiety/agitation  TD-stable Ingrezza 80 mg p.o. daily Patient does have some jaw movement observed in session today.  Will monitor.  Anxiety disorder unspecified-unstable Will refer patient for CBT.  Collateral information was obtained from [facility] as summarized above    Follow-up in clinic in 2 months or sooner if needed.  I have spent atleast 20 minutes  face to face by video with patient today. More than 50 % of the time was spent for preparing to see the patient ( e.g., review of test, records ), obtaining and to review and separately obtained history , ordering medications and test ,psychoeducation and supportive psychotherapy and care coordination,as well as documenting clinical information in electronic health record. This note was generated in part or whole with voice recognition software. Voice recognition is usually quite accurate but there are transcription errors that can and very often do occur. I apologize for any typographical errors that were not detected and corrected.     Jomarie Longs, MD 10/08/2020, 12:08 PM

## 2020-10-09 LAB — CBC WITH DIFFERENTIAL/PLATELET
Absolute Monocytes: 414 cells/uL (ref 200–950)
Basophils Absolute: 32 cells/uL (ref 0–200)
Basophils Relative: 0.7 %
Eosinophils Absolute: 212 cells/uL (ref 15–500)
Eosinophils Relative: 4.6 %
HCT: 47 % (ref 38.5–50.0)
Hemoglobin: 15.8 g/dL (ref 13.2–17.1)
Lymphs Abs: 1348 cells/uL (ref 850–3900)
MCH: 31.1 pg (ref 27.0–33.0)
MCHC: 33.6 g/dL (ref 32.0–36.0)
MCV: 92.5 fL (ref 80.0–100.0)
MPV: 11.7 fL (ref 7.5–12.5)
Monocytes Relative: 9 %
Neutro Abs: 2594 cells/uL (ref 1500–7800)
Neutrophils Relative %: 56.4 %
Platelets: 122 10*3/uL — ABNORMAL LOW (ref 140–400)
RBC: 5.08 10*6/uL (ref 4.20–5.80)
RDW: 12.8 % (ref 11.0–15.0)
Total Lymphocyte: 29.3 %
WBC: 4.6 10*3/uL (ref 3.8–10.8)

## 2020-10-09 LAB — LIPID PANEL
Cholesterol: 143 mg/dL (ref <200)
HDL: 46 mg/dL (ref >=40)
LDL Cholesterol (Calc): 79 mg/dL (calc)
Non-HDL Cholesterol (Calc): 97 mg/dL (calc) (ref <130)
Total CHOL/HDL Ratio: 3.1 (calc) (ref <5.0)
Triglycerides: 101 mg/dL (ref <150)

## 2020-10-09 LAB — URIC ACID: Uric Acid, Serum: 5.5 mg/dL (ref 4.0–8.0)

## 2020-10-09 LAB — COMPLETE METABOLIC PANEL WITH GFR
AG Ratio: 2.2 (calc) (ref 1.0–2.5)
ALT: 13 U/L (ref 9–46)
AST: 13 U/L (ref 10–35)
Albumin: 4.2 g/dL (ref 3.6–5.1)
Alkaline phosphatase (APISO): 72 U/L (ref 35–144)
BUN: 18 mg/dL (ref 7–25)
CO2: 26 mmol/L (ref 20–32)
Calcium: 9.3 mg/dL (ref 8.6–10.3)
Chloride: 109 mmol/L (ref 98–110)
Creat: 1.03 mg/dL (ref 0.70–1.33)
GFR, Est African American: 94 mL/min/{1.73_m2} (ref >=60)
GFR, Est Non African American: 81 mL/min/{1.73_m2} (ref >=60)
Globulin: 1.9 g/dL (calc) (ref 1.9–3.7)
Glucose, Bld: 77 mg/dL (ref 65–99)
Potassium: 3.7 mmol/L (ref 3.5–5.3)
Sodium: 145 mmol/L (ref 135–146)
Total Bilirubin: 0.7 mg/dL (ref 0.2–1.2)
Total Protein: 6.1 g/dL (ref 6.1–8.1)

## 2020-10-09 LAB — TSH: TSH: 4.47 mIU/L (ref 0.40–4.50)

## 2020-10-09 LAB — T4, FREE: Free T4: 1.4 ng/dL (ref 0.8–1.8)

## 2020-10-09 LAB — PSA: PSA: 0.63 ng/mL (ref <=4.0)

## 2020-10-09 LAB — VITAMIN D 25 HYDROXY (VIT D DEFICIENCY, FRACTURES): Vit D, 25-Hydroxy: 36 ng/mL (ref 30–100)

## 2020-10-15 ENCOUNTER — Ambulatory Visit (INDEPENDENT_AMBULATORY_CARE_PROVIDER_SITE_OTHER): Payer: Medicare Other | Admitting: Family Medicine

## 2020-10-15 ENCOUNTER — Other Ambulatory Visit: Payer: Self-pay

## 2020-10-15 ENCOUNTER — Encounter: Payer: Self-pay | Admitting: Family Medicine

## 2020-10-15 VITALS — BP 118/62 | HR 54 | Temp 97.1°F | Resp 16 | Ht 65.5 in | Wt 153.8 lb

## 2020-10-15 DIAGNOSIS — M1A9XX1 Chronic gout, unspecified, with tophus (tophi): Secondary | ICD-10-CM

## 2020-10-15 DIAGNOSIS — Z23 Encounter for immunization: Secondary | ICD-10-CM | POA: Diagnosis not present

## 2020-10-15 DIAGNOSIS — Z0000 Encounter for general adult medical examination without abnormal findings: Secondary | ICD-10-CM

## 2020-10-15 DIAGNOSIS — E039 Hypothyroidism, unspecified: Secondary | ICD-10-CM

## 2020-10-15 DIAGNOSIS — I10 Essential (primary) hypertension: Secondary | ICD-10-CM

## 2020-10-15 DIAGNOSIS — F251 Schizoaffective disorder, depressive type: Secondary | ICD-10-CM

## 2020-10-15 MED ORDER — LEVOTHYROXINE SODIUM 25 MCG PO TABS: ORAL_TABLET | ORAL | 3 refills | Status: DC

## 2020-10-15 MED ORDER — ALLOPURINOL 100 MG PO TABS: 100.0000 mg | ORAL_TABLET | Freq: Every day | ORAL | 3 refills | Status: DC

## 2020-10-15 NOTE — Patient Instructions (Addendum)
Thank you for coming to the office today.  Refilled Levothyroxine, Allopurinol  Mild low platelet trend over the past 1-2 years. No other concerns on that lab. Not low enough for bleeding risk  1. Chemistry - Normal results, including electrolytes, kidney and liver function. - Normal fasting blood sugar   2. Hemoglobin A1c (Diabetes screening) - not completed, we can do fingerstick A1c if needed otherwise it is not a concern based on last readings.  3. PSA Prostate Cancer Screening - 0.63, negative.  4. TSH Thyroid Function Tests - Controlled, on current levothyroxine daily.  5. CBC Blood Counts - mostly normal, no anemia, persistent low platelets but improved from some prior results.  6. Cholesterol - Normal cholesterol results. Currently not on statin therapy - The 10-year ASCVD risk score Denman George DC Jr., et al., 2013) is: 5%  7. Vitamin D - 36 normal range.  Consider orthopedics and possible PT regimen and home exercises  Please schedule a Follow-up Appointment to: Return in about 6 weeks (around 11/26/2020) for 6 week follow-up lower extremity weakness, swelling, reduced walking.  If you have any other questions or concerns, please feel free to call the office or send a message through MyChart. You may also schedule an earlier appointment if necessary.  Additionally, you may be receiving a survey about your experience at our office within a few days to 1 week by e-mail or mail. We value your feedback.  Saralyn Pilar, DO [facility], [place]

## 2020-10-15 NOTE — Progress Notes (Signed)
Subjective:    Patient ID: Andres [name], male    DOB: 06/15/1966, 54 y.o.   MRN: [id number]  Andres [name] is a 54 y.o. male presenting on 10/15/2020 for Annual Exam   HPI   Here for Annual Physical and Lab Review.  FOLLOW-UP Hypothyroidism / BMI >25 / Weight last lab showed TSH 4.47 and normal Free T4 (09/2020) Continues on Levothyroxine , doing well without problems. Regarding weight, he has lost 3 lbs in 6 months Good appetite, and regular diet. Limits sweets He is active outdoors - Denies any new complaints, abnormal temperature changes, nausea vomiting, fever chills sweats  Gout, chronic No recent flares, has done well. He had been on Allopurinol for a while at higher dose. Uric acid 5.5 (09/2020) slight increase after his allopurinol dose was lowered but doing very well He continues Allopurinol  daily, tolerating well. No recent gout flares  CHRONIC HTN: Reportsdoing well, normal BP Current Meds -no regular meds Denies CP, dyspnea, HA, edema, dizziness / lightheadedness  Schizoaffective Disorder / Drug Induced Parkinsons Followed by Psychiatry Managed on current medications. Last visit 12/2019, they adjusted Trazodone from 50 up to  nightly PRN for insomnia that has helped. Recent virtual visit with Dr Elna Breslow. She did increase his medications with Trazodone and Citalopram.  Appetite is good, often will eat too much  Additional concern:  He had visit to go to [facility] for routine consultation on 08/07/20. Since that date and when he left and the next day he has had difficulty walking. He has stopped getting up in AM to do chores and walk, he is no longer going out with friend to go to [employer] and walk.  Loss of Balance / History of R hip fracture s/p surgery History of skilled nursing, prior broken R hip, prior broken ankles. He had used walker before He declines any joint pain. Occasionally may have some knee pain at times if laying in bed  especially.  Previously at [facility], now at home, had PT OT. Improving, using walker. Still has issues with balance, requesting [facility] PT, previously helpful with his father and they would be able to help him with balance. Using walker. Making progress  Health Maintenance: Due for COVID19 vaccine, interested to pursue.  [facility] do not have external result available at this time, family does not recall any problem identified they believe there was no polyp found  Colonoscopy was cancelled. Due to patient's stress.  Due for Flu Shot, will receive today    Depression screen [facility] 2/9 04/09/2020 10/10/2019 02/19/2019  Decreased Interest 0 0 0  Down, Depressed, Hopeless 0 0 0  PHQ - 2 Score 0 0 0  Altered sleeping 0 - -  Tired, decreased energy 0 - -  Change in appetite 0 - -  Feeling bad or failure about yourself  0 - -  Trouble concentrating 0 - -  Moving slowly or fidgety/restless 0 - -  Suicidal thoughts 0 - -  PHQ-9 Score 0 - -  Difficult doing work/chores Not difficult at all - -    Past Medical History:  Diagnosis Date  . Allergy    Past Surgical History:  Procedure Laterality Date  . ANKLE SURGERY    . COLON SURGERY    . HERNIA REPAIR    . INTRAMEDULLARY (IM) NAIL INTERTROCHANTERIC Right 06/18/2019   Procedure: INTRAMEDULLARY (IM) NAIL INTERTROCHANTRIC;  Surgeon: Lyndle Herrlich, MD;  Location: [facility] ORS;  Service: Orthopedics;  Laterality: Right;  . TONSILLECTOMY     Social History   Socioeconomic History  . Marital status: Single    Spouse name: Not on file  . Number of children: 0  . Years of education: [employer]  . Highest education level: High school graduate  Occupational History    Comment: disabled  Tobacco Use  . Smoking status: Never Smoker  . Smokeless tobacco: Never Used  Vaping Use  . Vaping Use: Never used  Substance and Sexual Activity  . Alcohol use: No  . Drug use: No  . Sexual  activity: Not Currently  Other Topics Concern  . Not on file  Social History Narrative   Lives with parents, mental handicap, uses walker   Social Determinants of Health   Financial Resource Strain:   . Difficulty of Paying Living Expenses: Not on file  Food Insecurity:   . Worried About Programme researcher, broadcasting/film/video in the Last Year: Not on file  . Ran Out of Food in the Last Year: Not on file  Transportation Needs:   . Lack of Transportation (Medical): Not on file  . Lack of Transportation (Non-Medical): Not on file  Physical Activity:   . Days of Exercise per Week: Not on file  . Minutes of Exercise per Session: Not on file  Stress:   . Feeling of Stress : Not on file  Social Connections:   . Frequency of Communication with Friends and Family: Not on file  . Frequency of Social Gatherings with Friends and Family: Not on file  . Attends Religious Services: Not on file  . Active Member of Clubs or Organizations: Not on file  . Attends Banker Meetings: Not on file  . Marital Status: Not on file  Intimate Partner Violence:   . Fear of Current or Ex-Partner: Not on file  . Emotionally Abused: Not on file  . Physically Abused: Not on file  . Sexually Abused: Not on file   Family History  Problem Relation Age of Onset  . Diabetes Mother   . Stroke Mother   . Cancer Father        colon  . Colon cancer Father   . Colon cancer Other   . Mental illness Neg Hx    Current Outpatient Medications on File Prior to Visit  Medication Sig  . cholecalciferol (VITAMIN D) 1000 units tablet Take 1,000 Units by mouth daily.  . citalopram (CELEXA) 20 MG tablet Take 1 tablet (20 mg total) by mouth daily.  . fexofenadine (ALLEGRA) 180 MG tablet Take 180 mg by mouth.  . propranolol (INDERAL) 10 MG tablet Take 1 tablet (10 mg total) by mouth 2 (two) times daily as needed. For severe anxiety/agitation  . QUEtiapine (SEROQUEL) 25 MG tablet Take 1 tablet (25 mg total) by mouth at bedtime.    . thiamine (VITAMIN B-1) 100 MG tablet Take 100 mg by mouth daily.  . traZODone (DESYREL) 100 MG tablet Take 1 tablet (100 mg total) by mouth at bedtime.  Marcie Bal Tosylate (INGREZZA) 80 MG CAPS Take 1 capsule by mouth at bedtime.   No current facility-administered medications on file prior to visit.    Review of Systems  Constitutional: Negative for activity change, appetite change, chills, diaphoresis, fatigue and fever.  HENT: Negative for congestion and hearing loss.   Eyes: Negative for discharge and visual disturbance.  Respiratory: Negative for cough, chest tightness, shortness of breath and wheezing.   Cardiovascular: Negative for  chest pain, palpitations and leg swelling.  Gastrointestinal: Negative for abdominal pain, anal bleeding, blood in stool, constipation, diarrhea, nausea and vomiting.  Endocrine: Negative for cold intolerance.  Genitourinary: Negative for decreased urine volume, dysuria, frequency and hematuria.  Musculoskeletal: Negative for arthralgias, back pain and neck pain.  Skin: Negative for rash.  Allergic/Immunologic: Negative for environmental allergies.  Neurological: Negative for dizziness, weakness, light-headedness, numbness and headaches.  Hematological: Negative for adenopathy.  Psychiatric/Behavioral: Negative for behavioral problems, dysphoric mood and sleep disturbance. The patient is not nervous/anxious.    Per HPI unless specifically indicated above      Objective:    BP 118/62 (BP Location: Left Arm, Patient Position: Sitting, Cuff Size: Normal)   Pulse (!) 54   Temp (!) 97.1 F (36.2 C) (Temporal)   Resp 16   Ht 5' 5.5" (1.664 m)   Wt 153 lb 12.8 oz (69.8 kg)   SpO2 100%   BMI 25.20 kg/m   Wt Readings from Last 3 Encounters:  10/15/20 153 lb 12.8 oz (69.8 kg)  04/09/20 156 lb (70.8 kg)  10/10/19 149 lb (67.6 kg)    Physical Exam Vitals and nursing note reviewed.  Constitutional:      General: He is not in acute  distress.    Appearance: He is well-developed. He is not diaphoretic.     Comments: Well-appearing, comfortable, cooperative  HENT:     Head: Normocephalic and atraumatic.  Eyes:     General:        Right eye: No discharge.        Left eye: No discharge.     Conjunctiva/sclera: Conjunctivae normal.     Pupils: Pupils are equal, round, and reactive to light.  Neck:     Thyroid: No thyromegaly.  Cardiovascular:     Rate and Rhythm: Normal rate and regular rhythm.     Heart sounds: Normal heart sounds. No murmur heard.   Pulmonary:     Effort: Pulmonary effort is normal. No respiratory distress.     Breath sounds: Normal breath sounds. No wheezing or rales.  Abdominal:     General: Bowel sounds are normal. There is no distension.     Palpations: Abdomen is soft. There is no mass.     Tenderness: There is no abdominal tenderness.  Musculoskeletal:        General: No tenderness.     Cervical back: Normal range of motion and neck supple.     Right lower leg: Edema (stasis dermatitis) present.     Left lower leg: Edema (worse than R. old surgical scar. stasis dermatitis) present.     Comments: Upper / Lower Extremities: - Normal muscle tone, strength bilateral upper extremities 5/5, lower extremities 5/5  Intact bilateral hip flexion and extension, intact knee range of motion bilateral flex / ext. Slight reduced internal rotation R hip s/p repair, Left hip has good rotation. Able to stand from seated. Uses walker for ambulation.  Lymphadenopathy:     Cervical: No cervical adenopathy.  Skin:    General: Skin is warm and dry.     Findings: No erythema or rash.  Neurological:     Mental Status: He is alert and oriented to person, place, and time.     Comments: Distal sensation intact to light touch all extremities  Psychiatric:        Behavior: Behavior normal.     Comments: Well groomed, good eye contact, normal speech and thoughts  Results for orders placed or performed  in visit on 10/07/20  VITAMIN D 25 Hydroxy (Vit-D Deficiency, Fractures)  Result Value Ref Range   Vit D, 25-Hydroxy 36 30 - 100 ng/mL  TSH  Result Value Ref Range   TSH 4.47 0.40 - 4.50 mIU/L  T4, free  Result Value Ref Range   Free T4 1.4 0.8 - 1.8 ng/dL  Uric acid  Result Value Ref Range   Uric Acid, Serum 5.5 4.0 - 8.0 mg/dL  PSA  Result Value Ref Range   PSA 0.63 < OR = 4.0 ng/mL  Lipid panel  Result Value Ref Range   Cholesterol 143 <200 mg/dL   HDL 46 > OR = 40 mg/dL   Triglycerides 269 <485 mg/dL   LDL Cholesterol (Calc) 79 mg/dL (calc)   Total CHOL/HDL Ratio 3.1 <5.0 (calc)   Non-HDL Cholesterol (Calc) 97 <462 mg/dL (calc)  COMPLETE METABOLIC PANEL WITH GFR  Result Value Ref Range   Glucose, Bld 77 65 - 99 mg/dL   BUN 18 7 - 25 mg/dL   Creat 7.03 5.00 - 9.38 mg/dL   GFR, Est Non African American 81 > OR = 60 mL/min/1.26m2   GFR, Est African American 94 > OR = 60 mL/min/1.14m2   BUN/Creatinine Ratio NOT APPLICABLE 6 - 22 (calc)   Sodium 145 135 - 146 mmol/L   Potassium 3.7 3.5 - 5.3 mmol/L   Chloride 109 98 - 110 mmol/L   CO2 26 20 - 32 mmol/L   Calcium 9.3 8.6 - 10.3 mg/dL   Total Protein 6.1 6.1 - 8.1 g/dL   Albumin 4.2 3.6 - 5.1 g/dL   Globulin 1.9 1.9 - 3.7 g/dL (calc)   AG Ratio 2.2 1.0 - 2.5 (calc)   Total Bilirubin 0.7 0.2 - 1.2 mg/dL   Alkaline phosphatase (APISO) 72 35 - 144 U/L   AST 13 10 - 35 U/L   ALT 13 9 - 46 U/L  CBC with Differential/Platelet  Result Value Ref Range   WBC 4.6 3.8 - 10.8 Thousand/uL   RBC 5.08 4.20 - 5.80 Million/uL   Hemoglobin 15.8 13.2 - 17.1 g/dL   HCT 18.2 38 - 50 %   MCV 92.5 80.0 - 100.0 fL   MCH 31.1 27.0 - 33.0 pg   MCHC 33.6 32.0 - 36.0 g/dL   RDW 99.3 71.6 - 96.7 %   Platelets 122 (L) 140 - 400 Thousand/uL   MPV 11.7 7.5 - 12.5 fL   Neutro Abs 2,594 1,500 - 7,800 cells/uL   Lymphs Abs 1,348 850 - 3,900 cells/uL   Absolute Monocytes 414 200 - 950 cells/uL   Eosinophils Absolute 212 15.0 - 500.0 cells/uL    Basophils Absolute 32 0.0 - 200.0 cells/uL   Neutrophils Relative % 56.4 %   Total Lymphocyte 29.3 %   Monocytes Relative 9.0 %   Eosinophils Relative 4.6 %   Basophils Relative 0.7 %   Smear Review        Assessment & Plan:   Problem List Items Addressed This Visit    Schizoaffective disorder, depressive type (HCC)    Stable chronic problem Managed by Psychiatry On med management      Hypothyroidism    Stable, controlled now normalized on levothyroxine low dose  Plan Continue current Levothyroxine daily (Brand Euthyrox) - no dose change       Relevant Medications   levothyroxine (SYNTHROID) 25 MCG tablet   Essential hypertension    Controlled HTN off medication  currently Not checking BP regularly    Plan:  1. Remain off meds for now 2. Encourage improved lifestyle - low sodium diet, regular exercise 3. May try to monitor BP outside office, bring readings to next visit, if persistently >140/90 or new symptoms notify office sooner      Chronic tophaceous gout    Well controlled on prophylaxis Uric acid 5.5 on 100mg  Prior dose 300mg  allopurinol  Plan Continue current Allopurinol 100mg  daily, discussed benefit risk, agree to keep on low dose to prevent gout Future if Uric acid remains very well controlled we can consider DC med but defer for now      Relevant Medications   allopurinol (ZYLOPRIM) 100 MG tablet    Other Visit Diagnoses    Annual physical exam    -  Primary   Needs flu shot       Relevant Orders   Flu Vaccine QUAD 6+ mos PF IM (Fluarix Quad PF) (Completed)      Updated Health Maintenance information - Flu Shot today Reviewed recent lab results with patient Encouraged improvement to lifestyle with diet and exercise  #Lower Extremity Weakness bilateral Uncertain exact etiology seems to be unwilling to do regular activities and walking less, seems to be related to several factors possibly deconditioning if initially was inactive for  period of time, possibly due to stressors. - He has known osteoarthritis joint pain and hip issues. Also has occasional complaints of knee and lower extremity ankle - Not consistent with gout flare - No other acute injury  LE edema present but no new changes  Future consider Orthopedic eval vs PT eval  Meds ordered this encounter  Medications  . levothyroxine (SYNTHROID) 25 MCG tablet    Sig: TAKE 1 TABLET BY MOUTH DAILY BEFORE BREAKFAST ON AN EMPTY STOMACH( NOT TO TAKE WITH OTHER MEDICINES)    Dispense:  90 tablet    Refill:  3  . allopurinol (ZYLOPRIM) 100 MG tablet    Sig: Take 1 tablet (100 mg total) by mouth daily.    Dispense:  90 tablet    Refill:  3      Follow up plan: Return in about 6 weeks (around 11/26/2020) for 6 week follow-up lower extremity weakness, swelling, reduced walking.  Saralyn PilarAlexander Luisenrique Conran, DO [facility] 10/15/2020, 8:50 AM

## 2020-10-16 NOTE — Assessment & Plan Note (Signed)
Well controlled on prophylaxis Uric acid 5.5 on 100mg  Prior dose 300mg  allopurinol  Plan Continue current Allopurinol 100mg  daily, discussed benefit risk, agree to keep on low dose to prevent gout Future if Uric acid remains very well controlled we can consider DC med but defer for now

## 2020-10-16 NOTE — Assessment & Plan Note (Signed)
Controlled HTN off medication currently Not checking BP regularly    Plan:  1. Remain off meds for now 2. Encourage improved lifestyle - low sodium diet, regular exercise 3. May try to monitor BP outside office, bring readings to next visit, if persistently >140/90 or new symptoms notify office sooner

## 2020-10-16 NOTE — Assessment & Plan Note (Signed)
Stable, controlled now normalized on levothyroxine low dose  Plan Continue current Levothyroxine daily (Brand Euthyrox) - no dose change

## 2020-10-16 NOTE — Assessment & Plan Note (Signed)
Stable chronic problem Managed by Psychiatry On med management

## 2020-10-27 ENCOUNTER — Ambulatory Visit (INDEPENDENT_AMBULATORY_CARE_PROVIDER_SITE_OTHER): Payer: Medicare Other | Admitting: Licensed Clinical Social Worker

## 2020-10-27 ENCOUNTER — Other Ambulatory Visit: Payer: Self-pay

## 2020-10-27 DIAGNOSIS — F70 Mild intellectual disabilities: Secondary | ICD-10-CM | POA: Diagnosis not present

## 2020-10-27 DIAGNOSIS — F251 Schizoaffective disorder, depressive type: Secondary | ICD-10-CM

## 2020-10-27 DIAGNOSIS — F419 Anxiety disorder, unspecified: Secondary | ICD-10-CM

## 2020-10-27 NOTE — Progress Notes (Signed)
Virtual Visit via Video Note  I connected with Burgess Sheriff Dorion on 10/27/20 at  9:00 AM EDT by a video enabled telemedicine application and verified that I am speaking with the correct person using two identifiers.  Location: Patient: home Provider: [facility]   I discussed the limitations of evaluation and management by telemedicine and the availability of in person appointments. The patient expressed understanding and agreed to proceed.   I discussed the assessment and treatment plan with the patient. The patient was provided an opportunity to ask questions and all were answered. The patient agreed with the plan and demonstrated an understanding of the instructions.   The patient was advised to call back or seek an in-person evaluation if the symptoms worsen or if the condition fails to improve as anticipated.  I provided 60 minutes of non-face-to-face time during this encounter.   Zac Torti R Katanya Schlie, LCSW    THERAPIST PROGRESS NOTE  Session Time: 9:00-10:00a  Participation Level: Active  Behavioral Response: Casual and NeatAlertAnxious  Type of Therapy: Individual Therapy  Treatment Goals addressed: Coping  Interventions: Supportive  Summary: ABSHIR PAOLINI is a 54 y.o. male who presents with diagnoses (anxiety, schizoaffective disorder, ID mild). Pt was accompanied by sister throughout the session, who often would answer questions directly asked to pt.  Pt and sister report that he is compliant with medication, gets good quality and quantity of sleep, and has a moderate degree of overall life stress.  Allowed pt and sister opportunity to explore and express thoughts and feelings related to external stressors. Pts sister feels that pt has continued to be distressed after the passing of his mother and living one on one with his father (they have differing personalities).    Pt expressed throughout the session that he is upset that he cannot walk without a walker and that he can't "get  the devil out of me".  Pt is very religious and talked about God and the devil throughout the session. Pt also disclosed scenarios where he would be laying unclothed with women (his sister).  Pts sister dismissed the conversation and would change the topic, but pt continued to bring up the same scenarios and state the "I'm not strong enough to make the devil go away by myself".  Pt continued to ruminate about sexual topics throughout session.  Pt has caregivers that come into the home and help pt with cooking, cleaning, and self-care. Pt reports that he has discussed his feelings with one of the caregivers because he feels an attraction to her sometimes. Discussed appropriateness of making sexual comments to friends/family/caregivers. Helped normalize overall concept of human sexual urges to pt and sister.  Encouraged pt to identify positive activities that make him feel happy. Pt has physical limitations--physical activity ability is limited. Pt is not allowed to watch his favorite shows on TV except in the afternoons when his father will watch westerns with him.  At close/review of session, discussed counseling/treatment plan with sister and allowed her to express her thoughts about the session. Pts sister disclosed that her brother had expressed to her that he was having "those kind of" thoughts about her around a year ago. Pts sister feels its because their mother passed away, and that she is the oldest sibling and is one of his primary caregivers. Pts sister disclosed that he has never told anyone any of this information before--and I am the only counselor he has ever opened up to.  Flat affect, high anxiety, mild obsessional preoccupation.  Suicidal/Homicidal: No  SI, HI, or AVH reported at time of session.  Therapist Response: Developed treatment plan w/ pt and sister.  Plan: Return again in 3 weeks.The ongoing treatment plan includes maintaining current levels of progress and continuing to  build skills to manage mood, improve stress/anxiety management, emotion regulation, distress tolerance, and behavior modification.   Diagnosis: Axis I: Anxiety Disorder NOS and Schizoaffective Disorder    Axis II: ID mild    Yeng Perz R Bonne Whack, LCSW 10/27/2020

## 2020-11-17 ENCOUNTER — Ambulatory Visit (INDEPENDENT_AMBULATORY_CARE_PROVIDER_SITE_OTHER): Payer: Medicare Other | Admitting: Licensed Clinical Social Worker

## 2020-11-17 ENCOUNTER — Other Ambulatory Visit: Payer: Self-pay

## 2020-11-17 DIAGNOSIS — F419 Anxiety disorder, unspecified: Secondary | ICD-10-CM

## 2020-11-17 DIAGNOSIS — F251 Schizoaffective disorder, depressive type: Secondary | ICD-10-CM | POA: Diagnosis not present

## 2020-11-17 NOTE — Progress Notes (Addendum)
Virtual Visit via Video Note  I connected with Andres [name] on 11/17/20 at 10:00 AM EST by a video enabled telemedicine application and verified that I am speaking with the correct person using two identifiers.  Location: Patient: home Provider: [facility]  Session Participants: Patient--Andres [name] and sister, Andres [name]  Counselor--Andres [name], MSW, LCSW    I discussed the limitations of evaluation and management by telemedicine and the availability of in person appointments. The patient expressed understanding and agreed to proceed.   The patient was advised to call back or seek an in-person evaluation if the symptoms worsen or if the condition fails to improve as anticipated.  I provided 60 minutes of non-face-to-face time during this encounter.   Andres [name] R Andres Badilla, LCSW    THERAPIST PROGRESS NOTE  Session Time: 10:00-11:00a  Participation Level: Active  Behavioral Response: Neat and Well GroomedAlertAnxious  Type of Therapy: Individual Therapy  Treatment Goals addressed: Anxiety and Coping  Interventions: Solution Focused and Supportive  Summary: Andres [name] is a 54 y.o. male who presents with symptoms associated with schizoaffective disorder diagnoses. Pt presents with fragmented thought process, high anxiety, and ruminating about specific areas of interest.   Allowed pt and pts sister to explore and express thoughts and feelings associated with current levels of functioning. Pt reports that he feels better today than he has in a while "I don't know why, but I am just feeling better today". Discussed interventions that patient has tried throughout the past month: pt has been trying to socially engage with others versus staying at home alone most of the time. Pt reports that he enjoys going out and going to stores "It gets me out of the house". Pt is happy that he is moving around better now that his hip is healing from the fracture.   Pt often will jump from  discussing things in the present to scenarios from 20+ years ago. Pt states that he has "learned from the past" which is why he's talking about different reactions from people 20 years ago.   Currently working on pts communication skills--pt expressed that he is often hungry at dinnertime and instead of eating, his father will start on a project or do something else, which triggers frustration in pt. Pt will often not say anything and continue to sit there, getting more and more frustrated at his father while waiting for food. When asked why he doesn't express that he is hungry to his father, pt states that "well he will get mad and make me eat something different, like he did that last time".  "Last time" is alluding to a situation that occurred 20 years ago. LCSW counselor and sister encouraged pt to express his feelings of hunger to his father instead of sitting in silence, getting mad. Pt states "I'll think about it".   Pt is fearful of something happening to his father "I wish the rapture would come because I don't want to see anything bad happen to him"--pt stated this several times throughout the session.   Pt states that the thoughts in his head are from "the devil" and that he "can't get the devil out of my head".  As a CBT-based intervention, encouraged pt and sister to get playing cards and sort black/red. If pt doesn't get frustrated from that, he can match the numbers together. Encouraged pt to continue with outings to stores to reduce current social isolation.    Suicidal/Homicidal: No  Therapist Response: Andres [name] seemed to have less  of an obsessive preoccupation with sin and the devil this session compared to last session, which is indicative of progress. Pt is also able to identify emotions and emotional triggers, which is reflective of progress. Developments are being made with [employer]. Treatment to continue as indicated.   Plan: Return again in 3 weeks. The ongoing  treatment plan includes maintaining current levels of progress and continuing to build skills to manage mood, improve stress/anxiety management, social skills, emotion regulation, distress tolerance, and behavior modification.   Diagnosis: Axis I: Generalized Anxiety Disorder and Schizoaffective Disorder    Axis II: No diagnosis    Andres Haber Lazaro Isenhower, LCSW 11/17/2020

## 2020-12-02 ENCOUNTER — Other Ambulatory Visit: Payer: Self-pay | Admitting: Psychiatry

## 2020-12-02 DIAGNOSIS — G2401 Drug induced subacute dyskinesia: Secondary | ICD-10-CM

## 2020-12-03 ENCOUNTER — Telehealth: Payer: Self-pay

## 2020-12-03 ENCOUNTER — Telehealth (INDEPENDENT_AMBULATORY_CARE_PROVIDER_SITE_OTHER): Payer: Medicare Other | Admitting: Psychiatry

## 2020-12-03 ENCOUNTER — Encounter: Payer: Self-pay | Admitting: Psychiatry

## 2020-12-03 ENCOUNTER — Other Ambulatory Visit: Payer: Self-pay

## 2020-12-03 DIAGNOSIS — G4700 Insomnia, unspecified: Secondary | ICD-10-CM

## 2020-12-03 DIAGNOSIS — F251 Schizoaffective disorder, depressive type: Secondary | ICD-10-CM | POA: Diagnosis not present

## 2020-12-03 DIAGNOSIS — G2401 Drug induced subacute dyskinesia: Secondary | ICD-10-CM | POA: Diagnosis not present

## 2020-12-03 DIAGNOSIS — F5105 Insomnia due to other mental disorder: Secondary | ICD-10-CM | POA: Insufficient documentation

## 2020-12-03 DIAGNOSIS — F70 Mild intellectual disabilities: Secondary | ICD-10-CM | POA: Diagnosis not present

## 2020-12-03 NOTE — Telephone Encounter (Signed)
recieved a fax that a prior auth was needed for the ingrezza

## 2020-12-03 NOTE — Telephone Encounter (Signed)
went online to covermy meds and submitted the prior authorization. - pending

## 2020-12-03 NOTE — Progress Notes (Signed)
Virtual Visit via Telephone Note  I connected with Andres [name] on 12/03/20 at 11:30 AM EST by telephone and verified that I am speaking with the correct person using two identifiers.  Location Provider Location : [facility] Patient Location : Home  Participants: Patient , Lupita Leash ( sister), Provider   I discussed the limitations, risks, security and privacy concerns of performing an evaluation and management service by telephone and the availability of in person appointments. I also discussed with the patient that there may be a patient responsible charge related to this service. The patient expressed understanding and agreed to proceed.   I discussed the assessment and treatment plan with the patient. The patient was provided an opportunity to ask questions and all were answered. The patient agreed with the plan and demonstrated an understanding of the instructions.   The patient was advised to call back or seek an in-person evaluation if the symptoms worsen or if the condition fails to improve as anticipated.   BH MD OP Progress Note  12/03/2020 12:11 PM Andres [name]  MRN:  [id number]  Chief Complaint:  Chief Complaint    Follow-up     HPI: Andres [name] is a 54 year old Caucasian male who has a history of schizoaffective disorder, GAD, hypothyroidism, mild intellectual disability was evaluated by telemedicine today.  Collateral information was obtained from sister-[name]  According to sister-patient is currently doing better with regards to his mood.  He however has been having some sleep problems.  He takes several naps during the day.  Since he is not too tired at the end of the day because of all the naps that he has already taken he does not feel sleepy at night.  His sleep hence at night has been restless.  He does have a caregiver coming 3 days a week at around 8 AM to help fix breakfast and take care of other household chores.  Patient wakes up every day at 5 AM to get himself ready  for breakfast when the caregiver gets there.  This is a regular routine that he has been following for the past several years.  She has not observed any hallucinations, paranoia or delusions.  She has not observed any mood lability.  He is compliant on all her medications.  Denies any side effects.  According to patient he does struggle with excessive sleepiness during the day and his sleep at night is restless.  He does have racing thoughts at night since he is unable to sleep.  Otherwise he denies any other problems at this time.  Patient is currently in psychotherapy sessions with Ms. Christina Hussami and therapy sessions are beneficial.  Patient denies any suicidality or homicidality.  Patient denies any perceptual disturbances.    Visit Diagnosis:    ICD-10-CM   1. Schizoaffective disorder, depressive type (HCC)  F25.1   2. Tardive dyskinesia  G24.01   3. Insomnia, unspecified type  G47.00   4. Mild intellectual disability  F70     Past Psychiatric History: I have reviewed past psychiatric history from my progress note on 03/29/2018.  Past Medical History:  Past Medical History:  Diagnosis Date  . Allergy     Past Surgical History:  Procedure Laterality Date  . ANKLE SURGERY    . COLON SURGERY    . HERNIA REPAIR    . INTRAMEDULLARY (IM) NAIL INTERTROCHANTERIC Right 06/18/2019   Procedure: INTRAMEDULLARY (IM) NAIL INTERTROCHANTRIC;  Surgeon: Lyndle Herrlich, MD;  Location: [facility] ORS;  Service: Orthopedics;  Laterality: Right;  . TONSILLECTOMY      Family Psychiatric History: I have reviewed family psychiatric history from my progress note from 03/29/2018.  Family History:  Family History  Problem Relation Age of Onset  . Diabetes Mother   . Stroke Mother   . Cancer Father        colon  . Colon cancer Father   . Colon cancer Other   . Mental illness Neg Hx     Social History: Reviewed social history from my progress note on 03/29/2018. Social History   Socioeconomic  History  . Marital status: Single    Spouse name: Not on file  . Number of children: 0  . Years of education: [employer]  . Highest education level: High school graduate  Occupational History    Comment: disabled  Tobacco Use  . Smoking status: Never Smoker  . Smokeless tobacco: Never Used  Vaping Use  . Vaping Use: Never used  Substance and Sexual Activity  . Alcohol use: No  . Drug use: No  . Sexual activity: Not Currently  Other Topics Concern  . Not on file  Social History Narrative   Lives with parents, mental handicap, uses walker   Social Determinants of Health   Financial Resource Strain:   . Difficulty of Paying Living Expenses: Not on file  Food Insecurity:   . Worried About Programme researcher, broadcasting/film/video in the Last Year: Not on file  . Ran Out of Food in the Last Year: Not on file  Transportation Needs:   . Lack of Transportation (Medical): Not on file  . Lack of Transportation (Non-Medical): Not on file  Physical Activity:   . Days of Exercise per Week: Not on file  . Minutes of Exercise per Session: Not on file  Stress:   . Feeling of Stress : Not on file  Social Connections:   . Frequency of Communication with Friends and Family: Not on file  . Frequency of Social Gatherings with Friends and Family: Not on file  . Attends Religious Services: Not on file  . Active Member of Clubs or Organizations: Not on file  . Attends Banker Meetings: Not on file  . Marital Status: Not on file    Allergies:  Allergies  Allergen Reactions  . Prednisone Other (See Comments)    Did not like the way it made him feel   . Lamotrigine Rash    Metabolic Disorder Labs: Lab Results  Component Value Date   HGBA1C 4.8 09/24/2019   MPG 91 09/24/2019   MPG 91 10/05/2018   No results found for: PROLACTIN Lab Results  Component Value Date   CHOL 143 10/08/2020   TRIG 101 10/08/2020   HDL 46 10/08/2020   CHOLHDL 3.1 10/08/2020   LDLCALC 79 10/08/2020   LDLCALC  89 09/24/2019   Lab Results  Component Value Date   TSH 4.47 10/08/2020   TSH 4.92 (H) 04/02/2020    Therapeutic Level Labs: No results found for: LITHIUM No results found for: VALPROATE No components found for:  CBMZ  Current Medications: Current Outpatient Medications  Medication Sig Dispense Refill  . allopurinol (ZYLOPRIM) 100 MG tablet Take 1 tablet (100 mg total) by mouth daily. 90 tablet 3  . cholecalciferol (VITAMIN D) 1000 units tablet Take 1,000 Units by mouth daily.    . citalopram (CELEXA) 20 MG tablet Take 1 tablet (20 mg total) by mouth daily. 90 tablet 1  . fexofenadine (  ALLEGRA) 180 MG tablet Take 180 mg by mouth.    . levothyroxine (SYNTHROID) 25 MCG tablet TAKE 1 TABLET BY MOUTH DAILY BEFORE BREAKFAST ON AN EMPTY STOMACH( NOT TO TAKE WITH OTHER MEDICINES) 90 tablet 3  . propranolol (INDERAL) 10 MG tablet Take 1 tablet (10 mg total) by mouth 2 (two) times daily as needed. For severe anxiety/agitation 180 tablet 2  . QUEtiapine (SEROQUEL) 25 MG tablet Take 1 tablet (25 mg total) by mouth at bedtime. 90 tablet 2  . thiamine (VITAMIN B-1) 100 MG tablet Take 100 mg by mouth daily.    . traZODone (DESYREL) 100 MG tablet Take 1 tablet (100 mg total) by mouth at bedtime. 90 tablet 1  . Valbenazine Tosylate (INGREZZA) 80 MG CAPS Take 1 capsule by mouth at bedtime. 30 capsule 7   No current facility-administered medications for this visit.     Musculoskeletal: Strength & Muscle Tone: UTA Gait & Station: UTA Patient leans: N/A  Psychiatric Specialty Exam: Review of Systems  Psychiatric/Behavioral: Positive for sleep disturbance. The patient is nervous/anxious.   All other systems reviewed and are negative.   There were no vitals taken for this visit.There is no height or weight on file to calculate BMI.  General Appearance: UTA  Eye Contact:  UTA  Speech:  Clear and Coherent  Volume:  Normal  Mood:  Anxious coping well  Affect:  UTA  Thought Process:  Goal  Directed and Descriptions of Associations: Intact  Orientation:  Full (Time, Place, and Person)  Thought Content: Logical   Suicidal Thoughts:  No  Homicidal Thoughts:  No  Memory:  Immediate;   Fair Recent;   Fair Remote;   Fair  Judgement:  Fair  Insight:  Fair  Psychomotor Activity:  UTA  Concentration:  Concentration: Fair and Attention Span: Fair  Recall:  [employer]Fair  Fund of Knowledge: Fair  Language: Fair  Akathisia:  No  Handed:  Right  AIMS (if indicated):UTA  Assets:  Communication Skills Desire for Improvement Housing Social Support  ADL's:  Intact  Cognition: WNL  Sleep:  Poor at night , excessive during day   Screenings: AIMS     [facility] Visit from [facility]/07/2018 [facility]  AIMS Total Score 5 8 8 8 9     GAD-7     [facility]  Total GAD-7 Score 15    PHQ2-9     [facility] Office [facility] Office Visit from [facility]  PHQ-2 Total Score 0 0 0 2  PHQ-9 Total Score 0 -- -- 10       Assessment and Plan: Andres [name] is a 54 year old Caucasian male who has a history of schizoaffective disorder, diabetes, GAD, hypothyroidism was evaluated by telemedicine today.  Patient currently struggles with excessive sleepiness during the day and sleep problems at night.  Discussed plan as noted below.  Plan Schizoaffective disorder-improving Seroquel 25 mg p.o. nightly Trazodone 100 mg p.o. nightly as needed Celexa 20 mg p.o. daily Propranolol  10 mg p.o. daily as needed for severe anxiety agitation.  Insomnia-unstable Patient currently does  not have a good sleep hygiene and tends to sleep during the day and has sleep problems at night. Sister agrees to work with patient to have a better sleep hygiene. Also discussed to let patient stay in bed at least till 6 AM instead of 5 AM every day.  Discussed to reduce the amount of naps taken during the day. In spite of making all these changes if he is unable to sleep at night, will consider making medication changes.   TD-stable Ingrezza 80 mg p.o. daily  Continue CBT.  Patient as well as sister to discuss with therapist about community resources available for patient to engage during the week.  Collateral information was obtained from sister-[name] as summarized above.  Follow-up in clinic in 1 month or sooner if needed.  I have spent atleast 20 minutes non face to face with patient today. More than 50 % of the time was spent for preparing to see the patient ( e.g., review of test, records ),  ordering medications and test ,psychoeducation and supportive psychotherapy and care coordination,as well as documenting clinical information in electronic health record. This note was generated in part or whole with voice recognition software. Voice recognition is usually quite accurate but there are transcription errors that can and very often do occur. I apologize for any typographical errors that were not detected and corrected.       Jomarie Longs, MD 12/03/2020, 12:11 PM

## 2020-12-03 NOTE — Telephone Encounter (Signed)
ingrezza 80mg  was approved through 12-26-21

## 2020-12-03 NOTE — Telephone Encounter (Signed)
faxed and confirmed approval notice sent to [place]

## 2020-12-04 ENCOUNTER — Encounter: Payer: Self-pay | Admitting: Family Medicine

## 2020-12-04 ENCOUNTER — Ambulatory Visit (INDEPENDENT_AMBULATORY_CARE_PROVIDER_SITE_OTHER): Payer: Medicare Other | Admitting: Family Medicine

## 2020-12-04 VITALS — BP 121/83 | HR 53 | Ht 65.5 in | Wt 156.0 lb

## 2020-12-04 DIAGNOSIS — Z8781 Personal history of (healed) traumatic fracture: Secondary | ICD-10-CM | POA: Diagnosis not present

## 2020-12-04 DIAGNOSIS — R2689 Other abnormalities of gait and mobility: Secondary | ICD-10-CM | POA: Diagnosis not present

## 2020-12-04 DIAGNOSIS — R269 Unspecified abnormalities of gait and mobility: Secondary | ICD-10-CM | POA: Diagnosis not present

## 2020-12-04 DIAGNOSIS — R29898 Other symptoms and signs involving the musculoskeletal system: Secondary | ICD-10-CM | POA: Diagnosis not present

## 2020-12-04 DIAGNOSIS — I872 Venous insufficiency (chronic) (peripheral): Secondary | ICD-10-CM

## 2020-12-04 DIAGNOSIS — R600 Localized edema: Secondary | ICD-10-CM

## 2020-12-04 NOTE — Progress Notes (Signed)
Subjective:    Patient ID: Andres [name], male    DOB: 11-16-1966, 54 y.o.   MRN: [id number]  Andres [name] is a 54 y.o. male presenting on 12/04/2020 for Edema (Follow up - LE weakness, reduced walking.)   HPI    Gout, chronic No recent flares, has done well. He had been on Allopurinol for a while at higher dose. Uric acid 5.5 (09/2020) slight increase after his allopurinol dose was lowered but doing very well He continues Allopurinol 100mg  daily, tolerating well. No recent gout flares  Schizoaffective Disorder / Drug Induced Parkinsons Followed by Psychiatry Managed on current medications.Last visit 12/2019, they adjusted Trazodone from 50 up to 100mg  nightly PRN for insomnia that has helped. Recent virtual visit with Dr 01/2020. She did increase his medications with Trazodone and Citalopram.  DIFIFICULTY WALKING / GAIT PROBLEM / BALANCE ISSUE LEFT HIP  He had visit to go to [facility] for routine consultation on 08/07/20. Since that date and when he left and the next day he has had difficulty walking. He has stopped getting up in AM to do chores and walk, he is no longer going out with friend to go to [employer] and walk.  Loss of Balance / History of R hip fracture s/p surgery (June 2020 - Dr 10/07/20 at [facility]) History of skilled nursing, prior broken R hip, prior broken ankles. He had used walker before He declines any joint pain. Occasionally may have some knee pain at times if laying in bed especially.  Previously at [facility], now at home, had PT OT. Improving, using walker. Still has issues with balance, requesting [facility] PT, previously helpful with his father and they would be able to help him with balance. Using walker. Making progress  Today still has difficulty with his gait, is not back to normal.   #Lower Extremity Edema Complaint of persistent swelling in lower extremities.  Depression screen [facility] 2/9 12/04/2020 04/09/2020 10/10/2019   Decreased Interest 0 0 0  Down, Depressed, Hopeless 0 0 0  PHQ - 2 Score 0 0 0  Altered sleeping - 0 -  Tired, decreased energy - 0 -  Change in appetite - 0 -  Feeling bad or failure about yourself  - 0 -  Trouble concentrating - 0 -  Moving slowly or fidgety/restless - 0 -  Suicidal thoughts - 0 -  PHQ-9 Score - 0 -  Difficult doing work/chores - Not difficult at all -    Social History   Tobacco Use  . Smoking status: Never Smoker  . Smokeless tobacco: Never Used  Vaping Use  . Vaping Use: Never used  Substance Use Topics  . Alcohol use: No  . Drug use: No    Review of Systems Per HPI unless specifically indicated above     Objective:    BP 121/83   Pulse (!) 53   Ht 5' 5.5" (1.664 m)   Wt 156 lb (70.8 kg)   SpO2 100%   BMI 25.56 kg/m   Wt Readings from Last 3 Encounters:  12/04/20 156 lb (70.8 kg)  10/15/20 153 lb 12.8 oz (69.8 kg)  04/09/20 156 lb (70.8 kg)    Physical Exam Vitals and nursing note reviewed.  Constitutional:      General: He is not in acute distress.    Appearance: He is well-developed and well-nourished. He is not diaphoretic.     Comments: Well-appearing, comfortable, cooperative  HENT:  Head: Normocephalic and atraumatic.     Mouth/Throat:     Mouth: Oropharynx is clear and moist.  Eyes:     General:        Right eye: No discharge.        Left eye: No discharge.     Conjunctiva/sclera: Conjunctivae normal.  Cardiovascular:     Rate and Rhythm: Normal rate.  Pulmonary:     Effort: Pulmonary effort is normal.  Musculoskeletal:        General: No edema.     Comments: Patient able to stand from seated without assistance but has evidence of proximal muscle weakness, he is relying on R lower extremity to stand. Walking short distance he has antalgic gait limited by mobility of Left Hip and lower extremity. Muscle strength is non focal and intact but limited by participation in exam.  Skin:    General: Skin is warm and dry.      Findings: No erythema or rash.  Neurological:     Mental Status: He is alert and oriented to person, place, and time.  Psychiatric:        Mood and Affect: Mood and affect normal.        Behavior: Behavior normal.     Comments: Well groomed, good eye contact, normal speech and thoughts    Results for orders placed or performed in visit on 10/07/20  VITAMIN D 25 Hydroxy (Vit-D Deficiency, Fractures)  Result Value Ref Range   Vit D, 25-Hydroxy 36 30 - 100 ng/mL  TSH  Result Value Ref Range   TSH 4.47 0.40 - 4.50 mIU/L  T4, free  Result Value Ref Range   Free T4 1.4 0.8 - 1.8 ng/dL  Uric acid  Result Value Ref Range   Uric Acid, Serum 5.5 4.0 - 8.0 mg/dL  PSA  Result Value Ref Range   PSA 0.63 < OR = 4.0 ng/mL  Lipid panel  Result Value Ref Range   Cholesterol 143 <200 mg/dL   HDL 46 > OR = 40 mg/dL   Triglycerides 546 <503 mg/dL   LDL Cholesterol (Calc) 79 mg/dL (calc)   Total CHOL/HDL Ratio 3.1 <5.0 (calc)   Non-HDL Cholesterol (Calc) 97 <546 mg/dL (calc)  COMPLETE METABOLIC PANEL WITH GFR  Result Value Ref Range   Glucose, Bld 77 65 - 99 mg/dL   BUN 18 7 - 25 mg/dL   Creat 5.68 1.27 - 5.17 mg/dL   GFR, Est Non African American 81 > OR = 60 mL/min/1.48m2   GFR, Est African American 94 > OR = 60 mL/min/1.36m2   BUN/Creatinine Ratio NOT APPLICABLE 6 - 22 (calc)   Sodium 145 135 - 146 mmol/L   Potassium 3.7 3.5 - 5.3 mmol/L   Chloride 109 98 - 110 mmol/L   CO2 26 20 - 32 mmol/L   Calcium 9.3 8.6 - 10.3 mg/dL   Total Protein 6.1 6.1 - 8.1 g/dL   Albumin 4.2 3.6 - 5.1 g/dL   Globulin 1.9 1.9 - 3.7 g/dL (calc)   AG Ratio 2.2 1.0 - 2.5 (calc)   Total Bilirubin 0.7 0.2 - 1.2 mg/dL   Alkaline phosphatase (APISO) 72 35 - 144 U/L   AST 13 10 - 35 U/L   ALT 13 9 - 46 U/L  CBC with Differential/Platelet  Result Value Ref Range   WBC 4.6 3.8 - 10.8 Thousand/uL   RBC 5.08 4.20 - 5.80 Million/uL   Hemoglobin 15.8 13.2 - 17.1 g/dL   HCT 47.0  38.5 - 50.0 %   MCV 92.5 80.0 -  100.0 fL   MCH 31.1 27.0 - 33.0 pg   MCHC 33.6 32.0 - 36.0 g/dL   RDW 70.6 23.7 - 62.8 %   Platelets 122 (L) 140 - 400 Thousand/uL   MPV 11.7 7.5 - 12.5 fL   Neutro Abs 2,594 1,500 - 7,800 cells/uL   Lymphs Abs 1,348 850 - 3,900 cells/uL   Absolute Monocytes 414 200 - 950 cells/uL   Eosinophils Absolute 212 15 - 500 cells/uL   Basophils Absolute 32 0 - 200 cells/uL   Neutrophils Relative % 56.4 %   Total Lymphocyte 29.3 %   Monocytes Relative 9.0 %   Eosinophils Relative 4.6 %   Basophils Relative 0.7 %   Smear Review        Assessment & Plan:   Problem List Items Addressed This Visit   None   Visit Diagnoses    Abnormal gait    -  Primary   Relevant Orders   Ambulatory referral to Orthopedic Surgery   Misalignment of left hip       Relevant Orders   Ambulatory referral to Orthopedic Surgery   S/P right hip fracture       Relevant Orders   Ambulatory referral to Orthopedic Surgery   Left leg weakness       Relevant Orders   Ambulatory referral to Orthopedic Surgery   Loss of balance       Relevant Orders   Ambulatory referral to Orthopedic Surgery   Bilateral lower extremity edema       Relevant Orders   Ambulatory referral to Vascular Surgery   Venous insufficiency       Relevant Orders   Ambulatory referral to Vascular Surgery      #LE Edema, Venous insufficiency Chronic edema with dry skin dermatitis and some darkening of skin lower ankles Without pain or other problem, however concerning to patient's family Will refer to Vascular for further evaluation of lower ext circulation and assistance with edema.  #Abnormal Gait S/p R hip fracture repair 05/2019 Now with L hip difficulty, seems possible misalignment or leg length issue, he is not endorsing pain but has worsening abnormal gait mechanic.  Referral back to [facility] Dr Odis Luster for re-evaluation of structural aspect of both hips and then if indicated would pursue PT guided program may need  strengthening PT course. - Next if unable to resolve issue with gait and balance, he would likely benefit from PM&R rehab specialty  No orders of the defined types were placed in this encounter.    Follow up plan: Return if symptoms worsen or fail to improve.   Saralyn Pilar, DO [facility] 12/04/2020, 9:46 AM

## 2020-12-04 NOTE — Patient Instructions (Addendum)
Thank you for coming to the office today.  [facility] ([facility] Address: [address] [place], [place], [place] [address] Hours:  9AM-5PM Phone: [phone]  Dr Odis Luster  ---------------  May need future physical rehab appointment in future.   [facility] [address] [place], [place] [address]  Main: [phone]     Please schedule a Follow-up Appointment to: Return if symptoms worsen or fail to improve.  If you have any other questions or concerns, please feel free to call the office or send a message through MyChart. You may also schedule an earlier appointment if necessary.  Additionally, you may be receiving a survey about your experience at our office within a few days to 1 week by e-mail or mail. We value your feedback.  Saralyn Pilar, DO [facility] [place]

## 2020-12-08 ENCOUNTER — Other Ambulatory Visit: Payer: Self-pay

## 2020-12-08 ENCOUNTER — Ambulatory Visit (INDEPENDENT_AMBULATORY_CARE_PROVIDER_SITE_OTHER): Payer: Medicare Other | Admitting: Licensed Clinical Social Worker

## 2020-12-08 DIAGNOSIS — G4700 Insomnia, unspecified: Secondary | ICD-10-CM

## 2020-12-08 DIAGNOSIS — F251 Schizoaffective disorder, depressive type: Secondary | ICD-10-CM

## 2020-12-08 NOTE — Progress Notes (Signed)
Virtual Visit via Video Note  I connected with Andres [name] and sister Andres [name] on 12/08/20 at  8:00 AM EST by a video enabled telemedicine application and verified that I am speaking with the correct person using two identifiers.  Video connection was lost when less than 50% of the duration of the visit was complete, at which time the remainder of the visit was completed via audio only.   Location: Patient: home Provider: [facility]   I discussed the limitations of evaluation and management by telemedicine and the availability of in person appointments. The patient expressed understanding and agreed to proceed.  The patient was advised to call back or seek an in-person evaluation if the symptoms worsen or if the condition fails to improve as anticipated.  I provided 45 minutes of non-face-to-face time during this encounter.   Tarrance Januszewski R Dickson Kostelnik, LCSW    THERAPIST PROGRESS NOTE  Session Time: 8-8:45a  Participation Level: Active  Behavioral Response: NAAlertAnxious  Type of Therapy: Individual Therapy  Treatment Goals addressed: Anxiety and Coping  Interventions: Motivational Interviewing and Supportive  Summary: [name] is a 54 y.o. male who presents with improving symptoms consistent with schizoaffective disorder. Pt reports that he is continuing to have fair quality and quantity of sleep at night. After reviewing daily schedule, pt is napping a lot during the day. Discussed shortening length of pts daily naps which triggered anxiety in pt. Will revisit at future sessions.  Pt does not want to change nap schedule--feels that the naps help him with mood management and keeping him "happy" so that he gets along well with others.   Allowed pt space to discuss thoughts and feelings about current life situations/relationships. Pt reports that he enjoys playing cards with his father daily--and enjoys spending time with his caregivers too.  Pt reports that he enjoys reading the  bible daily and spending quality time with family members. Pt reports that relationships are good with father, siblings, caregivers, and medical providers.   Discussed daily schedule and encouraged pt to continue with self care, life management, positive social engagement, and keeping activities in balance throughout the day.    Suicidal/Homicidal: No  SI, HI, or AVH reported at time of session.  Therapist Response: [name] is doing a better job of life management, mood management, and developing plans to manage situations that trigger anxiety. This is indicative of good progress.  Treatment showing good evolution and development.   Plan: Return again in 3 weeks.  Diagnosis: Axis I: Schizoaffective Disorder    Axis II: No diagnosis    [name] Parul Porcelli, LCSW 12/08/2020

## 2020-12-15 ENCOUNTER — Other Ambulatory Visit: Payer: Self-pay | Admitting: Psychiatry

## 2020-12-15 DIAGNOSIS — F259 Schizoaffective disorder, unspecified: Secondary | ICD-10-CM

## 2020-12-31 ENCOUNTER — Ambulatory Visit (INDEPENDENT_AMBULATORY_CARE_PROVIDER_SITE_OTHER): Payer: Medicare Other | Admitting: Licensed Clinical Social Worker

## 2020-12-31 ENCOUNTER — Other Ambulatory Visit: Payer: Self-pay

## 2020-12-31 DIAGNOSIS — F251 Schizoaffective disorder, depressive type: Secondary | ICD-10-CM

## 2020-12-31 NOTE — Progress Notes (Signed)
Virtual Visit via Video Note  I connected with Andres [name] and sister, Andres [name] on 12/31/20 at  8:00 AM EST by a video enabled telemedicine application and verified that I am speaking with the correct person using two identifiers.  Location: Patient: home Provider: remote office [employer], [place])   I discussed the limitations of evaluation and management by telemedicine and the availability of in person appointments. The patient expressed understanding and agreed to proceed.   The patient was advised to call back or seek an in-person evaluation if the symptoms worsen or if the condition fails to improve as anticipated.  I provided 60 minutes of non-face-to-face time during this encounter.   Meilech Virts R Nadim Malia, LCSW    THERAPIST PROGRESS NOTE  Session Time: 8-9a  Participation Level: Active  Behavioral Response: Neat and Well GroomedAlertAnxious  Type of Therapy: Individual Therapy  Treatment Goals addressed: Anxiety and Coping  Interventions: Solution Focused, Supportive and Reframing  Summary: Andres [name] is a 55 y.o. male who presents with symptoms consistent with schizoaffective disorder.  Pt reports that his mood is improved and that his stress/anxiety levels have improved. Pt reports inconsistent quality and quantity of sleep.   Allowed pt to explore and express thoughts and feelings that pt is experiencing in life currently: religious beliefs, relationships with extended family members and friends, pts preferred activities, pts goals for the future, things that are bothering pt, setting personal boundaries, perspective-taking/empathy training, coping with perceptual disturbances.  Explored pts personal religious beliefs and how they impact everyday functioning and interactions with others. Discussed tolerance and coping with trigger feelings that pt is getting when others believe differently than himself.  Discussed holiday gatherings and extended family members  and friends that came to visit pt. Pt feels very supported and loved by extended family and friends.  Allowed pt to explore personal boundaries that pt is setting for himself that make him feel more comfortable when out in public with others. Pt enjoys spending time with others and engaging socially with others, but feels uncomfortable when certain topics are discussed, or if people start "talking bad". Reviewed ways that pt could keep himself feeling comfortable in those situations.   Pt reports that he continues to have occasional hallucinations, triggered by when he sees snakes or a snake-like object. Pt will see snakes at that moment, which triggers anxiety/panic. Reviewed managing panic in the moment and using positive self talk to convince self in moment that the visual hallucinations are not real. Pt seems reassured and reports that he typically can "talk his way through" those episodes because he knows the snakes are not real.   Pt has personal goal to complete physical therapy in 2022 to gain more mobility.  Encouraged pt to continue being assertive with communication skills--pt is being open about expressing his wants and needs. Encouraged continued focus on self care, positive social supports, healthy lifestyle, balance.       Suicidal/Homicidal: No  Therapist Response: Andres [name] is working hard to identify anxiety triggers and identifying coping skills that are helpful for him and utilizing the skills on a regular basis to manage stress/anxiety. This is a reflection of overall progress.   Plan: Return again in 3 weeks.  Diagnosis: Axis I: Schizoaffective Disorder    Axis II: No diagnosis    [name] Chaunte Hornbeck, LCSW 12/31/2020

## 2021-01-07 ENCOUNTER — Ambulatory Visit (INDEPENDENT_AMBULATORY_CARE_PROVIDER_SITE_OTHER): Payer: Medicare Other | Admitting: Nurse Practitioner

## 2021-01-07 ENCOUNTER — Encounter (INDEPENDENT_AMBULATORY_CARE_PROVIDER_SITE_OTHER): Payer: Self-pay | Admitting: Nurse Practitioner

## 2021-01-07 ENCOUNTER — Other Ambulatory Visit: Payer: Self-pay

## 2021-01-07 VITALS — BP 154/99 | HR 66 | Ht 66.0 in | Wt 157.0 lb

## 2021-01-07 DIAGNOSIS — I10 Essential (primary) hypertension: Secondary | ICD-10-CM | POA: Diagnosis not present

## 2021-01-07 DIAGNOSIS — R600 Localized edema: Secondary | ICD-10-CM | POA: Diagnosis not present

## 2021-01-07 NOTE — Progress Notes (Signed)
Subjective:    Patient ID: Andres [name], male    DOB: [birth year], 55 y.o.   MRN: [id number] Chief Complaint  Patient presents with  . New Patient (Initial Visit)    Ble edema  venous insufficiency    Patient is seen for evaluation of leg swelling.  The patient is a poor historian but his sister is present to help with history as well as redirection.  The patient first noticed the swelling remotely but is now concerned because of a significant increase in the overall edema. The swelling is associated with discoloration.  This has been ongoing for approximately 2 years.  The patient has had wounds and ulcerations with weeping and bleeding for the past but currently he does not have any.  The patient's history notes that the patient has lost a great deal of weight by controlling his diet recently and this may have helped with his swelling.  The patient notes that in the morning the legs are significantly improved but they steadily worsened throughout the course of the day. Elevation makes the legs better, dependency makes them worse.   There is  ulcerations and weeping associated with the swelling.   The patient denies any recent changes in their medications.  The patient has not been wearing graduated compression.  The patient has no had any past angiography, interventions or vascular surgery.  The patient denies a history of DVT or PE. There is no prior history of phlebitis. There is no history of primary lymphedema.  There is no history of radiation treatment to the groin or pelvis No history of malignancies. No history of trauma or groin or pelvic surgery. No history of foreign travel or parasitic infections area    Review of Systems  Cardiovascular: Positive for leg swelling.  Psychiatric/Behavioral: Positive for decreased concentration.  All other systems reviewed and are negative.      Objective:   Physical Exam Vitals reviewed.  HENT:     Head: Normocephalic.   Cardiovascular:     Rate and Rhythm: Normal rate.     Pulses: Normal pulses.  Pulmonary:     Effort: Pulmonary effort is normal.  Musculoskeletal:     Right lower leg: Edema present.     Left lower leg: Edema present.  Skin:    General: Skin is warm and dry.  Neurological:     Mental Status: He is alert.     Coordination: Coordination abnormal.     Gait: Gait abnormal.  Psychiatric:        Attention and Perception: He is inattentive.        Speech: Speech is rapid and pressured.        Behavior: Behavior is hyperactive.        Cognition and Memory: Cognition is impaired.        Judgment: Judgment is impulsive.     BP (!) 154/99   Pulse 66   Ht 5\' 6"  (1.676 m)   Wt 157 lb (71.2 kg)   BMI 25.34 kg/m   Past Medical History:  Diagnosis Date  . Allergy     Social History   Socioeconomic History  . Marital status: Single    Spouse name: Not on file  . Number of children: 0  . Years of education:  . Highest education level: High school graduate  Occupational History    Comment: disabled  Tobacco Use  . Smoking status: Never Smoker  . Smokeless tobacco: Never Used  Vaping  Use  . Vaping Use: Never used  Substance and Sexual Activity  . Alcohol use: No  . Drug use: No  . Sexual activity: Not Currently  Other Topics Concern  . Not on file  Social History Narrative   Lives with parents, mental handicap, uses walker   Social Determinants of Health   Financial Resource Strain: Not on file  Food Insecurity: Not on file  Transportation Needs: Not on file  Physical Activity: Not on file  Stress: Not on file  Social Connections: Not on file  Intimate Partner Violence: Not on file    Past Surgical History:  Procedure Laterality Date  . ANKLE SURGERY    . COLON SURGERY    . HERNIA REPAIR    . INTRAMEDULLARY (IM) NAIL INTERTROCHANTERIC Right 06/18/2019   Procedure: INTRAMEDULLARY (IM) NAIL INTERTROCHANTRIC;  Surgeon: Lyndle Herrlich, MD;  Location:  [facility] ORS;  Service: Orthopedics;  Laterality: Right;  . TONSILLECTOMY      Family History  Problem Relation Age of Onset  . Diabetes Mother   . Stroke Mother   . Cancer Father        colon  . Colon cancer Father   . Colon cancer Other   . Mental illness Neg Hx     Allergies  Allergen Reactions  . Prednisone Other (See Comments)    Did not like the way it made him feel   . Lamotrigine Rash    CBC Latest Ref Rng & Units 10/08/2020 09/24/2019 06/21/2019  WBC 3.8 - 10.8 Thousand/uL 4.6 4.6 4.8  Hemoglobin 13.2 - 17.1 g/dL 60.6 30.1 10.6(L)  Hematocrit 38.5 - 50.0 % 47.0 45.8 30.6(L)  Platelets 140 - 400 Thousand/uL 122(L) 137(L) 132(L)      CMP     Component Value Date/Time   NA 145 10/08/2020 0820   K 3.7 10/08/2020 0820   CL 109 10/08/2020 0820   CO2 26 10/08/2020 0820   GLUCOSE 77 10/08/2020 0820   BUN 18 10/08/2020 0820   CREATININE 1.03 10/08/2020 0820   CALCIUM 9.3 10/08/2020 0820   PROT 6.1 10/08/2020 0820   ALBUMIN 3.8 06/17/2019 1338   AST 13 10/08/2020 0820   ALT 13 10/08/2020 0820   ALKPHOS 56 06/17/2019 1338   BILITOT 0.7 10/08/2020 0820   GFRNONAA 81 10/08/2020 0820   GFRAA 94 10/08/2020 0820     No results found.     Assessment & Plan:   1. Localized edema I have had a long discussion with the patient regarding swelling and why it  causes symptoms.  Patient will begin wearing graduated compression stockings class 1 (20-30 mmHg) on a daily basis a prescription was given. The patient will  beginning wearing the stockings first thing in the morning and removing them in the evening. The patient is instructed specifically not to sleep in the stockings.   In addition, behavioral modification will be initiated.  This will include frequent elevation, use of over the counter pain medications and exercise such as walking.  I have reviewed systemic causes for chronic edema such as liver, kidney and cardiac etiologies.  The patient denies problems with these  organ systems.    Consideration for a lymph pump will also be made based upon the effectiveness of conservative therapy.  This would help to improve the edema control and prevent sequela such as ulcers and infections   Patient should undergo duplex ultrasound of the venous system to ensure that DVT or reflux is not present.  The patient will follow-up with me after the ultrasound.    2. Essential hypertension Continue antihypertensive medications as already ordered, these medications have been reviewed and there are no changes at this time.    Current Outpatient Medications on File Prior to Visit  Medication Sig Dispense Refill  . allopurinol (ZYLOPRIM) 100 MG tablet Take 1 tablet (100 mg total) by mouth daily. 90 tablet 3  . cholecalciferol (VITAMIN D) 1000 units tablet Take 1,000 Units by mouth daily.    . citalopram (CELEXA) 20 MG tablet Take 1 tablet (20 mg total) by mouth daily. 90 tablet 1  . fexofenadine (ALLEGRA) 180 MG tablet Take 180 mg by mouth.    . INGREZZA 80 MG CAPS TAKE 1 CAPSULE BY MOUTH AT BEDTIME 30 capsule 7  . propranolol (INDERAL) 10 MG tablet Take 1 tablet (10 mg total) by mouth 2 (two) times daily as needed. For severe anxiety/agitation 180 tablet 2  . QUEtiapine (SEROQUEL) 25 MG tablet Take 1 tablet (25 mg total) by mouth at bedtime. 90 tablet 2  . thiamine (VITAMIN B-1) 100 MG tablet Take 100 mg by mouth daily.    . traZODone (DESYREL) 100 MG tablet Take 1 tablet (100 mg total) by mouth at bedtime. 90 tablet 1  . levothyroxine (SYNTHROID) 25 MCG tablet TAKE 1 TABLET BY MOUTH DAILY BEFORE BREAKFAST ON AN EMPTY STOMACH( NOT TO TAKE WITH OTHER MEDICINES) (Patient not taking: Reported on 01/07/2021) 90 tablet 3   No current facility-administered medications on file prior to visit.    There are no Patient Instructions on file for this visit. No follow-ups on file.   Georgiana Spinner, NP

## 2021-01-08 ENCOUNTER — Telehealth (INDEPENDENT_AMBULATORY_CARE_PROVIDER_SITE_OTHER): Payer: Medicare Other | Admitting: Psychiatry

## 2021-01-08 ENCOUNTER — Encounter: Payer: Self-pay | Admitting: Psychiatry

## 2021-01-08 DIAGNOSIS — F419 Anxiety disorder, unspecified: Secondary | ICD-10-CM | POA: Diagnosis not present

## 2021-01-08 DIAGNOSIS — F70 Mild intellectual disabilities: Secondary | ICD-10-CM

## 2021-01-08 DIAGNOSIS — F251 Schizoaffective disorder, depressive type: Secondary | ICD-10-CM | POA: Diagnosis not present

## 2021-01-08 DIAGNOSIS — F5105 Insomnia due to other mental disorder: Secondary | ICD-10-CM

## 2021-01-08 DIAGNOSIS — G2401 Drug induced subacute dyskinesia: Secondary | ICD-10-CM | POA: Diagnosis not present

## 2021-01-08 MED ORDER — CITALOPRAM HYDROBROMIDE 40 MG PO TABS: 40.0000 mg | ORAL_TABLET | Freq: Every day | ORAL | 0 refills | Status: DC

## 2021-01-08 NOTE — Progress Notes (Signed)
Virtual Visit via Video Note  I connected with Dannis Deroche Bulson on 01/08/21 at 11:30 AM EST by a video enabled telemedicine application and verified that I am speaking with the correct person using two identifiers.  Location Provider Location : [facility] Patient Location : Home  Participants: Patient ,Sister, Provider   I discussed the limitations of evaluation and management by telemedicine and the availability of in person appointments. The patient expressed understanding and agreed to proceed.   I discussed the assessment and treatment plan with the patient. The patient was provided an opportunity to ask questions and all were answered. The patient agreed with the plan and demonstrated an understanding of the instructions.   The patient was advised to call back or seek an in-person evaluation if the symptoms worsen or if the condition fails to improve as anticipated.   BH MD OP Progress Note  01/08/2021 12:48 PM CHANLER MENDONCA  MRN:  [id number]  Chief Complaint:  Chief Complaint    Follow-up     HPI: MALLIE LINNEMANN is a 55 year old Caucasian male who has a history of schizoaffective disorder, GAD, hypothyroidism, mild intellectual disability was evaluated by telemedicine today.  Collateral information was obtained from sister-[name].  Patient today appeared to be anxious, hyperverbal in session.  He seemed to be preoccupied with topics of religious content, was observed as ruminating and repeatedly talking about a lot of things that he worries about including his dad who is elderly.  Patient talked about worrying about, ' what will happen to him once his dad dies'.  Patient also seems to be preoccupied with not having a girlfriend in his life.  Patient had to be redirected during the session several times.  Patient reports he is compliant on medications.  He denies side effects.  He reports sleep is overall okay.  According to sister she has noticed him to be more anxious the past few  weeks.  She believes he is compliant on medications.  He continues to be in psychotherapy sessions and sister agrees to have more frequent sessions and discussion with therapist.  He used to go to day program in the past, [facility].  They were supposed to open up this week however due to COVID 19 cases going up they have rescheduled it to another time.    Visit Diagnosis:    ICD-10-CM   1. Schizoaffective disorder, depressive type (HCC)  F25.1   2. 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 Anxiety disorder, unspecified type  F41.9 citalopram (CELEXA) 40 MG tablet  5. Mild intellectual disability  F70     Past Psychiatric History: I have reviewed past psychiatric history from my progress note on 03/29/2018.  Past Medical History:  Past Medical History:  Diagnosis Date  . Allergy     Past Surgical History:  Procedure Laterality Date  . ANKLE SURGERY    . COLON SURGERY    . HERNIA REPAIR    . INTRAMEDULLARY (IM) NAIL INTERTROCHANTERIC Right 06/18/2019   Procedure: INTRAMEDULLARY (IM) NAIL INTERTROCHANTRIC;  Surgeon: Lyndle Herrlich, MD;  Location: [facility] ORS;  Service: Orthopedics;  Laterality: Right;  . TONSILLECTOMY      Family Psychiatric History: I have reviewed family psychiatric history from my progress note on 03/29/2018  Family History:  Family History  Problem Relation Age of Onset  . Diabetes Mother   . Stroke Mother   . Cancer Father  colon  . Colon cancer Father   . Colon cancer Other   . Mental illness Neg Hx     Social History: Reviewed social history from my progress note on 03/29/2018 Social History   Socioeconomic History  . Marital status: Single    Spouse name: Not on file  . Number of children: 0  . Years of education: [employer]  . Highest education level: High school graduate  Occupational History    Comment: disabled  Tobacco Use  . Smoking status: Never Smoker  . Smokeless tobacco: Never  Used  Vaping Use  . Vaping Use: Never used  Substance and Sexual Activity  . Alcohol use: No  . Drug use: No  . Sexual activity: Not Currently  Other Topics Concern  . Not on file  Social History Narrative   Lives with parents, mental handicap, uses walker   Social Determinants of Health   Financial Resource Strain: Not on file  Food Insecurity: Not on file  Transportation Needs: Not on file  Physical Activity: Not on file  Stress: Not on file  Social Connections: Not on file    Allergies:  Allergies  Allergen Reactions  . Prednisone Other (See Comments)    Did not like the way it made him feel   . Lamotrigine Rash    Metabolic Disorder Labs: Lab Results  Component Value Date   HGBA1C 4.8 09/24/2019   MPG 91 09/24/2019   MPG 91 10/05/2018   No results found for: PROLACTIN Lab Results  Component Value Date   CHOL 143 10/08/2020   TRIG 101 10/08/2020   HDL 46 10/08/2020   CHOLHDL 3.1 10/08/2020   LDLCALC 79 10/08/2020   LDLCALC 89 09/24/2019   Lab Results  Component Value Date   TSH 4.47 10/08/2020   TSH 4.92 (H) 04/02/2020    Therapeutic Level Labs: No results found for: LITHIUM No results found for: VALPROATE No components found for:  CBMZ  Current Medications: Current Outpatient Medications  Medication Sig Dispense Refill  . citalopram (CELEXA) 40 MG tablet Take 1 tablet (40 mg total) by mouth daily. 90 tablet 0  . allopurinol (ZYLOPRIM) 100 MG tablet Take 1 tablet (100 mg total) by mouth daily. 90 tablet 3  . cholecalciferol (VITAMIN D) 1000 units tablet Take 1,000 Units by mouth daily.    . fexofenadine (ALLEGRA) 180 MG tablet Take 180 mg by mouth.    . INGREZZA 80 MG CAPS TAKE 1 CAPSULE BY MOUTH AT BEDTIME 30 capsule 7  . levothyroxine (SYNTHROID) 25 MCG tablet TAKE 1 TABLET BY MOUTH DAILY BEFORE BREAKFAST ON AN EMPTY STOMACH( NOT TO TAKE WITH OTHER MEDICINES) (Patient not taking: Reported on 01/07/2021) 90 tablet 3  . propranolol (INDERAL) 10 MG  tablet Take 1 tablet (10 mg total) by mouth 2 (two) times daily as needed. For severe anxiety/agitation 180 tablet 2  . QUEtiapine (SEROQUEL) 25 MG tablet Take 1 tablet (25 mg total) by mouth at bedtime. 90 tablet 2  . thiamine (VITAMIN B-1) 100 MG tablet Take 100 mg by mouth daily.    . traZODone (DESYREL) 100 MG tablet Take 1 tablet (100 mg total) by mouth at bedtime. 90 tablet 1   No current facility-administered medications for this visit.     Musculoskeletal: Strength & Muscle Tone: UTA Gait & Station: UTA Patient leans: N/A  Psychiatric Specialty Exam: Review of Systems  Psychiatric/Behavioral: The patient is nervous/anxious.   All other systems reviewed and are negative.   There  were no vitals taken for this visit.There is no height or weight on file to calculate BMI.  General Appearance: Casual  Eye Contact:  Fair  Speech:  Pressured  Volume:  Normal  Mood:  Anxious  Affect:  Congruent  Thought Process:  Goal Directed and Descriptions of Associations: Intact  Orientation:  Full (Time, Place, and Person)  Thought Content: Rumination   Suicidal Thoughts:  No  Homicidal Thoughts:  No  Memory:  Immediate;   Fair Recent;   Fair Remote;   Limited  Judgement:  Poor  Insight:  Shallow  Psychomotor Activity:  Increased and Restlessness  Concentration:  Concentration: Fair and Attention Span: Fair  Recall:  [employer]  Fund of Knowledge: Poor  Language: Fair  Akathisia:  No  Handed:  Right  AIMS (if indicated): Hx of TD - stable  Assets:  Housing Social Support  ADL's:  Intact  Cognition: Limited , has ID  Sleep:  Fair   Screenings: AIMS   Flowsheet Row Office Visit from [facility]11/2019 in [facility] Office Visit from [facility]07/2018 in [facility] Office Visit from [facility] Office Visit from 05/31/2018 in [facility] Office Visit from 05/16/2018 in  [facility]  AIMS Total Score 5 8 8 8 9     GAD-7   Flowsheet Row [facility]07[facility]  Total GAD-7 Score 15    PHQ2-9   Flowsheet Row Office Visit from 12[facility]04/2020 in [facility] Office Visit from [facility]/2020 [facility] Office Visit from 10/10/2019 in [facility] Office Visit from [facility]19/2019 in [facility]07[facility]2018 [facility]  PHQ-2 Total Score 0 0 0 0 2  PHQ-9 Total Score -- 0 -- -- 10       Assessment and Plan: Ernestina PennaBarry W Pecha is a 55 year old Caucasian male who has a history of schizoaffective disorder, diabetes, GAD, hypothyroidism was evaluated by telemedicine today.  Patient is currently struggling with anxiety symptoms and will benefit from the following plan.  Plan Schizoaffective disorder- unstable Seroquel 25 mg p.o. nightly Trazodone 100 mg p.o. nightly as needed Increase Celexa to 40 mg p.o. daily Propranolol 10 mg p.o. daily as needed for severe anxiety, agitation We'll consider adding another mood stabilizer if he continues to decompensate.  Anxiety disorder unspecified-rule out GAD-unstable Increase Celexa to 40 mg p.o. daily Continue propranolol as prescribed Advised patient to have more frequent sessions with his therapist. Will recommend patient participate in a day program or a group.  [facility] [place]. He could also have a discussion with his therapist. Patient used to be in a day program previously however is unable to attend due to COVID-19.  TD-stable Ingrezza 80 mg p.o. daily  We will coordinate care with his therapist, Ms.Hussami  Provided supportive psychotherapy.  Collateral information obtained from sister-[name] as summarized above.  Follow-up in clinic in 3 weeks or sooner if needed.  I have spent atleast 30 minutes face to face by video  with patient today. More than 50 % of the time was spent for preparing to see the patient ( e.g., review of test, records ), obtaining and to review and separately obtained history , ordering medications and test ,psychoeducation and supportive psychotherapy and care coordination,as well as documenting clinical information in electronic health record. This note was generated in part or whole  with voice recognition software. Voice recognition is usually quite accurate but there are transcription errors that can and very often do occur. I apologize for any typographical errors that were not detected and corrected.       Jomarie Longs, MD 01/09/2021, 8:08 AM

## 2021-01-26 ENCOUNTER — Other Ambulatory Visit (INDEPENDENT_AMBULATORY_CARE_PROVIDER_SITE_OTHER): Payer: Self-pay | Admitting: Nurse Practitioner

## 2021-01-26 DIAGNOSIS — R600 Localized edema: Secondary | ICD-10-CM

## 2021-01-28 ENCOUNTER — Other Ambulatory Visit: Payer: Self-pay

## 2021-01-28 ENCOUNTER — Ambulatory Visit (INDEPENDENT_AMBULATORY_CARE_PROVIDER_SITE_OTHER): Payer: Medicare Other

## 2021-01-28 ENCOUNTER — Encounter (INDEPENDENT_AMBULATORY_CARE_PROVIDER_SITE_OTHER): Payer: Self-pay | Admitting: Nurse Practitioner

## 2021-01-28 ENCOUNTER — Ambulatory Visit (INDEPENDENT_AMBULATORY_CARE_PROVIDER_SITE_OTHER): Payer: Medicare Other | Admitting: Nurse Practitioner

## 2021-01-28 VITALS — BP 121/80 | HR 67 | Resp 16 | Wt 153.0 lb

## 2021-01-28 DIAGNOSIS — I872 Venous insufficiency (chronic) (peripheral): Secondary | ICD-10-CM

## 2021-01-28 DIAGNOSIS — I890 Lymphedema, not elsewhere classified: Secondary | ICD-10-CM | POA: Diagnosis not present

## 2021-01-28 DIAGNOSIS — I10 Essential (primary) hypertension: Secondary | ICD-10-CM

## 2021-01-28 DIAGNOSIS — R600 Localized edema: Secondary | ICD-10-CM

## 2021-01-29 ENCOUNTER — Ambulatory Visit (INDEPENDENT_AMBULATORY_CARE_PROVIDER_SITE_OTHER): Payer: Medicare Other | Admitting: Licensed Clinical Social Worker

## 2021-01-29 DIAGNOSIS — F251 Schizoaffective disorder, depressive type: Secondary | ICD-10-CM

## 2021-01-29 NOTE — Progress Notes (Signed)
Virtual Visit via Video Note  I connected with Andres [name] and sister, Andres [name]  on 01/29/21 at  9:00 AM EST by a video enabled telemedicine application and verified that I am speaking with the correct person using two identifiers.  Location: Patient: home Provider: [facility]   I discussed the limitations of evaluation and management by telemedicine and the availability of in person appointments. The patient expressed understanding and agreed to proceed.  I discussed the assessment and treatment plan with the patient. The patient was provided an opportunity to ask questions and all were answered. The patient agreed with the plan and demonstrated an understanding of the instructions.   The patient was advised to call back or seek an in-person evaluation if the symptoms worsen or if the condition fails to improve as anticipated.  I provided 30 minutes of non-face-to-face time during this encounter.   Andres Lagrand R Staley Budzinski, LCSW    THERAPIST PROGRESS NOTE  Session Time: 9-9:30a  Participation Level: Minimal--pt very tired, groggy, sleepy.  Behavioral Response: Neat and Well GroomedDrowsy and LethargicIrritable  Type of Therapy: Individual Therapy  Treatment Goals addressed: Describe current and past experiences with specific fears, prominent worries, and anxiety symptoms including their impact on functioning and attempts to resolve it  Interventions: Supportive  Summary: Andres [name] is a 55 y.o. male who presents with continuing symptoms consistent with schizoaffective disorder, depressive type. Andres [name] presents this morning for his session very sleepy, irritable, and unwilling to engage in session. Pt seemed disoriented and out of sorts.  This is out of the norm for Khari, since he is typically very excited and engaged throughout his sessions.   Pt and sister both report that Andres [name]'s father is in the hospital due to dehydration. Pts sister reports that Andres [name] is feeling relieved that  he doesn't have the extra responsibility of taking care of his father now that his father is in the hospital.   Pt and sister both report that caregivers are continuing to come to the home on a regular basis. Pt reporting that he is compliant with medication and that his appetite is consistent.  Pt and sister reporting good quality and quantity of sleep "I go to bed around nine every night". Pts sister reports that she let him sleep late today.    Andres [name] stated that his "schedule is all out of whack" since his father went into the hospital--reinforced the need to keep a solid structure to help Andres [name] adjust to this new situation. Currently unclear when Andres [name]'s dad will be allowed back home.  Pt denies being in any chronic pain at time of session.   Ended session early due to pts limited engagement and irritability/distraction (caregiver entered the room and kept talking to Winfred and his sister during the session).   Encouraged pt and sister to continue focusing on daily structure, sleep quality, self care, being active as possible, and medication compliance.  Suicidal/Homicidal: No  Therapist Response: Andres [name] is continuing to focus on his social involvement, eating patterns, and finding joy in everyday activities and is able to describe current and past experiences with prominent worries and anxiety triggers--and express how he is coping with them. Andres [name] has not achieved long term goals but is progressing towards achievement. Treatment to continue as indicated  Plan: Return again in 4 weeks.  Diagnosis: Axis I: Schizoaffective Disorder    Axis II: No diagnosis    [name] Jeffie Widdowson, LCSW 01/29/2021

## 2021-02-02 ENCOUNTER — Other Ambulatory Visit: Payer: Self-pay

## 2021-02-02 ENCOUNTER — Telehealth (INDEPENDENT_AMBULATORY_CARE_PROVIDER_SITE_OTHER): Payer: Medicare Other | Admitting: Psychiatry

## 2021-02-02 ENCOUNTER — Encounter: Payer: Self-pay | Admitting: Psychiatry

## 2021-02-02 DIAGNOSIS — F251 Schizoaffective disorder, depressive type: Secondary | ICD-10-CM | POA: Diagnosis not present

## 2021-02-02 DIAGNOSIS — F70 Mild intellectual disabilities: Secondary | ICD-10-CM

## 2021-02-02 DIAGNOSIS — G2401 Drug induced subacute dyskinesia: Secondary | ICD-10-CM

## 2021-02-02 DIAGNOSIS — F5105 Insomnia due to other mental disorder: Secondary | ICD-10-CM

## 2021-02-02 NOTE — Progress Notes (Signed)
Virtual Visit via Video Note  I connected with [name] on 02/02/21 at 10:00 AM EST by a video enabled telemedicine application and verified that I am speaking with the correct person using two identifiers.  Location Provider Location : [facility] Patient Location : Home  Participants: Patient , Sister,Provider    I discussed the limitations of evaluation and management by telemedicine and the availability of in person appointments. The patient expressed understanding and agreed to proceed  I discussed the assessment and treatment plan with the patient. The patient was provided an opportunity to ask questions and all were answered. The patient agreed with the plan and demonstrated an understanding of the instructions.  The patient was advised to call back or seek an in-person evaluation if the symptoms worsen or if the condition fails to improve as anticipated.   BH MD OP Progress Note  02/02/2021 12:56 PM Andres [name]  MRN:  [id number]  Chief Complaint:  Chief Complaint    Follow-up     HPI: Andres [name] is a 55 year old Caucasian male, has a history of schizoaffective disorder, GAD, hypothyroidism, mild intellectual disability was evaluated by telemedicine today.  Collateral information was obtained from sister- Andres [name].  Patient today appeared to be calm and cooperative.  He was able to answer questions appropriately.  Patient denied any significant anxiety symptoms.  He did not appear to be agitated or restless today.  Patient reports sleep has improved.  He is sleeping through the night.  He is compliant on the Celexa, higher dosage.  Tolerating it well.  Denies side effects.  Patient does report that his dad is in the hospital however he is coping with it better than before.  He has a lot of good social support system.  He does worry about his dad coming back home too soon and worries that he will not be able to take care of him by himself.  Patient looks forward to starting back  going to the day program at [facility]' soon.  He was supposed to start today however due to the weather that got canceled.  Patient denies any suicidality, homicidality or perceptual disturbances.  Patient denies any other concerns today.  Per sister who was present in evaluation today patient is currently less anxious and compliant on medications.  She and her other siblings are trying to support patient to cope with his dad's hospitalization.  However his dad is getting better and they are planning to send him to a rehabilitation place.  According to sister patient is making progress.  He has been in psychotherapy sessions and reports therapy sessions as beneficial.  Denies any concerns or side effects to medications at this time.   Visit Diagnosis:    ICD-10-CM   1. Schizoaffective disorder, depressive type (HCC)  F25.1   2. 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 Mild intellectual disability  F70     Past Psychiatric History: I have reviewed past psychiatric history from my progress note on 03/29/2018  Past Medical History:  Past Medical History:  Diagnosis Date  . Allergy     Past Surgical History:  Procedure Laterality Date  . ANKLE SURGERY    . COLON SURGERY    . HERNIA REPAIR    . INTRAMEDULLARY (IM) NAIL INTERTROCHANTERIC Right 06/18/2019   Procedure: INTRAMEDULLARY (IM) NAIL INTERTROCHANTRIC;  Surgeon: Lyndle Herrlich, MD;  Location: [facility] ORS;  Service: Orthopedics;  Laterality: Right;  .  TONSILLECTOMY      Family Psychiatric History: I have reviewed family psychiatric history from my progress note on 03/29/2018  Family History:  Family History  Problem Relation Age of Onset  . Diabetes Mother   . Stroke Mother   . Cancer Father        colon  . Colon cancer Father   . Colon cancer Other   . Mental illness Neg Hx     Social History: Reviewed social history from my progress note on 03/29/2018 Social History   Socioeconomic  History  . Marital status: Single    Spouse name: Not on file  . Number of children: 0  . Years of education: [employer]  . Highest education level: High school graduate  Occupational History    Comment: disabled  Tobacco Use  . Smoking status: Never Smoker  . Smokeless tobacco: Never Used  Vaping Use  . Vaping Use: Never used  Substance and Sexual Activity  . Alcohol use: No  . Drug use: No  . Sexual activity: Not Currently  Other Topics Concern  . Not on file  Social History Narrative   Lives with parents, mental handicap, uses walker   Social Determinants of Health   Financial Resource Strain: Not on file  Food Insecurity: Not on file  Transportation Needs: Not on file  Physical Activity: Not on file  Stress: Not on file  Social Connections: Not on file    Allergies:  Allergies  Allergen Reactions  . Prednisone Other (See Comments)    Did not like the way it made him feel   . Lamotrigine Rash    Metabolic Disorder Labs: Lab Results  Component Value Date   HGBA1C 4.8 09/24/2019   MPG 91 09/24/2019   MPG 91 10/05/2018   No results found for: PROLACTIN Lab Results  Component Value Date   CHOL 143 10/08/2020   TRIG 101 10/08/2020   HDL 46 10/08/2020   CHOLHDL 3.1 10/08/2020   LDLCALC 79 10/08/2020   LDLCALC 89 09/24/2019   Lab Results  Component Value Date   TSH 4.47 10/08/2020   TSH 4.92 (H) 04/02/2020    Therapeutic Level Labs: No results found for: LITHIUM No results found for: VALPROATE No components found for:  CBMZ  Current Medications: Current Outpatient Medications  Medication Sig Dispense Refill  . allopurinol (ZYLOPRIM) 100 MG tablet Take 1 tablet (100 mg total) by mouth daily. 90 tablet 3  . cholecalciferol (VITAMIN D) 1000 units tablet Take 1,000 Units by mouth daily.    . citalopram (CELEXA) 40 MG tablet Take 1 tablet (40 mg total) by mouth daily. 90 tablet 0  . fexofenadine (ALLEGRA) 180 MG tablet Take 180 mg by mouth.    .  INGREZZA 80 MG CAPS TAKE 1 CAPSULE BY MOUTH AT BEDTIME 30 capsule 7  . levothyroxine (SYNTHROID) 25 MCG tablet TAKE 1 TABLET BY MOUTH DAILY BEFORE BREAKFAST ON AN EMPTY STOMACH( NOT TO TAKE WITH OTHER MEDICINES) (Patient not taking: No sig reported) 90 tablet 3  . propranolol (INDERAL) 10 MG tablet Take 1 tablet (10 mg total) by mouth 2 (two) times daily as needed. For severe anxiety/agitation 180 tablet 2  . QUEtiapine (SEROQUEL) 25 MG tablet Take 1 tablet (25 mg total) by mouth at bedtime. 90 tablet 2  . thiamine (VITAMIN B-1) 100 MG tablet Take 100 mg by mouth daily.    . traZODone (DESYREL) 100 MG tablet Take 1 tablet (100 mg total) by mouth at bedtime.  90 tablet 1   No current facility-administered medications for this visit.     Musculoskeletal: Strength & Muscle Tone: UTA Gait & Station: UTA Patient leans: N/A  Psychiatric Specialty Exam: Review of Systems  Psychiatric/Behavioral: The patient is nervous/anxious.   All other systems reviewed and are negative.   There were no vitals taken for this visit.There is no height or weight on file to calculate BMI.  General Appearance: Casual  Eye Contact:  Fair  Speech:  Clear and Coherent  Volume:  Normal  Mood:  Anxious  Affect:  Congruent  Thought Process:  Goal Directed and Descriptions of Associations: Intact  Orientation:  Full (Time, Place, and Person)  Thought Content: Logical   Suicidal Thoughts:  No  Homicidal Thoughts:  No  Memory:  Immediate;   Fair Recent;   Fair Remote;   Limited  Judgement:  Fair  Insight:  Shallow  Psychomotor Activity:  Normal  Concentration:  Concentration: Fair and Attention Span: Fair  Recall:  [employer] of Knowledge: Limited  Language: Fair  Akathisia:  No  Handed:  Right  AIMS (if indicated): UTA  Assets:  Engineer, maintenance Social Support  ADL's:  Intact  Cognition: Limited - has ID  Sleep:  Improving   Screenings: AIMS   Flowsheet Row Office Visit from 01/11/2019  in [facility] Office Visit from 11/07/2018 in [facility] Office Visit from 09/07/2018 in [facility] Office Visit from 05/31/2018 in [facility] Office Visit from 05/16/2018 in [facility]  AIMS Total Score 5 8 8 8 9     GAD-7   Flowsheet Row Office Visit from 07/07/2018 in [facility]  Total GAD-7 Score 15    PHQ2-9   Flowsheet Row Office Visit from 12/04/2020 in [facility] Office Visit from 04/09/2020 in [facility] Office Visit from 10/10/2019 in [facility] Office Visit from 02/19/2019 in [facility] Office Visit from 07/07/2018 in [place]  PHQ-2 Total Score 0 0 0 0 2  PHQ-9 Total Score - 0 - - 10       Assessment and Plan: MUHAMED LUECKE is a 55 year old Caucasian male who has a history of schizoaffective disorder, diabetes, GAD, hypothyroidism was evaluated by telemedicine today.  Patient is currently making progress with regards to his mood symptoms and sleep problems.  Plan as noted below.  Plan Schizoaffective disorder-improving Seroquel 25 mg p.o. nightly Trazodone 100 mg p.o. nightly as needed Celexa 40 mg p.o. daily Propranolol 10 mg p.o. daily as needed for severe anxiety, agitation.  TD-stable Ingrezza 80 mg p.o. daily  Insomnia due to mental disorder-stable Continue trazodone and Seroquel as prescribed Continue sleep hygiene techniques.  Patient to continue CBT with his therapist Ms.Hussami  Collateral information was obtained from sister-[name] as summarized above.  Patient to continue day program at [facility].  Follow-up in clinic in 1 month or sooner if needed.  I have spent atleast 20 minutes face to face by video with patient today. More than 50 % of the time was spent for preparing to see the patient ( e.g., review of  test, records ), obtaining and to review and separately obtained history , ordering medications and test ,psychoeducation and supportive psychotherapy and care coordination,as well as documenting clinical information in electronic health record. This note was generated in part or whole with voice recognition software. Voice recognition is  usually quite accurate but there are transcription errors that can and very often do occur. I apologize for any typographical errors that were not detected and corrected.           Jomarie Longs, MD 02/02/2021, 12:56 PM

## 2021-02-02 NOTE — Progress Notes (Signed)
Subjective:    Patient ID: Andres [name], male    DOB: 05/17/1966, 55 y.o.   MRN: [id number] Chief Complaint  Patient presents with  . Follow-up    Ultrasound follow up    The patient returns today for follow-up with noninvasive studies regarding her lower extremity edema.  The patient is a somewhat poor historian however his sister is able to help with history as well as redirection.  The leg swelling has been ongoing for approximately 2 years.  He has previously had wounds and ulcerations on his lower extremities.  Currently there are no open wounds ulcerations or weeping present.  The patient was previously worn compression socks however they have not been as effective lately and they've been difficult to place with his worsening edema.  Despite losing a large amount of weight this is not helped his overall edema.  It is noted that elevation does help with edema as well.   No evidence of DVT or superficial venous thrombosis seen bilaterally.  No evidence of superficial venous reflux seen bilaterally.  No deep venous insufficiency seen in the right lower extremity.  There is venous reflux noted in the left femoral vein.      Review of Systems  Cardiovascular: Positive for leg swelling.  All other systems reviewed and are negative.      Objective:   Physical Exam Vitals reviewed.  HENT:     Head: Normocephalic.  Cardiovascular:     Rate and Rhythm: Normal rate.  Pulmonary:     Effort: Pulmonary effort is normal.  Musculoskeletal:     Right lower leg: Edema present.     Left lower leg: Edema present.  Skin:    General: Skin is warm and dry.     Comments: Venous stasis dermatitis bilaterally   Neurological:     Mental Status: He is alert and oriented to person, place, and time.  Psychiatric:        Attention and Perception: Attention normal.        Mood and Affect: Mood normal.        Speech: Speech is rapid and pressured.        Behavior: Behavior is withdrawn. Behavior  is cooperative.        Thought Content: Thought content normal.        Cognition and Memory: Cognition is impaired. Memory is impaired.        Judgment: Judgment is impulsive.     BP 121/80 (BP Location: Right Arm)   Pulse 67   Resp 16   Wt 153 lb (69.4 kg)   BMI 24.69 kg/m   Past Medical History:  Diagnosis Date  . Allergy     Social History   Socioeconomic History  . Marital status: Single    Spouse name: Not on file  . Number of children: 0  . Years of education: [employer]  . Highest education level: High school graduate  Occupational History    Comment: disabled  Tobacco Use  . Smoking status: Never Smoker  . Smokeless tobacco: Never Used  Vaping Use  . Vaping Use: Never used  Substance and Sexual Activity  . Alcohol use: No  . Drug use: No  . Sexual activity: Not Currently  Other Topics Concern  . Not on file  Social History Narrative   Lives with parents, mental handicap, uses walker   Social Determinants of Health   Financial Resource Strain: Not on file  Food Insecurity: Not on  file  Transportation Needs: Not on file  Physical Activity: Not on file  Stress: Not on file  Social Connections: Not on file  Intimate Partner Violence: Not on file    Past Surgical History:  Procedure Laterality Date  . ANKLE SURGERY    . COLON SURGERY    . HERNIA REPAIR    . INTRAMEDULLARY (IM) NAIL INTERTROCHANTERIC Right 06/18/2019   Procedure: INTRAMEDULLARY (IM) NAIL INTERTROCHANTRIC;  Surgeon: Lyndle Herrlich, MD;  Location: [facility] ORS;  Service: Orthopedics;  Laterality: Right;  . TONSILLECTOMY      Family History  Problem Relation Age of Onset  . Diabetes Mother   . Stroke Mother   . Cancer Father        colon  . Colon cancer Father   . Colon cancer Other   . Mental illness Neg Hx     Allergies  Allergen Reactions  . Prednisone Other (See Comments)    Did not like the way it made him feel   . Lamotrigine Rash    CBC Latest Ref Rng & Units  10/08/2020 09/24/2019 06/21/2019  WBC 3.8 - 10.8 Thousand/uL 4.6 4.6 4.8  Hemoglobin 13.2 - 17.1 g/dL 40.3 47.4 10.6(L)  Hematocrit 38.5 - 50.0 % 47.0 45.8 30.6(L)  Platelets 140 - 400 Thousand/uL 122(L) 137(L) 132(L)      CMP     Component Value Date/Time   NA 145 10/08/2020 0820   K 3.7 10/08/2020 0820   CL 109 10/08/2020 0820   CO2 26 10/08/2020 0820   GLUCOSE 77 10/08/2020 0820   BUN 18 10/08/2020 0820   CREATININE 1.03 10/08/2020 0820   CALCIUM 9.3 10/08/2020 0820   PROT 6.1 10/08/2020 0820   ALBUMIN 3.8 06/17/2019 1338   AST 13 10/08/2020 0820   ALT 13 10/08/2020 0820   ALKPHOS 56 06/17/2019 1338   BILITOT 0.7 10/08/2020 0820   GFRNONAA 81 10/08/2020 0820   GFRAA 94 10/08/2020 0820     No results found.     Assessment & Plan:   1. Lymphedema Recommend:  No surgery or intervention at this point in time.    I have reviewed my previous discussion with the patient regarding swelling and why it causes symptoms.  Patient will continue wearing graduated compression stockings class 1 (20-30 mmHg) on a daily basis. The patient will  beginning wearing the stockings first thing in the morning and removing them in the evening. The patient is instructed specifically not to sleep in the stockings.    In addition, behavioral modification including several periods of elevation of the lower extremities during the day will be continued.  This was reviewed with the patient during the initial visit.  The patient will also continue routine exercise, especially walking on a daily basis as was discussed during the initial visit.    Despite conservative treatments of at least 4 weeks including graduated compression therapy class 1 and behavioral modification including exercise and elevation the patient  has not obtained adequate control of the lymphedema.  The patient still has stage 3 lymphedema and therefore, I believe that a lymph pump should be added to improve the control of the  patient's lymphedema.  Additionally, a lymph pump is warranted because it will reduce the risk of cellulitis and ulceration in the future.  Patient should follow-up in six months    2. Essential hypertension Continue antihypertensive medications as already ordered, these medications have been reviewed and there are no changes at this time.  3. Chronic venous insufficiency Patient will continue to follow conservative therapy as outlined above.   Current Outpatient Medications on File Prior to Visit  Medication Sig Dispense Refill  . allopurinol (ZYLOPRIM) 100 MG tablet Take 1 tablet (100 mg total) by mouth daily. 90 tablet 3  . cholecalciferol (VITAMIN D) 1000 units tablet Take 1,000 Units by mouth daily.    . citalopram (CELEXA) 40 MG tablet Take 1 tablet (40 mg total) by mouth daily. 90 tablet 0  . fexofenadine (ALLEGRA) 180 MG tablet Take 180 mg by mouth.    . INGREZZA 80 MG CAPS TAKE 1 CAPSULE BY MOUTH AT BEDTIME 30 capsule 7  . propranolol (INDERAL) 10 MG tablet Take 1 tablet (10 mg total) by mouth 2 (two) times daily as needed. For severe anxiety/agitation 180 tablet 2  . QUEtiapine (SEROQUEL) 25 MG tablet Take 1 tablet (25 mg total) by mouth at bedtime. 90 tablet 2  . thiamine (VITAMIN B-1) 100 MG tablet Take 100 mg by mouth daily.    . traZODone (DESYREL) 100 MG tablet Take 1 tablet (100 mg total) by mouth at bedtime. 90 tablet 1  . levothyroxine (SYNTHROID) 25 MCG tablet TAKE 1 TABLET BY MOUTH DAILY BEFORE BREAKFAST ON AN EMPTY STOMACH( NOT TO TAKE WITH OTHER MEDICINES) (Patient not taking: No sig reported) 90 tablet 3   No current facility-administered medications on file prior to visit.    There are no Patient Instructions on file for this visit. No follow-ups on file.   Georgiana Spinner, NP

## 2021-02-25 ENCOUNTER — Ambulatory Visit: Payer: Medicare Other | Admitting: Licensed Clinical Social Worker

## 2021-03-03 ENCOUNTER — Other Ambulatory Visit: Payer: Self-pay

## 2021-03-03 ENCOUNTER — Ambulatory Visit (INDEPENDENT_AMBULATORY_CARE_PROVIDER_SITE_OTHER): Payer: Medicare Other | Admitting: Licensed Clinical Social Worker

## 2021-03-03 DIAGNOSIS — F251 Schizoaffective disorder, depressive type: Secondary | ICD-10-CM

## 2021-03-03 NOTE — Progress Notes (Signed)
Virtual Visit via Video Note  I connected with Andres [name] on 03/03/21 at  8:00 AM EST by a video enabled telemedicine application and verified that I am speaking with the correct person using two identifiers.  Location: Patient: home Provider: [facility]   I discussed the limitations of evaluation and management by telemedicine and the availability of in person appointments. The patient expressed understanding and agreed to proceed.   I discussed the assessment and treatment plan with the patient. The patient was provided an opportunity to ask questions and all were answered. The patient agreed with the plan and demonstrated an understanding of the instructions.   The patient was advised to call back or seek an in-person evaluation if the symptoms worsen or if the condition fails to improve as anticipated.  I provided 30 minutes of non-face-to-face time during this encounter.   Andres Barret R [name], LCSW    THERAPIST PROGRESS NOTE  Session Time: 8-8:30a  Participation Level: Active  Behavioral Response: NeatAlertIrritable  Type of Therapy: Individual Therapy  Treatment Goals addressed: Anxiety, Communication: social skills and Coping  Interventions: Supportive and Social Skills Training  Summary: Andres [name] is a 55 y.o. male who presents with symptoms consistent with schizoaffective disorder, depressive type. At today's session, pt seemed very irritable and vocal about his irritability.  Discussed with sister timing of appointment--this appt was originally supposed to be in the afternoon but pts sister had to reschedule and took the first available reschedule time, which was an 8am appt. Discussed importance of having session when pt is at a peak in mood and not hungry to be a more productive and interactive session.  Pts father is currently in rehabilitation facility after being hospitalized for covid. Pt has caregivers staying with him all day long and is going to  [facility]  MWF. Pt states that he gets to interact socially with others at [facility] and that he eats good meals there.   Pt is not suicidal but talks about being happy when he goes to heaven. "My dad gets upset when I talk about it and I don't know why. I will only be happy when I get up in heaven".   Continued recommendations are as follows: self care behaviors, positive social engagements, focusing on overall work/home/life balance, and focusing on positive physical and emotional wellness.   Suicidal/Homicidal: No  Therapist Response: Friend is trying hard to be an active participant in each counseling session. Andres [name] is working to increase social interaction with church members and family members. [name] continues to be compliant with medication. Pts behaviors indicate personal growth and progress.  Treatment to continue.     Plan: Return again in 3 weeks.  Diagnosis: Axis I: Schizoaffective Disorder    Axis II: No diagnosis    Andres [name] Andres Fern, LCSW 03/03/2021

## 2021-03-07 ENCOUNTER — Other Ambulatory Visit: Payer: Self-pay | Admitting: Family Medicine

## 2021-03-07 DIAGNOSIS — E039 Hypothyroidism, unspecified: Secondary | ICD-10-CM

## 2021-03-12 ENCOUNTER — Other Ambulatory Visit: Payer: Self-pay

## 2021-03-12 ENCOUNTER — Encounter: Payer: Self-pay | Admitting: Psychiatry

## 2021-03-12 ENCOUNTER — Telehealth (INDEPENDENT_AMBULATORY_CARE_PROVIDER_SITE_OTHER): Payer: Medicare Other | Admitting: Psychiatry

## 2021-03-12 DIAGNOSIS — F251 Schizoaffective disorder, depressive type: Secondary | ICD-10-CM | POA: Diagnosis not present

## 2021-03-12 DIAGNOSIS — G2401 Drug induced subacute dyskinesia: Secondary | ICD-10-CM | POA: Diagnosis not present

## 2021-03-12 DIAGNOSIS — F5105 Insomnia due to other mental disorder: Secondary | ICD-10-CM

## 2021-03-12 DIAGNOSIS — F70 Mild intellectual disabilities: Secondary | ICD-10-CM

## 2021-03-12 DIAGNOSIS — F418 Other specified anxiety disorders: Secondary | ICD-10-CM

## 2021-03-12 MED ORDER — TRAZODONE HCL 100 MG PO TABS: 100.0000 mg | ORAL_TABLET | Freq: Every day | ORAL | 2 refills | Status: DC

## 2021-03-12 MED ORDER — CITALOPRAM HYDROBROMIDE 40 MG PO TABS: 40.0000 mg | ORAL_TABLET | Freq: Every day | ORAL | 2 refills | Status: DC

## 2021-03-12 NOTE — Progress Notes (Signed)
Virtual Visit via Video Note  I connected with [name] on 03/12/21 at 10:20 AM EDT by a video enabled telemedicine application and verified that I am speaking with the correct person using two identifiers.  Location Provider Location : [facility] Patient Location : [facility]  Participants: Patient ,Sister, Provider   I discussed the limitations of evaluation and management by telemedicine and the availability of in person appointments. The patient expressed understanding and agreed to proceed.   I discussed the assessment and treatment plan with the patient. The patient was provided an opportunity to ask questions and all were answered. The patient agreed with the plan and demonstrated an understanding of the instructions.   The patient was advised to call back or seek an in-person evaluation if the symptoms worsen or if the condition fails to improve as anticipated.   BH MD OP Progress Note  03/12/2021 10:51 AM TRUSTIN CHAPA  MRN:  [id number]  Chief Complaint:  Chief Complaint    Follow-up; Anxiety     HPI: [name] is a 55 year old Caucasian male who has a history of schizoaffective disorder, GAD, hypothyroidism, mild intellectual disability was evaluated by telemedicine today.  Collateral information was obtained from sister-[name]  Patient today appeared to be pleasant in session.  He was able to answer questions appropriately.  Patient reports he is coping with his anxiety symptoms better than before.  He denies any sadness.  Reports sleep has improved.  He does not set his alarm up anymore and is able to sleep better.  He wakes up feeling rested.  Patient is compliant on all her medications.  Denies side effects.  He started going to day program- [facility], currently goes 2 days a week.  He is enjoying the day program.  Patient also reports he is working with his therapist.  He is playing cards as recommended by his therapist and that does help him to relax and also  helps with his sleep at night.  Patient reports his dad is currently in a rehab facility and he is planning to visit his dad on Monday which is dad's birthday.  He has already made plans to take dad birthday gifts.  He looks forward to that.  Patient denies any suicidality, homicidality or perceptual disturbances.  Patient denies any other concerns today.  Collateral information obtained from sister who reports patient is currently making progress with regards to his mood.  This is the happiest she has seen him in a very long time.  He is motivated to do things.  He is sleeping better.  He is also coping with his dad's health problems.  Visit Diagnosis:    ICD-10-CM   1. Schizoaffective disorder, depressive type (HCC)  F25.1 traZODone (DESYREL) 100 MG tablet  2. 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 Other specified anxiety disorders  F41.8 citalopram (CELEXA) 40 MG tablet  5. Mild intellectual disability  F70     Past Psychiatric History: I have reviewed past psychiatric history from my progress note on 03/29/2018  Past Medical History:  Past Medical History:  Diagnosis Date  . Allergy     Past Surgical History:  Procedure Laterality Date  . ANKLE SURGERY    . COLON SURGERY    . HERNIA REPAIR    . INTRAMEDULLARY (IM) NAIL INTERTROCHANTERIC Right 06/18/2019   Procedure: INTRAMEDULLARY (IM) NAIL INTERTROCHANTRIC;  Surgeon: Lyndle Herrlich, MD;  Location: [facility] ORS;  Service: Orthopedics;  Laterality: Right;  . TONSILLECTOMY      Family Psychiatric History: I have reviewed family psychiatric history from my progress note on 03/29/2018  Family History:  Family History  Problem Relation Age of Onset  . Diabetes Mother   . Stroke Mother   . Cancer Father        colon  . Colon cancer Father   . Colon cancer Other   . Mental illness Neg Hx     Social History: I have reviewed social history from my progress note on 03/29/2018 Social History    Socioeconomic History  . Marital status: Single    Spouse name: Not on file  . Number of children: 0  . Years of education: [employer]  . Highest education level: High school graduate  Occupational History    Comment: disabled  Tobacco Use  . Smoking status: Never Smoker  . Smokeless tobacco: Never Used  Vaping Use  . Vaping Use: Never used  Substance and Sexual Activity  . Alcohol use: No  . Drug use: No  . Sexual activity: Not Currently  Other Topics Concern  . Not on file  Social History Narrative   Lives with parents, mental handicap, uses walker   Social Determinants of Health   Financial Resource Strain: Not on file  Food Insecurity: Not on file  Transportation Needs: Not on file  Physical Activity: Not on file  Stress: Not on file  Social Connections: Not on file    Allergies:  Allergies  Allergen Reactions  . Prednisone Other (See Comments)    Did not like the way it made him feel   . Lamotrigine Rash    Metabolic Disorder Labs: Lab Results  Component Value Date   HGBA1C 4.8 09/24/2019   MPG 91 09/24/2019   MPG 91 10/05/2018   No results found for: PROLACTIN Lab Results  Component Value Date   CHOL 143 10/08/2020   TRIG 101 10/08/2020   HDL 46 10/08/2020   CHOLHDL 3.1 10/08/2020   LDLCALC 79 10/08/2020   LDLCALC 89 09/24/2019   Lab Results  Component Value Date   TSH 4.47 10/08/2020   TSH 4.92 (H) 04/02/2020    Therapeutic Level Labs: No results found for: LITHIUM No results found for: VALPROATE No components found for:  CBMZ  Current Medications: Current Outpatient Medications  Medication Sig Dispense Refill  . levothyroxine (SYNTHROID) 25 MCG tablet TAKE 1 TABLET BY MOUTH DAILY BEFORE BREAKFAST ON AN EMPTY STOMACH( NOT TO TAKE WITH OTHER MEDICINES) 90 tablet 3  . allopurinol (ZYLOPRIM) 100 MG tablet Take 1 tablet (100 mg total) by mouth daily. 90 tablet 3  . cholecalciferol (VITAMIN D) 1000 units tablet Take 1,000 Units by mouth  daily.    . citalopram (CELEXA) 40 MG tablet Take 1 tablet (40 mg total) by mouth daily. 90 tablet 2  . fexofenadine (ALLEGRA) 180 MG tablet Take 180 mg by mouth.    . INGREZZA 80 MG CAPS TAKE 1 CAPSULE BY MOUTH AT BEDTIME 30 capsule 7  . propranolol (INDERAL) 10 MG tablet Take 1 tablet (10 mg total) by mouth 2 (two) times daily as needed. For severe anxiety/agitation 180 tablet 2  . QUEtiapine (SEROQUEL) 25 MG tablet Take 1 tablet (25 mg total) by mouth at bedtime. 90 tablet 2  . thiamine (VITAMIN B-1) 100 MG tablet Take 100 mg by mouth daily.    . traZODone (DESYREL) 100 MG tablet Take 1 tablet (100 mg total) by  mouth at bedtime. 90 tablet 2   No current facility-administered medications for this visit.     Musculoskeletal: Strength & Muscle Tone: UTA Gait & Station: UTA Patient leans: N/A  Psychiatric Specialty Exam: Review of Systems  Neurological: Positive for tremors.  Psychiatric/Behavioral: The patient is nervous/anxious.   All other systems reviewed and are negative.   There were no vitals taken for this visit.There is no height or weight on file to calculate BMI.  General Appearance: Casual  Eye Contact:  Fair  Speech:  Normal Rate  Volume:  Normal  Mood:  Anxious  Affect:  Congruent  Thought Process:  Goal Directed and Descriptions of Associations: Intact  Orientation:  Full (Time, Place, and Person)  Thought Content: Logical   Suicidal Thoughts:  No  Homicidal Thoughts:  No  Memory:  Immediate;   Fair Recent;   Fair Remote;   Limited  Judgement:  Fair  Insight:  Fair  Psychomotor Activity:  Tremor  Concentration:  Concentration: Fair and Attention Span: Fair  Recall:  [employer] of Knowledge: Fair  Language: Fair  Akathisia:  No  Handed:  Right  AIMS (if indicated): UTA  Assets:  Community education officer  ADL's:  Intact  Cognition: Baseline  Sleep:  Improving   Screenings: AIMS   Flowsheet Row Office Visit from 01/11/2019 in  [facility] Office Visit from 11/07/2018 in [facility] Office Visit from 09/07/2018 in [facility] Office Visit from 05/31/2018 in [facility] Office Visit from 05/16/2018 in [facility]  AIMS Total Score 5 8 8 8 9     GAD-7   Flowsheet Row Office Visit from 07/07/2018 in [facility]  Total GAD-7 Score 15    PHQ2-9   Flowsheet Row Office Visit from 12/04/2020 in [facility] Office Visit from 04/09/2020 in [facility] Office Visit from 10/10/2019 in [facility] Office Visit from 02/19/2019 in [facility] Office Visit from 07/07/2018 in [place]  PHQ-2 Total Score 0 0 0 0 2  PHQ-9 Total Score -- 0 -- -- 10    Flowsheet Row Counselor from 03/03/2021 in [facility]  C-SSRS RISK CATEGORY No Risk       Assessment and Plan: TYMAR POLYAK is a 55 year old Caucasian male who has a history of schizoaffective disorder, diabetes, GAD, hypothyroidism was evaluated by telemedicine today.  Patient is currently stable on current medication regimen.  Plan as noted below.  Plan Schizoaffective disorder-stable Seroquel 25 mg p.o. nightly Trazodone 100 mg p.o. nightly as needed Celexa 40 mg p.o. daily Propranolol 10 mg p.o. daily as needed for severe anxiety/agitation.  TD-stable Ingrezza 80 mg p.o. daily  Insomnia due to mental disorder-stable Continue trazodone and Seroquel as prescribed Patient is currently working on sleep hygiene techniques and reports sleep has improved.  Other specified anxiety disorder-improving Continue CBT with Ms. Christina Hussami Continue Celexa 40 mg p.o. daily  Collateral information obtained from sister- 57.  Patient to continue day program at [facility].  Follow-up in clinic in person in 2 months  or sooner if needed.  I have spent atleast 25 minutes with patient today which includes the time spent for preparing to see the patient ( e.g., review of test, records ), obtaining and to review and separately obtained history , ordering medications and test ,psychoeducation and supportive psychotherapy and care coordination,as well as  documenting clinical information in electronic health record.  This note was generated in part or whole with voice recognition software. Voice recognition is usually quite accurate but there are transcription errors that can and very often do occur. I apologize for any typographical errors that were not detected and corrected.        Jomarie LongsSaramma Rapheal Masso, MD 03/13/2021, 8:24 AM

## 2021-03-24 ENCOUNTER — Ambulatory Visit (INDEPENDENT_AMBULATORY_CARE_PROVIDER_SITE_OTHER): Payer: Medicare Other | Admitting: Licensed Clinical Social Worker

## 2021-03-24 ENCOUNTER — Other Ambulatory Visit: Payer: Self-pay

## 2021-03-24 DIAGNOSIS — F251 Schizoaffective disorder, depressive type: Secondary | ICD-10-CM

## 2021-03-24 NOTE — Progress Notes (Signed)
Virtual Visit via Video Note  I connected with Andres [name] and sister Andres [name] on 03/24/21 at  1:00 PM EDT by a video enabled telemedicine application and verified that I am speaking with the correct person using two identifiers.  Video connection was lost when less than 50% of the duration of the visit was complete, at which time the remainder of the visit was completed via audio only.   Location: Patient: office Provider: [facility]   I discussed the limitations of evaluation and management by telemedicine and the availability of in person appointments. The patient expressed understanding and agreed to proceed.   I discussed the assessment and treatment plan with the patient. The patient was provided an opportunity to ask questions and all were answered. The patient agreed with the plan and demonstrated an understanding of the instructions.   The patient was advised to call back or seek an in-person evaluation if the symptoms worsen or if the condition fails to improve as anticipated.  I provided 60 minutes of non-face-to-face time during this encounter.   Andres Sumler R Aron Inge, LCSW    THERAPIST PROGRESS NOTE  Session Time: 2-3p  Participation Level: Active  Behavioral Response: NeatAlertAnxious  Type of Therapy: Individual Therapy  Treatment Goals addressed: Coping  Interventions: Solution Focused, Supportive and Reframing  Summary: Andres [name] is a 55 y.o. male who presents with improving symptoms related to schizoaffective disorder diagnosis. Pt reports that overall mood has been stable and that he is managing stress and anxiety well. Pt reports that he is compliant with medication and is managing stress and anxiety fairly well.  Allowed pt to verbalize primary stressors for this session: Andres [name] is continuing to work on impulsivity when going into department stores. "I want to buy it all but I know I can't do that". Pt reports that he is working on his overall self control  and often can go into stores and only purchases his "needs" and not "wants". Praised pts level of self control.  Allowed pt to verbalize anxiety/fear triggers: assisted living facilities and funeral homes. Allowed pt to identify and clarify these triggers and discuss his   Andres [name]/Andres [name]: No  Therapist Response: Andres [name] is continuing to describe in detail current and past experiences with specific fears, prominent worries, and anxiety symptoms including their impact on functioning and how Andres [name] is managing symptoms in the moment. Andres [name] can reflect well major life conflicts from the past and present that form the basis for present anxiety--and can identify what he can do to lessen psychological impact.  Andres [name]'s behavior continues to indicate personal growth and progress.  Treatment to continue.   Plan: Return again in 3 weeks.  Diagnosis: Axis I: Schizoaffective Disorder    Axis II: No diagnosis    Andres [name] Andres Debes, LCSW 03/24/2021

## 2021-04-05 ENCOUNTER — Other Ambulatory Visit: Payer: Self-pay | Admitting: Psychiatry

## 2021-04-05 DIAGNOSIS — F251 Schizoaffective disorder, depressive type: Secondary | ICD-10-CM

## 2021-04-16 ENCOUNTER — Ambulatory Visit (INDEPENDENT_AMBULATORY_CARE_PROVIDER_SITE_OTHER): Payer: Medicare Other | Admitting: Licensed Clinical Social Worker

## 2021-04-16 ENCOUNTER — Other Ambulatory Visit: Payer: Self-pay

## 2021-04-16 DIAGNOSIS — F251 Schizoaffective disorder, depressive type: Secondary | ICD-10-CM | POA: Diagnosis not present

## 2021-04-16 NOTE — Progress Notes (Signed)
Virtual Visit via Video Note  I connected with Andres [name] and sister  Andres [name] on 04/16/21 at  1:00 PM EDT by a video enabled telemedicine application and verified that I am speaking with the correct person using two identifiers.  Location: Patient: sister's work Provider: remote office [place], [place])   I discussed the limitations of evaluation and management by telemedicine and the availability of in person appointments. The patient expressed understanding and agreed to proceed.   I discussed the assessment and treatment plan with the patient. The patient was provided an opportunity to ask questions and all were answered. The patient agreed with the plan and demonstrated an understanding of the instructions.   The patient was advised to call back or seek an in-person evaluation if the symptoms worsen or if the condition fails to improve as anticipated.  I provided 60 minutes of non-face-to-face time during this encounter.   Andres [name] Andres Jamarian Jacinto, LCSW    THERAPIST PROGRESS NOTE  Session Time: 1-2p  Participation Level: Active  Behavioral Response: NeatAlertAnxious  Type of Therapy: Individual Therapy  Treatment Goals addressed: Anxiety  Interventions: Supportive and Family Systems  Summary: Andres [name] is a 55 y.o. male who presents with symptoms consistent with schizoaffective disorder. Currently pt reports that overall mood is stable. Pt also reporting that he is managing overall stress and anxiety well.   Allowed pt and sister to explore and express thoughts and feelings associated with recent external stressors. Andres [name] reports that she feels that the adjustment that Andres [name] has made after his father has been hospitalized for weeks has triggered more independence for Andres [name], since he is the one living alone currently while his father is being hospitalized. Pts sister is very happy about this, because her initial concerns months ago were how Andres [name] could adjust to living  independently if something were to happy to his father in the future. Pts sister reports that Andres [name]'s independent living skills have improved significantly.   Discussed eating habits/nutritional content of food choices, compliance with medication/doctors appointments, and following instructions of medical providers. Pt states that he tries hard to follow instructions.  Pt denies losing weight but visibly seems thinner in this session versus last session.Discussed with sister and pt.  Continued recommendations are as follows: self care behaviors, positive social engagements, focusing on overall work/home/life balance, and focusing on positive physical and emotional wellness.    Suicidal/Homicidal: No  Therapist Response: Andres [name] is continuing to describe in detail current and past experiences with specific fears, prominent worries, and anxiety symptoms including their impact on functioning and how Andres [name] is managing symptoms in the moment. Andres [name] can reflect well major life conflicts from the past and present that form the basis for present anxiety--and can identify what he can do to lessen psychological impact.  Andres [name]'s behavior continues to indicate personal growth and progress. Treatment to continue.   Plan: Return again in 4 weeks.  Diagnosis: Axis I: Schizoaffective Disorder    Axis II: No diagnosis    Andres [name] Andres Hollman, LCSW 04/16/2021

## 2021-04-27 ENCOUNTER — Ambulatory Visit (INDEPENDENT_AMBULATORY_CARE_PROVIDER_SITE_OTHER): Payer: Medicare Other | Admitting: Vascular Surgery

## 2021-04-27 ENCOUNTER — Other Ambulatory Visit: Payer: Self-pay

## 2021-04-27 ENCOUNTER — Encounter: Payer: Self-pay | Admitting: Family Medicine

## 2021-04-27 ENCOUNTER — Encounter (INDEPENDENT_AMBULATORY_CARE_PROVIDER_SITE_OTHER): Payer: Self-pay | Admitting: Vascular Surgery

## 2021-04-27 ENCOUNTER — Telehealth: Payer: Self-pay

## 2021-04-27 VITALS — BP 110/76 | HR 61 | Resp 16 | Wt 149.8 lb

## 2021-04-27 DIAGNOSIS — E039 Hypothyroidism, unspecified: Secondary | ICD-10-CM

## 2021-04-27 DIAGNOSIS — I890 Lymphedema, not elsewhere classified: Secondary | ICD-10-CM

## 2021-04-27 DIAGNOSIS — I10 Essential (primary) hypertension: Secondary | ICD-10-CM

## 2021-04-27 DIAGNOSIS — I872 Venous insufficiency (chronic) (peripheral): Secondary | ICD-10-CM | POA: Diagnosis not present

## 2021-04-27 NOTE — Telephone Encounter (Signed)
Pt sister if requesting a letter of condition for the attorney stating that pt can not drive, unable to take care of his finances.

## 2021-04-27 NOTE — Progress Notes (Signed)
MRN : [id number]  Andres [name] is a 55 y.o. (06-Jul-1966) male who presents with chief complaint of  Chief Complaint  Patient presents with  . Follow-up    3 month follow up  .  History of Present Illness:   The patient returns to the office for followup evaluation regarding leg swelling.  The swelling has persisted but with the lymph pump is much, much better controlled. The pain associated with swelling is essentially eliminated. There have not been any interval development of any ulcerations or wounds.  The patient denies problems with the pump, noting it is working well and the leggings are in good condition.  Since the previous visit the patient has been wearing graduated compression stockings and using the lymph pump on a routine basis and  has noted significant improvement in the lymphedema.   Patient stated the lymph pump has been a very positive factor in her care.   Previous duplex ultrasound shows no evidence of DVT or superficial venous thrombosis seen bilaterally.  No evidence of superficial venous reflux seen bilaterally.  No deep venous insufficiency seen in the right lower extremity.  There is venous reflux noted in the left femoral vein.   Current Meds  Medication Sig  . allopurinol (ZYLOPRIM) 100 MG tablet Take 1 tablet (100 mg total) by mouth daily.  . cholecalciferol (VITAMIN D) 1000 units tablet Take 1,000 Units by mouth daily.  . citalopram (CELEXA) 40 MG tablet Take 1 tablet (40 mg total) by mouth daily.  . fexofenadine (ALLEGRA) 180 MG tablet Take 180 mg by mouth.  . INGREZZA 80 MG CAPS TAKE 1 CAPSULE BY MOUTH AT BEDTIME  . levothyroxine (SYNTHROID) 25 MCG tablet TAKE 1 TABLET BY MOUTH DAILY BEFORE BREAKFAST ON AN EMPTY STOMACH( NOT TO TAKE WITH OTHER MEDICINES)  . propranolol (INDERAL) 10 MG tablet Take 1 tablet (10 mg total) by mouth 2 (two) times daily as needed. For severe anxiety/agitation  . QUEtiapine (SEROQUEL) 25 MG tablet Take 1 tablet (25 mg  total) by mouth at bedtime.  . thiamine (VITAMIN B-1) 100 MG tablet Take 100 mg by mouth daily.  . traZODone (DESYREL) 100 MG tablet Take 1 tablet (100 mg total) by mouth at bedtime.    Past Medical History:  Diagnosis Date  . Allergy     Past Surgical History:  Procedure Laterality Date  . ANKLE SURGERY    . COLON SURGERY    . HERNIA REPAIR    . INTRAMEDULLARY (IM) NAIL INTERTROCHANTERIC Right 06/18/2019   Procedure: INTRAMEDULLARY (IM) NAIL INTERTROCHANTRIC;  Surgeon: Lyndle Herrlich, MD;  Location: [facility] ORS;  Service: Orthopedics;  Laterality: Right;  . TONSILLECTOMY      Social History Social History   Tobacco Use  . Smoking status: Never Smoker  . Smokeless tobacco: Never Used  Vaping Use  . Vaping Use: Never used  Substance Use Topics  . Alcohol use: No  . Drug use: No    Family History Family History  Problem Relation Age of Onset  . Diabetes Mother   . Stroke Mother   . Cancer Father        colon  . Colon cancer Father   . Colon cancer Other   . Mental illness Neg Hx     Allergies  Allergen Reactions  . Prednisone Other (See Comments)    Did not like the way it made him feel   . Lamotrigine Rash     REVIEW OF SYSTEMS (Negative unless  checked)  Constitutional: [] Weight loss  [] Fever  [] Chills Cardiac: [] Chest pain   [] Chest pressure   [] Palpitations   [] Shortness of breath when laying flat   [] Shortness of breath with exertion. Vascular:  [] Pain in legs with walking   [] Pain in legs at rest  [] History of DVT   [] Phlebitis   [x] Swelling in legs   [] Varicose veins   [] Non-healing ulcers Pulmonary:   [] Uses home oxygen   [] Productive cough   [] Hemoptysis   [] Wheeze  [] COPD   [] Asthma Neurologic:  [] Dizziness   [] Seizures   [] History of stroke   [] History of TIA  [] Aphasia   [] Vissual changes   [] Weakness or numbness in arm   [x] Weakness or numbness in leg Musculoskeletal:   [] Joint swelling   [] Joint pain   [] Low back pain Hematologic:  [] Easy bruising   [] Easy bleeding   [] Hypercoagulable state   [] Anemic Gastrointestinal:  [] Diarrhea   [] Vomiting  [] Gastroesophageal reflux/heartburn   [] Difficulty swallowing. Genitourinary:  [] Chronic kidney disease   [] Difficult urination  [] Frequent urination   [] Blood in urine Skin:  [] Rashes   [] Ulcers  Psychological:  [] History of anxiety   [x]  History of major depression.  Physical Examination  Vitals:   04/27/21 1052  BP: 110/76  Pulse: 61  Resp: 16  Weight: 149 lb 12.8 oz (67.9 kg)   Body mass index is 24.18 kg/m. Gen: WD/WN, NAD Head: [place]/AT, No temporalis wasting.  Ear/Nose/Throat: Hearing grossly intact, nares w/o erythema or drainage Eyes: PER, EOMI, sclera nonicteric.  Neck: Supple, no large masses.   Pulmonary:  Good air movement, no audible wheezing bilaterally, no use of accessory muscles.  Cardiac: RRR, no JVD Vascular: scattered varicosities present bilaterally.  Mild venous stasis changes to the legs bilaterally.  Minimal /trace edema Vessel Right Left  Radial Palpable Palpable  Gastrointestinal: Non-distended. No guarding/no peritoneal signs.  Musculoskeletal: M/S 5/5 throughout.  No deformity or atrophy.  Neurologic: CN 2-12 intact. Symmetrical.  Speech is fluent. Motor exam as listed above. Psychiatric: Judgment intact, Mood & affect appropriate for pt's clinical situation. Dermatologic: mild venous rashes no ulcers noted.  No changes consistent with cellulitis. Lymph : No lichenification or skin changes of chronic lymphedema.  CBC Lab Results  Component Value Date   WBC 4.6 10/08/2020   HGB 15.8 10/08/2020   HCT 47.0 10/08/2020   MCV 92.5 10/08/2020   PLT 122 (L) 10/08/2020    BMET    Component Value Date/Time   NA 145 10/08/2020 0820   K 3.7 10/08/2020 0820   CL 109 10/08/2020 0820   CO2 26 10/08/2020 0820   GLUCOSE 77 10/08/2020 0820   BUN 18 10/08/2020 0820   CREATININE 1.03 10/08/2020 0820   CALCIUM 9.3 10/08/2020 0820   GFRNONAA 81 10/08/2020 0820    GFRAA 94 10/08/2020 0820   CrCl cannot be calculated (Patient's most recent lab result is older than the maximum 21 days allowed.).  COAG Lab Results  Component Value Date   INR 0.9 06/17/2019    Radiology No results found.   Assessment/Plan 1. Lymphedema  No surgery or intervention at this point in time.    I have reviewed my discussion with the patient regarding lymphedema and why it  causes symptoms.  Patient will continue wearing graduated compression stockings class 1 (20-30 mmHg) on a daily basis a prescription was given. The patient is reminded to put the stockings on first thing in the morning and removing them in the evening. The patient is instructed  specifically not to sleep in the stockings.   In addition, behavioral modification throughout the day will be continued.  This will include frequent elevation (such as in a recliner), use of over the counter pain medications as needed and exercise such as walking.  I have reviewed systemic causes for chronic edema such as liver, kidney and cardiac etiologies and there does not appear to be any significant changes in these organ systems over the past year.  The patient is under the impression that these organ systems are all stable and unchanged.    The patient will continue aggressive use of the  lymph pump.  This will continue to improve the edema control and prevent sequela such as ulcers and infections.   The patient will follow-up with me on an annual basis.    2. Chronic venous insufficiency  No surgery or intervention at this point in time.    I have reviewed my discussion with the patient regarding lymphedema and why it  causes symptoms.  Patient will continue wearing graduated compression stockings class 1 (20-30 mmHg) on a daily basis a prescription was given. The patient is reminded to put the stockings on first thing in the morning and removing them in the evening. The patient is instructed specifically not to sleep  in the stockings.   In addition, behavioral modification throughout the day will be continued.  This will include frequent elevation (such as in a recliner), use of over the counter pain medications as needed and exercise such as walking.  I have reviewed systemic causes for chronic edema such as liver, kidney and cardiac etiologies and there does not appear to be any significant changes in these organ systems over the past year.  The patient is under the impression that these organ systems are all stable and unchanged.    The patient will continue aggressive use of the  lymph pump.  This will continue to improve the edema control and prevent sequela such as ulcers and infections.   The patient will follow-up with me on an annual basis.    3. Essential hypertension Continue antihypertensive medications as already ordered, these medications have been reviewed and there are no changes at this time.   4. Acquired hypothyroidism Continue hormone replacement as ordered and reviewed, no changes at this time   Levora Dredge, MD  04/27/2021 10:58 AM

## 2021-04-27 NOTE — Telephone Encounter (Signed)
Called Tonette Bihari (patient sister) and discussed. She gave me information regarding this request and I have submitted a letter to his mychart. She can review it and print it and if needs a signed copy by me she will let us know and I can print / sign it to have available for her if needed.  Saralyn Pilar, DO [facility] 04/27/2021, 11:40 AM

## 2021-05-05 ENCOUNTER — Other Ambulatory Visit: Payer: Self-pay

## 2021-05-05 ENCOUNTER — Encounter: Payer: Self-pay | Admitting: Psychiatry

## 2021-05-05 ENCOUNTER — Ambulatory Visit (INDEPENDENT_AMBULATORY_CARE_PROVIDER_SITE_OTHER): Payer: Medicare Other | Admitting: Psychiatry

## 2021-05-05 VITALS — BP 111/74 | HR 61 | Temp 97.8°F | Ht 64.5 in | Wt 150.8 lb

## 2021-05-05 DIAGNOSIS — F251 Schizoaffective disorder, depressive type: Secondary | ICD-10-CM

## 2021-05-05 DIAGNOSIS — F5105 Insomnia due to other mental disorder: Secondary | ICD-10-CM

## 2021-05-05 DIAGNOSIS — F418 Other specified anxiety disorders: Secondary | ICD-10-CM

## 2021-05-05 DIAGNOSIS — G2401 Drug induced subacute dyskinesia: Secondary | ICD-10-CM | POA: Diagnosis not present

## 2021-05-05 DIAGNOSIS — F70 Mild intellectual disabilities: Secondary | ICD-10-CM

## 2021-05-05 MED ORDER — PROPRANOLOL HCL 10 MG PO TABS: 10.0000 mg | ORAL_TABLET | Freq: Two times a day (BID) | ORAL | 2 refills | Status: DC | PRN

## 2021-05-05 MED ORDER — QUETIAPINE FUMARATE 25 MG PO TABS: 25.0000 mg | ORAL_TABLET | Freq: Every day | ORAL | 2 refills | Status: DC

## 2021-05-05 NOTE — Progress Notes (Signed)
BH MD OP Progress Note  05/05/2021 1:46 PM Andres [name]  MRN:  [id number]  Chief Complaint:  Chief Complaint    Follow-up; Depression     HPI: Andres [name] is a 55 year old Caucasian male who has a history of schizoaffective disorder, GAD, hypothyroidism, mild intellectual disability was evaluated in office today.  Collateral information obtained from sister Lupita Leash.  Patient today appeared to be pleasant.  Patient reports overall he is doing well.  He currently stays at home by himself however does have an aide coming in to help him out with household chores Monday through Friday 5 hours a day.  That has been working out well.  He also goes to a day program-[employer], 2 days a week.  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  He hence has been busy.  Patient does report he is compliant on medications.  Denies side effects.  He reports sleep is overall okay.  He takes a short nap during the day.  Patient denies any suicidality, homicidality or perceptual disturbances.  He has good support system from his family.  Patient reports his dad is recovering however continues to be in the assisted living facility.   Patient continues to be compliant on the Ingrezza and reports he is able to feed himself and use his hands without any problems since being on the Ingrezza.  Denies side effects.  Patient denies any other concerns today.  Visit Diagnosis:    ICD-10-CM   1. Schizoaffective disorder, depressive type (HCC)  F25.1 QUEtiapine (SEROQUEL) 25 MG tablet    propranolol (INDERAL) 10 MG tablet  2. 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 Mild intellectual disability  F70     Past Psychiatric History: I have reviewed past psychiatric history from progress note on 03/29/2018  Past Medical History:  Past Medical History:   Diagnosis Date  . Allergy     Past Surgical History:  Procedure Laterality Date  . ANKLE SURGERY    . COLON SURGERY    . HERNIA REPAIR    . INTRAMEDULLARY (IM) NAIL INTERTROCHANTERIC Right 06/18/2019   Procedure: INTRAMEDULLARY (IM) NAIL INTERTROCHANTRIC;  Surgeon: Lyndle Herrlich, MD;  Location: [facility] ORS;  Service: Orthopedics;  Laterality: Right;  . TONSILLECTOMY      Family Psychiatric History: Reviewed family psychiatric history from progress note on 03/29/2018  Family History:  Family History  Problem Relation Age of Onset  . Diabetes Mother   . Stroke Mother   . Cancer Father        colon  . Colon cancer Father   . Colon cancer Other   . Mental illness Neg Hx     Social History: Reviewed social history from progress note on 03/29/2018 Social History   Socioeconomic History  . Marital status: Single    Spouse name: Not on file  . Number of children: 0  . Years of education: [employer]  . Highest education level: High school graduate  Occupational History    Comment: disabled  Tobacco Use  . Smoking status: Never Smoker  . Smokeless tobacco: Never Used  Vaping Use  . Vaping Use: Never used  Substance and Sexual Activity  . Alcohol use: No  . Drug use: No  . Sexual activity: Not Currently  Other Topics Concern  .  Not on file  Social History Narrative   Lives with parents, mental handicap, uses walker   Social Determinants of Health   Financial Resource Strain: Not on file  Food Insecurity: Not on file  Transportation Needs: Not on file  Physical Activity: Not on file  Stress: Not on file  Social Connections: Not on file    Allergies:  Allergies  Allergen Reactions  . Prednisone Other (See Comments)    Did not like the way it made him feel   . Lamotrigine Rash    Metabolic Disorder Labs: Lab Results  Component Value Date   HGBA1C 4.8 09/24/2019   MPG 91 09/24/2019   MPG 91 10/05/2018   No results found for: PROLACTIN Lab Results   Component Value Date   CHOL 143 10/08/2020   TRIG 101 10/08/2020   HDL 46 10/08/2020   CHOLHDL 3.1 10/08/2020   LDLCALC 79 10/08/2020   LDLCALC 89 09/24/2019   Lab Results  Component Value Date   TSH 4.47 10/08/2020   TSH 4.92 (H) 04/02/2020    Therapeutic Level Labs: No results found for: LITHIUM No results found for: VALPROATE No components found for:  CBMZ  Current Medications: Current Outpatient Medications  Medication Sig Dispense Refill  . allopurinol (ZYLOPRIM) 100 MG tablet Take 1 tablet (100 mg total) by mouth daily. 90 tablet 3  . cholecalciferol (VITAMIN D) 1000 units tablet Take 1,000 Units by mouth daily.    . citalopram (CELEXA) 40 MG tablet Take 1 tablet (40 mg total) by mouth daily. 90 tablet 2  . fexofenadine (ALLEGRA) 180 MG tablet Take 180 mg by mouth.    . INGREZZA 80 MG CAPS TAKE 1 CAPSULE BY MOUTH AT BEDTIME 30 capsule 7  . levothyroxine (SYNTHROID) 25 MCG tablet TAKE 1 TABLET BY MOUTH DAILY BEFORE BREAKFAST ON AN EMPTY STOMACH( NOT TO TAKE WITH OTHER MEDICINES) 90 tablet 3  . thiamine (VITAMIN B-1) 100 MG tablet Take 100 mg by mouth daily.    . traZODone (DESYREL) 100 MG tablet Take 1 tablet (100 mg total) by mouth at bedtime. 90 tablet 2  . propranolol (INDERAL) 10 MG tablet Take 1 tablet (10 mg total) by mouth 2 (two) times daily as needed. For severe anxiety/agitation 180 tablet 2  . QUEtiapine (SEROQUEL) 25 MG tablet Take 1 tablet (25 mg total) by mouth at bedtime. 90 tablet 2   No current facility-administered medications for this visit.     Musculoskeletal: Strength & Muscle Tone: UTA Gait & Station: Walks unsteady - chronic , baseline for patient Patient leans: N/A  Psychiatric Specialty Exam: Review of Systems  Psychiatric/Behavioral: Negative for agitation, behavioral problems, confusion, decreased concentration, dysphoric mood, hallucinations, self-injury, sleep disturbance and suicidal ideas. The patient is not nervous/anxious and is not  hyperactive.   All other systems reviewed and are negative.   Blood pressure 111/74, pulse 61, temperature 97.8 F (36.6 C), height 5' 4.5" (1.638 m), weight 150 lb 12.8 oz (68.4 kg), SpO2 99 %.Body mass index is 25.49 kg/m.  General Appearance: Casual  Eye Contact:  Fair  Speech:  Clear and Coherent  Volume:  Normal  Mood:  Euthymic  Affect:  Congruent  Thought Process:  Goal Directed and Descriptions of Associations: Intact  Orientation:  Full (Time, Place, and Person)  Thought Content: Logical   Suicidal Thoughts:  No  Homicidal Thoughts:  No  Memory:  Immediate;   Fair Recent;   Fair Remote;   Limited  Judgement:  Fair  Insight:  Shallow  Psychomotor Activity:  Normal  Concentration:  Concentration: Fair and Attention Span: Fair  Recall:  [employer] of Knowledge: Fair  Language: Fair  Akathisia:  No  Handed:  Right  AIMS (if indicated): done  Assets:  Communication Skills Desire for Improvement Housing Social Support Talents/Skills  ADL's:  Intact  Cognition: Baseline  Sleep:  Fair   Screenings: AIMS   Flowsheet Row Office Visit from 05/05/2021 in [facility] Office Visit from 01/11/2019 in [facility] Office Visit from 11/07/2018 in [facility] Office Visit from 09/07/2018 in [facility] Office Visit from 05/31/2018 in [facility]  AIMS Total Score 4 5 8 8 8     GAD-7   Flowsheet Row Office Visit from 07/07/2018 in [facility]  Total GAD-7 Score 15    PHQ2-9   Flowsheet Row Office Visit from 05/05/2021 in [facility] Counselor from 04/16/2021 in [facility] Counselor from 03/24/2021 in [facility] Office Visit from 12/04/2020 in [facility] Office Visit from 04/09/2020 in [facility]  PHQ-2 Total  Score 1 0 0 0 0  PHQ-9 Total Score 3 -- -- -- 0    Flowsheet Row Office Visit from 05/05/2021 in [facility] Counselor from 04/16/2021 in [facility] Counselor from 03/24/2021 in [facility]  C-SSRS RISK CATEGORY No Risk No Risk No Risk       Assessment and Plan: KARMAN VENEY is a 55 year old Caucasian male who has a history of schizoaffective disorder, diabetes, GAD, hypothyroidism was evaluated in office today.  Patient is currently stable on current medication regimen.  Plan as noted below.  Plan Schizoaffective disorder-stable Seroquel 25 mg p.o. nightly Trazodone 100 mg p.o. nightly as needed Celexa 40 mg p.o. daily Propranolol 10 mg p.o. daily as needed for severe anxiety/agitation  TD-stable Ingrezza 80 mg p.o. daily  Insomnia-stable Trazodone and Seroquel as prescribed  Other specified anxiety disorder-improving Continue CBT with Ms. Christina Hussami Continue Celexa 40 mg p.o. daily  Collateral information obtained from sister-[name]  Patient to continue day program.  Follow-up in clinic in 3 months or sooner if needed.  This note was generated in part or whole with voice recognition software. Voice recognition is usually quite accurate but there are transcription errors that can and very often do occur. I apologize for any typographical errors that were not detected and corrected.      57, MD 05/06/2021, 8:46 PM

## 2021-05-17 ENCOUNTER — Other Ambulatory Visit: Payer: Self-pay | Admitting: Psychiatry

## 2021-05-17 DIAGNOSIS — F418 Other specified anxiety disorders: Secondary | ICD-10-CM

## 2021-05-22 ENCOUNTER — Other Ambulatory Visit: Payer: Self-pay

## 2021-05-22 ENCOUNTER — Ambulatory Visit (INDEPENDENT_AMBULATORY_CARE_PROVIDER_SITE_OTHER): Payer: Medicare Other | Admitting: Licensed Clinical Social Worker

## 2021-05-22 DIAGNOSIS — F251 Schizoaffective disorder, depressive type: Secondary | ICD-10-CM | POA: Diagnosis not present

## 2021-05-22 NOTE — Progress Notes (Signed)
Virtual Visit via Video Note  I connected with Andres [name] on 05/22/21 at 12:00 PM EDT by a video enabled telemedicine application and verified that I am speaking with the correct person using two identifiers.  Location: Patient: home Provider: remote office Andres [name], [place])   I discussed the limitations of evaluation and management by telemedicine and the availability of in person appointments. The patient expressed understanding and agreed to proceed.  I discussed the assessment and treatment plan with the patient. The patient was provided an opportunity to ask questions and all were answered. The patient agreed with the plan and demonstrated an understanding of the instructions.   The patient was advised to call back or seek an in-person evaluation if the symptoms worsen or if the condition fails to improve as anticipated.  I provided 45 minutes of non-face-to-face time during this encounter.   Andres [name] R Andres Shamoon, LCSW    THERAPIST PROGRESS NOTE  Session Time: 12-12:45p  Participation Level: Active  Behavioral Response: NeatAlertAnxious  Type of Therapy: Individual Therapy  Treatment Goals addressed: Anxiety  Interventions: Other: grief/loss  Summary: Andres [name] is a 55 y.o. male who presents with continuing symptoms related to schizoaffective disorder. Andres [name] reports that his mood is up and down. Pt reports most recent trigger was the death of his father.   Pts sister stated that Andres [name] mentioned to one of the aides that he would like to do his counseling session without his sister present--so his sister left the room throughout the duration of his session.  Allowed pt to explore and express thoughts and feelings associated with the death of his father--Andres [name] reports that he is happy that he got to speak with his father a couple of days before he passed away.  "he wanted to come home--he wasn't happy there in that place". Andres [name] reports that he knows his father is in  heaven so he is not sad about his death. Pt states that he wouldn't have been able to take care of his father if his father came home.   Discussed Barrys days that he goes to [employer], and how much he enjoys the socialization and bible-based study that he's learning there.    Andres [name] discussed his Andres [name] and how confident he is that he has tried to make good choices his entire life, and feels like he is a good person overall. "I'm sure if something would happen to me, like the Rapture, that I will go to heaven". Pt confirms that he is not thinking about harming himself.   Reviewed coping skills and ways that Andres [name] can help himself if his thoughts are racing, if his speech is pressured, or feeling either overly excited or depressed. "I don't really feel depressed a lot". Offered pt unconditional support and validated his thoughts and feelings.  Reviewed nutritional intake, chronic pain levels, any recent sleep changes. Pt reporting all is stable.    Continued recommendations are as follows: self care behaviors, positive social engagements, focusing on overall work/home/life balance, and focusing on positive physical and emotional wellness.    Suicidal/Homicidal: No  Therapist Response: Andres [name] is continuing to describe in detail current and past experiences with specific fears, prominent worries, and anxiety symptoms including their impact on functioning and how Andres [name] is managing symptoms in the moment.Treatment to continue.  Plan: Return again in 3 weeks.  Diagnosis: Axis I: Schizoaffective Disorder    Axis II: No diagnosis    Andres [name] Andres Duford, LCSW 05/22/2021

## 2021-05-22 NOTE — Patient Instructions (Signed)
Managing Loss, Adult People experience loss in many different ways throughout their lives. Events such as moving, changing jobs, and losing friends can create a sense of loss. The loss may be as serious as a major health change, divorce, death of a pet, or death of a loved one. All of these types of loss are likely to create a physical and emotional reaction known as grief. Grief is the result of a major change or an absence of something or someone that you count on. Grief is a normal reaction to loss. A variety of factors can affect your grieving experience, including:  The nature of your loss.  Your relationship to what or whom you lost.  Your understanding of grief and how to manage it.  Your support system. How to manage lifestyle changes Keep to your normal routine as much as possible.  If you have trouble focusing or doing normal activities, it is acceptable to take some time away from your normal routine.  Spend time with friends and loved ones.  Eat a healthy diet, get plenty of sleep, and rest when you feel tired.   How to recognize changes  The way that you deal with your grief will affect your ability to function as you normally do. When grieving, you may experience these changes:  Numbness, shock, sadness, anxiety, anger, denial, and guilt.  Thoughts about death.  Unexpected crying.  A physical sensation of emptiness in your stomach.  Problems sleeping and eating.  Tiredness (fatigue).  Loss of interest in normal activities.  Dreaming about or imagining seeing the person who died.  A need to remember what or whom you lost.  Difficulty thinking about anything other than your loss for a period of time.  Relief. If you have been expecting the loss for a while, you may feel a sense of relief when it happens. Follow these instructions at home: Activity Express your feelings in healthy ways, such as:  Talking with others about your loss. It may be helpful to find  others who have had a similar loss, such as a support group.  Writing down your feelings in a journal.  Doing physical activities to release stress and emotional energy.  Doing creative activities like painting, sculpting, or playing or listening to music.  Practicing resilience. This is the ability to recover and adjust after facing challenges. Reading some resources that encourage resilience may help you to learn ways to practice those behaviors.   General instructions  Be patient with yourself and others. Allow the grieving process to happen, and remember that grieving takes time. ? It is likely that you may never feel completely done with some grief. You may find a way to move on while still cherishing memories and feelings about your loss. ? Accepting your loss is a process. It can take months or longer to adjust.  Keep all follow-up visits as told by your health care provider. This is important. Where to find support To get support for managing loss:  Ask your health care provider for help and recommendations, such as grief counseling or therapy.  Think about joining a support group for people who are managing a loss. Where to find more information You can find more information about managing loss from:  American Society of Clinical Oncology: www.cancer.net  American Psychological Association: www.apa.org Contact a health care provider if:  Your grief is extreme and keeps getting worse.  You have ongoing grief that does not improve.  Your body shows symptoms   of grief, such as illness.  You feel depressed, anxious, or lonely. Get help right away if:  You have thoughts about hurting yourself or others. If you ever feel like you may hurt yourself or others, or have thoughts about taking your own life, get help right away. You can go to your nearest emergency department or call:  Your local emergency services (911 in the [place]  A suicide crisis helpline, such as the  National Suicide Prevention Lifeline at 1-800-273-8255. This is open 24 hours a day. Summary  Grief is the result of a major change or an absence of someone or something that you count on. Grief is a normal reaction to loss.  The depth of grief and the period of recovery depend on the type of loss and your ability to adjust to the change and process your feelings.  Processing grief requires patience and a willingness to accept your feelings and talk about your loss with people who are supportive.  It is important to find resources that work for you and to realize that people experience grief differently. There is not one grieving process that works for everyone in the same way.  Be aware that when grief becomes extreme, it can lead to more severe issues like isolation, depression, anxiety, or suicidal thoughts. Talk with your health care provider if you have any of these issues. This information is not intended to replace advice given to you by your health care provider. Make sure you discuss any questions you have with your health care provider. Document Revised: 06/05/2020 Document Reviewed: 06/05/2020 Elsevier Patient Education  2021 Elsevier Inc.  

## 2021-06-08 ENCOUNTER — Other Ambulatory Visit: Payer: Self-pay | Admitting: Psychiatry

## 2021-06-08 ENCOUNTER — Other Ambulatory Visit: Payer: Self-pay | Admitting: Family Medicine

## 2021-06-08 DIAGNOSIS — E039 Hypothyroidism, unspecified: Secondary | ICD-10-CM

## 2021-06-08 DIAGNOSIS — F418 Other specified anxiety disorders: Secondary | ICD-10-CM

## 2021-06-08 DIAGNOSIS — F251 Schizoaffective disorder, depressive type: Secondary | ICD-10-CM

## 2021-06-08 DIAGNOSIS — M1A9XX1 Chronic gout, unspecified, with tophus (tophi): Secondary | ICD-10-CM

## 2021-06-08 MED ORDER — TRAZODONE HCL 100 MG PO TABS: 100.0000 mg | ORAL_TABLET | Freq: Every day | ORAL | 2 refills | Status: DC

## 2021-06-08 MED ORDER — CITALOPRAM HYDROBROMIDE 40 MG PO TABS: ORAL_TABLET | ORAL | 2 refills | Status: AC

## 2021-06-08 MED ORDER — PROPRANOLOL HCL 10 MG PO TABS: 10.0000 mg | ORAL_TABLET | Freq: Two times a day (BID) | ORAL | 2 refills | Status: DC | PRN

## 2021-06-08 MED ORDER — QUETIAPINE FUMARATE 25 MG PO TABS: 25.0000 mg | ORAL_TABLET | Freq: Every day | ORAL | 2 refills | Status: DC

## 2021-06-08 MED ORDER — LEVOTHYROXINE SODIUM 25 MCG PO TABS: ORAL_TABLET | ORAL | 3 refills | Status: AC

## 2021-06-08 NOTE — Telephone Encounter (Signed)
Requested medication (s) are due for refill today: yes  Requested medication (s) are on the active medication list: yes   Last refill: 03/06/2021  Future visit scheduled: no  Notes to clinic: Failed protocol: TSH needs to be rechecked within 3 months after an abnormal result. Refill until TSH is due Last labs were 09/2020   Requested Prescriptions  Pending Prescriptions Disp Refills   levothyroxine (SYNTHROID) 25 MCG tablet [Pharmacy Med Name: LEVOTHYROXINE 0.025MG  ( ) TAB] 90 tablet 3    Sig: TAKE 1 TABLET BY MOUTH DAILY BEFORE BREAKFAST ON AN EMPTY STOMACH( NOT TO TAKE WITH OTHER MEDICINES)      Endocrinology:  Hypothyroid Agents Failed - 06/08/2021 11:02 AM      Failed - TSH needs to be rechecked within 3 months after an abnormal result. Refill until TSH is due.      Passed - TSH in normal range and within 360 days    TSH  Date Value Ref Range Status  10/08/2020 4.47 0.40 - 4.50 mIU/L Final          Passed - Valid encounter within last 12 months    Recent Outpatient Visits           6 months ago Abnormal gait   [facility] [place], Netta Neat, DO   7 months ago Annual physical exam   [facility] Smitty Cords, DO   1 year ago Schizoaffective disorder, depressive type [facility])   [facility] Smitty Cords, DO   1 year ago Annual physical exam   [facility] Smitty Cords, DO   1 year ago Closed right hip fracture, with routine healing, subsequent encounter   [facility] [place], Netta Neat, DO

## 2021-06-08 NOTE — Telephone Encounter (Signed)
I have sent all his medications again to [facility] except the Ingrezza.

## 2021-06-08 NOTE — Telephone Encounter (Signed)
  Notes to clinic: this script has expired  Review for continued use and refill    Requested Prescriptions  Pending Prescriptions Disp Refills   allopurinol (ZYLOPRIM) 100 MG tablet [Pharmacy Med Name: ALLOPURINOL 100MG  TABLETS] 90 tablet 3    Sig: TAKE 1 TABLET(100 MG) BY MOUTH DAILY      Endocrinology:  Gout Agents Passed - 06/08/2021 11:06 AM      Passed - Uric Acid in normal range and within 360 days    Uric Acid, Serum  Date Value Ref Range Status  10/08/2020 5.5 4.0 - 8.0 mg/dL Final    Comment:    Therapeutic target for gout patients: <6.0 mg/dL .           Passed - Cr in normal range and within 360 days    Creat  Date Value Ref Range Status  10/08/2020 1.03 0.70 - 1.33 mg/dL Final    Comment:    For patients >56 years of age, the reference limit for Creatinine is approximately 13% higher for people identified as African-American. 52 - Valid encounter within last 12 months    Recent Outpatient Visits           6 months ago Abnormal gait   [facility] [place], [place], DO   7 months ago Annual physical exam   [facility], DO   1 year ago Schizoaffective disorder, depressive type [facility])   [facility], DO   1 year ago Annual physical exam   [facility], DO   1 year ago Closed right hip fracture, with routine healing, subsequent encounter   [facility] [place], [place], Netta Neat

## 2021-06-08 NOTE — Telephone Encounter (Signed)
pt sister called left a message that the pharmacy lost all refills for her brother. they had some kind of system failure and they need all new rxs resent.

## 2021-06-22 ENCOUNTER — Ambulatory Visit (INDEPENDENT_AMBULATORY_CARE_PROVIDER_SITE_OTHER): Payer: Medicare Other | Admitting: Licensed Clinical Social Worker

## 2021-06-22 ENCOUNTER — Other Ambulatory Visit: Payer: Self-pay

## 2021-06-22 DIAGNOSIS — F251 Schizoaffective disorder, depressive type: Secondary | ICD-10-CM

## 2021-06-22 NOTE — Progress Notes (Signed)
Virtual Visit via Video Note  I connected with Andres [name] on 06/22/21 at  1:00 PM EDT by a video enabled telemedicine application and verified that I am speaking with the correct person using two identifiers.  Location: Patient: sister's workplace Provider: remote office [place], [place])   I discussed the limitations of evaluation and management by telemedicine and the availability of in person appointments. The patient expressed understanding and agreed to proceed.  I discussed the assessment and treatment plan with the patient. The patient was provided an opportunity to ask questions and all were answered. The patient agreed with the plan and demonstrated an understanding of the instructions.   The patient was advised to call back or seek an in-person evaluation if the symptoms worsen or if the condition fails to improve as anticipated.  I provided 45 minutes of non-face-to-face time during this encounter.   Andres Heritage R Alexavier Tsutsui, LCSW   THERAPIST PROGRESS NOTE  Session Time: 1-1:45p  Participation Level: Active  Behavioral Response: Neat and Well GroomedAlertAnxious  Type of Therapy: Individual Therapy  Treatment Goals addressed: Anxiety  Interventions: Social Skills Training  Summary: Andres [name] is a 55 y.o. male who presents with Improving symptoms related to schizoaffective disorder diagnosis. Checked in initially with patient sister, and her concerns were that funds have not been released from the father's estate, and she is fearful that patient does not have any financial support, and that he is worrying about it. Discuss this stressor with patient, and patient did not report any concerns regarding finances.   Patient reports that overall mood has been stable, and that he feels he is managing his stress on a day to day basis. Patient feels well supported by caregivers and family members.  Patient reports that he is engaging socially with individuals that attend the  [facility] [facility] program. Patient reports he is excited about an upcoming cookout and boat ride that several people are going on to celebrate the 4th of July.   Discussed other recreational activities that patient enjoys, and he does feel like his physical limitations keep him from doing things that he enjoys like walking around in stores.  Patient reports good appetite, and good quality and quantity of sleep. Patient finds comfort in the Bible, and church related activities.  Continued recommendations are as follows: self care behaviors, positive social engagements, focusing on overall work/home/life balance, and focusing on positive physical and emotional wellness.   Suicidal/Homicidal: No  Therapist Response: Andres [name] is continuing to describe in detail current and past experiences with specific fears, prominent worries, and anxiety symptoms including their impact on functioning and how Andres [name] is managing symptoms in the moment. Treatment to continue.  Plan: Return again in 4 weeks.  Diagnosis: Axis I: Schizoaffective Disorder    Axis II: No diagnosis    Andres [name] Andres Dona, LCSW 06/22/2021

## 2021-07-24 ENCOUNTER — Other Ambulatory Visit: Payer: Self-pay | Admitting: Psychiatry

## 2021-07-24 DIAGNOSIS — G2401 Drug induced subacute dyskinesia: Secondary | ICD-10-CM

## 2021-08-03 ENCOUNTER — Telehealth: Payer: Self-pay | Admitting: Family Medicine

## 2021-08-03 NOTE — Telephone Encounter (Signed)
Left message for patient to call back and schedule Medicare Annual Wellness Visit (AWV) to be done virtually or by telephone.  No hx of AWV eligible as of 04/27/2015 awvi  Please schedule at anytime with [facility].      40 Minutes appointment   Any questions, please call me at [phone]

## 2021-08-04 ENCOUNTER — Encounter: Payer: Self-pay | Admitting: Psychiatry

## 2021-08-04 ENCOUNTER — Other Ambulatory Visit: Payer: Self-pay

## 2021-08-04 ENCOUNTER — Ambulatory Visit (INDEPENDENT_AMBULATORY_CARE_PROVIDER_SITE_OTHER): Payer: Medicare Other | Admitting: Psychiatry

## 2021-08-04 VITALS — BP 117/73 | HR 64 | Temp 98.1°F | Wt 144.8 lb

## 2021-08-04 DIAGNOSIS — F251 Schizoaffective disorder, depressive type: Secondary | ICD-10-CM | POA: Diagnosis not present

## 2021-08-04 DIAGNOSIS — Z79899 Other long term (current) drug therapy: Secondary | ICD-10-CM

## 2021-08-04 DIAGNOSIS — G4710 Hypersomnia, unspecified: Secondary | ICD-10-CM

## 2021-08-04 DIAGNOSIS — Z9189 Other specified personal risk factors, not elsewhere classified: Secondary | ICD-10-CM

## 2021-08-04 DIAGNOSIS — Z634 Disappearance and death of family member: Secondary | ICD-10-CM

## 2021-08-04 DIAGNOSIS — F418 Other specified anxiety disorders: Secondary | ICD-10-CM | POA: Diagnosis not present

## 2021-08-04 DIAGNOSIS — G2401 Drug induced subacute dyskinesia: Secondary | ICD-10-CM | POA: Diagnosis not present

## 2021-08-04 MED ORDER — TRAZODONE HCL 50 MG PO TABS: 75.0000 mg | ORAL_TABLET | Freq: Every day | ORAL | 0 refills | Status: DC

## 2021-08-04 NOTE — Patient Instructions (Signed)
Please call for EKG scheduling - [phone].

## 2021-08-04 NOTE — Progress Notes (Signed)
BH MD OP Progress Note  08/04/2021 1:02 PM Andres [name]  MRN:  [id number]  Chief Complaint:  Chief Complaint   Follow-up    HPI: Andres [name] is a 55 year old Caucasian male who has a history of schizoaffective disorder, hypothyroidism, mild intellectual disability, tardive dyskinesia was evaluated in office today.  History obtained from sister-[name]-95% From patient-5%  Patient appeared to be pleasant, was able to give the correct month, year, date and with some support and prompting.  Patient was also able to give his date of birth as well as his address correctly.  Patient appeared to be alert, oriented throughout the session.  Patient however continued to need redirection, was redirectable.  Patient reports he is currently sleeping more, needs to take a nap during the daytime and also takes a nap in the afternoon at times.  He goes to [facility] program, reports this may have been brought to his attention by his staff and peers at the program also.  Patient however denies any sadness.  He just lost his dad in May 2022, and he is coping with his grief well.  He did not seem to be preoccupied with any paranoid delusions, did not appear to be responding to any internal stimuli.  Patient denies any suicidality or homicidality or perceptual disturbances.  Patient denies side effects to medications.  Reports he is compliant.  As per sister patient is coping with his loss well.  He continues to have support from caretakers who comes into the house to help with household chores Monday through Friday.  Patient also goes to day program.  As per sister it is likely he may not be very compliant with his Allena Earing however she will keep an eye on that and will Pharmacist, hospital know.  She however has not noticed any worsening problems with his movement disorder-tardive dyskinesia.  Sister also concerned about excessive sleepiness during the day.  Patient continues to be in psychotherapy sessions with Ms.  Christina Hussami   Visit Diagnosis:    ICD-10-CM   1. Schizoaffective disorder, depressive type (HCC)  F25.1 traZODone (DESYREL) 50 MG tablet    2. Tardive dyskinesia  G24.01     3. Other specified anxiety disorders  F41.8    with limited symptom attacks    4. At risk for prolonged QT interval syndrome  Z91.89 EKG 12-Lead    5. High risk medication use  Z79.899 Prolactin    6. Hypersomnia  G47.10     7. Bereavement  Z63.4       Past Psychiatric History: Reviewed past psychiatric history from progress note on 03/29/2018  Past Medical History:  Past Medical History:  Diagnosis Date   Allergy     Past Surgical History:  Procedure Laterality Date   ANKLE SURGERY     COLON SURGERY     HERNIA REPAIR     INTRAMEDULLARY (IM) NAIL INTERTROCHANTERIC Right 06/18/2019   Procedure: INTRAMEDULLARY (IM) NAIL INTERTROCHANTRIC;  Surgeon: Lyndle Herrlich, MD;  Location: [facility] ORS;  Service: Orthopedics;  Laterality: Right;   TONSILLECTOMY      Family Psychiatric History: Reviewed family psychiatric history from progress note on 03/29/2018  Family History:  Family History  Problem Relation Age of Onset   Diabetes Mother    Stroke Mother    Cancer Father        colon   Colon cancer Father    Colon cancer Other    Mental illness Neg Hx  Social History: Reviewed social history from progress note on 03/29/2018 Social History   Socioeconomic History   Marital status: Single    Spouse name: Not on file   Number of children: 0   Years of education: High School   Highest education level: High school graduate  Occupational History    Comment: disabled  Tobacco Use   Smoking status: Never   Smokeless tobacco: Never  Vaping Use   Vaping Use: Never used  Substance and Sexual Activity   Alcohol use: No   Drug use: No   Sexual activity: Not Currently  Other Topics Concern   Not on file  Social History Narrative   Lives with parents, mental handicap, uses walker   Social  Determinants of Health   Financial Resource Strain: Not on file  Food Insecurity: Not on file  Transportation Needs: Not on file  Physical Activity: Not on file  Stress: Not on file  Social Connections: Not on file    Allergies:  Allergies  Allergen Reactions   Prednisone Other (See Comments)    Did not like the way it made him feel  Did not like the way it made him feel    Lamotrigine Rash    Metabolic Disorder Labs: Lab Results  Component Value Date   HGBA1C 4.8 09/24/2019   MPG 91 09/24/2019   MPG 91 10/05/2018   No results found for: PROLACTIN Lab Results  Component Value Date   CHOL 143 10/08/2020   TRIG 101 10/08/2020   HDL 46 10/08/2020   CHOLHDL 3.1 10/08/2020   LDLCALC 79 10/08/2020   LDLCALC 89 09/24/2019   Lab Results  Component Value Date   TSH 4.47 10/08/2020   TSH 4.92 (H) 04/02/2020    Therapeutic Level Labs: No results found for: LITHIUM No results found for: VALPROATE No components found for:  CBMZ  Current Medications: Current Outpatient Medications  Medication Sig Dispense Refill   allopurinol (ZYLOPRIM) 100 MG tablet TAKE 1 TABLET(100 MG) BY MOUTH DAILY 90 tablet 3   cholecalciferol (VITAMIN D) 1000 units tablet Take 1,000 Units by mouth daily.     citalopram (CELEXA) 40 MG tablet TAKE 1 TABLET(40 MG) BY MOUTH DAILY 90 tablet 2   fexofenadine (ALLEGRA) 180 MG tablet Take 180 mg by mouth.     INGREZZA 80 MG capsule TAKE 1 CAPSULE BY MOUTH AT BEDTIME 30 capsule 7   levothyroxine (SYNTHROID) 25 MCG tablet TAKE 1 TABLET BY MOUTH DAILY BEFORE BREAKFAST ON AN EMPTY STOMACH( NOT TO TAKE WITH OTHER MEDICINES) 90 tablet 3   propranolol (INDERAL) 10 MG tablet Take 1 tablet (10 mg total) by mouth 2 (two) times daily as needed. For severe anxiety/agitation 180 tablet 2   QUEtiapine (SEROQUEL) 25 MG tablet Take 1 tablet (25 mg total) by mouth at bedtime. 90 tablet 2   thiamine (VITAMIN B-1) 100 MG tablet Take 100 mg by mouth daily.     traZODone  (DESYREL) 50 MG tablet Take 1.5 tablets (75 mg total) by mouth at bedtime. 135 tablet 0   No current facility-administered medications for this visit.     Musculoskeletal: Strength & Muscle Tone: within normal limits Gait & Station:  unsteady , limping - baseline Patient leans: Front  Psychiatric Specialty Exam: Review of Systems  Unable to perform ROS: Psychiatric disorder   Blood pressure 117/73, pulse 64, temperature 98.1 F (36.7 C), temperature source Temporal, weight 144 lb 12.8 oz (65.7 kg).Body mass index is 24.47 kg/m.  General Appearance:  Casual  Eye Contact:  Fair  Speech:  Normal Rate  Volume:  Normal  Mood:   grieving - improving  Affect:  Appropriate  Thought Process:  Goal Directed and Descriptions of Associations: Circumstantial  Orientation:  Other:  person, date , situation  Thought Content: Logical   Suicidal Thoughts:  No  Homicidal Thoughts:  No  Memory:  Immediate;   Fair Recent;   Fair Remote;   limited  Judgement:  Fair  Insight:  Shallow  Psychomotor Activity:  TD  Concentration:  Concentration: Fair and Attention Span: Fair  Recall:  [employer] of Knowledge:  limited  Language: Fair  Akathisia:  No  Handed:  Right  AIMS (if indicated): done  Assets:  Desire for Improvement Housing Social Support Transportation  ADL's:  Intact  Cognition: baseline , has ID  Sleep:   Excessive   Screenings: AIMS    Flowsheet Row Office Visit from 08/04/2021 in [facility] Office Visit from 05/05/2021 in [facility] Office Visit from 01/11/2019 in [facility] Office Visit from 11/07/2018 in [facility] Office Visit from 09/07/2018 in [facility]  AIMS Total Score 4 4 5 8 8       GAD-7    Flowsheet Row Office Visit from 07/07/2018 in [facility]  Total GAD-7 Score 15      PHQ2-9    Flowsheet Row  Office Visit from 05/05/2021 in [facility] Counselor from 04/16/2021 in [facility] Counselor from 03/24/2021 in [facility] Office Visit from 12/04/2020 in [facility] Office Visit from 04/09/2020 in [place]  PHQ-2 Total Score 1 0 0 0 0  PHQ-9 Total Score 3 -- -- -- 0      Flowsheet Row Counselor from 05/22/2021 in [facility] Office Visit from 05/05/2021 in [facility] Counselor from 04/16/2021 in [facility]  C-SSRS RISK CATEGORY No Risk No Risk No Risk        Assessment and Plan: TREYLON HENARD is a 55 year old Caucasian male who has a history of schizoaffective disorder, diabetes, hypothyroidism, TD, was evaluated in office today.  Patient with recent loss of his dad, hypersomnia unknown if this is due to medication side effects, will benefit from the following plan.  Plan Schizoaffective disorder-stable Seroquel 25 mg p.o. nightly Celexa 40 mg p.o. daily Propranolol 10 mg p.o. daily as needed for severe anxiety/agitation  TD-stable Ingrezza 80 mg p.o. daily Encouraged compliance  Other specified anxiety disorder-stable Continue CBT with Ms. Christina Hussami Continue Celexa 40 mg p.o. daily  Hypersomnia-unstable Reduce trazodone to 75 mg p.o. nightly. We will monitor closely Discussed sleep hygiene techniques  Bereavement-improving We will monitor closely, patient to continue counseling with therapist.  High risk medication use-will order prolactin level, he will also need hemoglobin A1c, lipid panel, if not already done  At risk for prolonged QT syndrome-we will order EKG.  Provided the phone [phone] to contact to schedule an appointment.  Collateral information obtained from sister who was present in session as noted above.  Encourage compliance with  medication.  Sister to monitor closely if patient is taking his medications properly.  Follow-up in clinic in 3 months in office.  This note was generated in part or whole with voice recognition software. Voice recognition is usually quite accurate but there are transcription errors that can and very often do  occur. I apologize for any typographical errors that were not detected and corrected.       Jomarie LongsSaramma Icey Tello, MD 08/05/2021, 11:24 AM

## 2021-08-06 ENCOUNTER — Ambulatory Visit (INDEPENDENT_AMBULATORY_CARE_PROVIDER_SITE_OTHER): Payer: Medicare Other | Admitting: Licensed Clinical Social Worker

## 2021-08-06 ENCOUNTER — Other Ambulatory Visit: Payer: Self-pay

## 2021-08-06 DIAGNOSIS — F251 Schizoaffective disorder, depressive type: Secondary | ICD-10-CM | POA: Diagnosis not present

## 2021-08-06 NOTE — Progress Notes (Signed)
Virtual Visit via Video Note  I connected with Andres [name] on 08/06/21 at  1:00 PM EDT by a video enabled telemedicine application and verified that I am speaking with the correct person using two identifiers.  Location: Patient: home Provider: remote office [place], [place])   I discussed the limitations of evaluation and management by telemedicine and the availability of in person appointments. The patient expressed understanding and agreed to proceed.  I discussed the assessment and treatment plan with the patient. The patient was provided an opportunity to ask questions and all were answered. The patient agreed with the plan and demonstrated an understanding of the instructions.   The patient was advised to call back or seek an in-person evaluation if the symptoms worsen or if the condition fails to improve as anticipated.  I provided 60 minutes of non-face-to-face time during this encounter.   Andres Barbato R Magdaline Zollars, LCSW   THERAPIST PROGRESS NOTE  Session Time: 1-2p  Participation Level: Active  Behavioral Response: NeatAlertAnxious  Type of Therapy: Individual Therapy  Treatment Goals addressed: Anxiety and Coping  Interventions: Solution Focused  Summary: Andres [name] is a 55 y.o. male who presents with improving symptoms related to schizoaffective disorder diagnosis. Patient is accompanied by his sister at initiation of session. Patient sister reports that she has some concerns that berries not being compliant with his medication because she is finding full bottles of medication with no explanation as to why the pills have not been taken. Andres [name] reports that he is the one who takes medicines out of the bottles and into his weekly organizer. Andres [name] reports that he is managing his medications and that he is taking them on a daily basis. Patient sister reports that she also has concerns that patient is not taking care of his personal hygiene as well as he should, especially on  hot days. Discussed with patient importance of cleanliness and personal hygiene. Patient reports that he tries to take a shower and brush his teeth on a daily basis. Discussed [facility] going [facility], and the continued benefits of this. Patient explored how helpful his caregivers are in preparing his food for him and helping him with other house chores. Patient reports that he has the ability to take care of his own medicines, prepare his own foods, and take care of his personal hygiene. Patient appeared to have a pronounced religious preoccupation focus to his conversation at session today.Continued recommendations are as follows: self care behaviors, positive social engagements, focusing on overall work/home/life balance, and focusing on positive physical and emotional wellness.  .   Suicidal/Homicidal: No  Therapist Response: Andres [name] is continuing to describe in detail current and past experiences with specific fears, prominent worries, and anxiety symptoms including their impact on functioning and how Andres [name] is managing symptoms in the moment. Treatment to continue.  [name] is working on several social skills and incorporating them into his daily schedule. Treatment to continue as indicated.   Plan: Return again in 4 weeks.  Diagnosis: Axis I: Schizoaffective Disorder    Axis II: No diagnosis    Andres Haber Amilah Greenspan, LCSW 08/06/2021

## 2021-08-27 ENCOUNTER — Other Ambulatory Visit: Payer: Self-pay

## 2021-08-27 DIAGNOSIS — R7309 Other abnormal glucose: Secondary | ICD-10-CM

## 2021-08-27 DIAGNOSIS — Z0000 Encounter for general adult medical examination without abnormal findings: Secondary | ICD-10-CM

## 2021-08-27 DIAGNOSIS — E039 Hypothyroidism, unspecified: Secondary | ICD-10-CM

## 2021-08-27 DIAGNOSIS — I10 Essential (primary) hypertension: Secondary | ICD-10-CM

## 2021-08-27 DIAGNOSIS — Z125 Encounter for screening for malignant neoplasm of prostate: Secondary | ICD-10-CM

## 2021-08-27 DIAGNOSIS — E559 Vitamin D deficiency, unspecified: Secondary | ICD-10-CM

## 2021-09-08 ENCOUNTER — Ambulatory Visit (INDEPENDENT_AMBULATORY_CARE_PROVIDER_SITE_OTHER): Payer: Medicare Other | Admitting: Licensed Clinical Social Worker

## 2021-09-08 ENCOUNTER — Other Ambulatory Visit: Payer: Self-pay

## 2021-09-08 DIAGNOSIS — F251 Schizoaffective disorder, depressive type: Secondary | ICD-10-CM

## 2021-09-08 NOTE — Plan of Care (Signed)
  Problem: Thought Disorders CCP Problem  1 Achieve controlled behavior, moderated mood, more deliberative speech and thought process, and a stable daily activity pattern.   Goal: LTG: Sustain symptom remission and continue the gains made during the acute phase of treatment:  Input needed on appropriate metric Outcome: Not Progressing Goal: LTG: Increase coping skills to promote long-term recovery and improve ability to perform daily activities Outcome: Not Progressing Goal: STG: Develop a Wellness Recovery Action Plan (WRAP) Outcome: Progressing Goal: STG: Promote adherence to treatment regimen Outcome: Not Progressing Goal: STG: @PREFFIRSTNAME @ will practice behavioral activation skills 3x per week for the next 4 weeks Outcome: Progressing Intervention: Work with @PREFFIRSTNAME @ to discuss risks and benefits of medication treatment options for this problem and prescribe as indicated Intervention: Work with @PREFFIRSTNAME @ to develop a list of pros and cons of taking medication

## 2021-09-08 NOTE — Plan of Care (Signed)
Developed plan 

## 2021-09-08 NOTE — Progress Notes (Signed)
Virtual Visit via Video Note  I connected with Andres [name] on 09/08/21 at 11:00 AM EDT by a video enabled telemedicine application and verified that I am speaking with the correct person using two identifiers.  Location: Patient: home Provider: remote office [place], [place])    I discussed the limitations of evaluation and management by telemedicine and the availability of in person appointments. The patient expressed understanding and agreed to proceed.  I discussed the assessment and treatment plan with the patient. The patient was provided an opportunity to ask questions and all were answered. The patient agreed with the plan and demonstrated an understanding of the instructions.   The patient was advised to call back or seek an in-person evaluation if the symptoms worsen or if the condition fails to improve as anticipated.  I provided 60 minutes of non-face-to-face time during this encounter.   Rhandi Despain R Helen Winterhalter, LCSW   THERAPIST PROGRESS NOTE  Session Time: 11a-12p  Participation Level: Did Not Attend--family session with pt sister, Andres [name].  Behavioral Response: NA  Type of Therapy: Family Therapy  Treatment Goals addressed: Coping and Diagnosis: schizoaffective disorder/bipolar  Interventions: Person Centered and Solution Focused Strategies  Summary: Andres [name] is a 55 y.o. male who presents with symptoms consistent with schizoaffective disorder.   Session conducted with pts sister, Andres [name].  Discussed current symptoms of concern: medication noncompliance, poor personal hygiene, obsessional preoccupation with different topics (games--cards, checkerboards, money, religion-based beliefs), pt seeking financial independence and getting mad that he has a payee, decreased appetite/weight loss, poor sleep quality.   Allowed pts sister safe space to explore her concerns and discussed interventions to help pt stabilize. Discussed possible need for external  services. Offered pts sister unconditional support.   Pt has appt tomorrow for individual session..   Suicidal/Homicidal: No  Therapist Response: Developed treatment plan and had family session with sister to get better background information about current symptoms of concerns. Provided sister with psychoeducational information about pts disorder and thought processes.   Plan: Return again in 1 day. Scheduled tomorrow for individual session w/ pt.  Diagnosis: Axis I: Schizoaffective Disorder    Axis II: No diagnosis    [name] Kimberla Driskill, LCSW 09/08/2021

## 2021-09-09 ENCOUNTER — Ambulatory Visit (INDEPENDENT_AMBULATORY_CARE_PROVIDER_SITE_OTHER): Payer: Medicare Other | Admitting: Licensed Clinical Social Worker

## 2021-09-09 DIAGNOSIS — F251 Schizoaffective disorder, depressive type: Secondary | ICD-10-CM

## 2021-09-09 NOTE — Progress Notes (Signed)
Virtual Visit via Video Note  I connected with Andres [name] on 09/09/21 at  1:00 PM EDT by a video enabled telemedicine application and verified that I am speaking with the correct person using two identifiers.  Location: Patient: home Provider: [facility]   I discussed the limitations of evaluation and management by telemedicine and the availability of in person appointments. The patient expressed understanding and agreed to proceed.   I discussed the assessment and treatment plan with the patient. The patient was provided an opportunity to ask questions and all were answered. The patient agreed with the plan and demonstrated an understanding of the instructions.   The patient was advised to call back or seek an in-person evaluation if the symptoms worsen or if the condition fails to improve as anticipated.  I provided 45 minutes of non-face-to-face time during this encounter.   Andres Siple R Kenosha Doster, LCSW   THERAPIST PROGRESS NOTE  Session Time: 1-1:45p  Participation Level: None  Behavioral Response: NeatAlertAnxious  Type of Therapy: Individual Therapy  Treatment Goals addressed: Coping and Diagnosis: depression  Interventions: CBT and Social Skills Training  Summary: Andres [name] is a 55 y.o. male who presents with continuing symptoms related to schizoaffective disorder. Patient reports that he feels that his mood fluctuates at times. Patient states that he is aware that other people tell him whenever his mood has changed. Patient states that he has been compliant with medication, but sister is aware that patient has not been compliant with medicine. patient reports inconsistent quality and quantity of sleep. Reviewed sleep hygiene strategies. Patient reports that his appetite is within normal limits.  Allowed patients a space to explore and express thoughts and feelings associated with the recent external stressors in life events. Patient explored when he has had "nervous  breakdowns" in the past, leading to psychiatric hospitalizations. Patient seems to be aware of what behaviors lead him to a nervous breakdown, but he is unclear of why this is happening.  Explored patient's relationship with his mother and father, and allowed patient to explore life events that were happening around the time of his mother death and his father's death. Help patient identify thoughts and feelings associated with this time in life.  Patient sister reported concerns about patients personal hygiene--helped patient identify personal hygiene behaviors, and ways that patient can improve. .  Continued recommendations are as follows: self care behaviors, positive social engagements, focusing on overall work/home/life balance, and focusing on positive physical and emotional wellness.    Suicidal/Homicidal: No  Therapist Response: Andres [name] is currently experiencing an increase in symptoms, which is reflective of fluctuating/intermittent progress. Treatment to continue as indicated  Plan: Return again in 4 weeks.  Diagnosis: Axis I: Schizoaffective disorder    Axis II: No diagnosis    Andres Haber [name], LCSW 09/09/2021

## 2021-09-10 NOTE — Plan of Care (Signed)
developed 

## 2021-09-10 NOTE — Plan of Care (Signed)
  Problem: Thought Disorders CCP Problem  1 Achieve controlled behavior, moderated mood, more deliberative speech and thought process, and a stable daily activity pattern.   Goal: LTG: Increase coping skills to promote long-term recovery and improve ability to perform daily activities Outcome: Progressing Goal: STG: @PREFFIRSTNAME @ will practice behavioral activation skills 3x per week for the next 4 weeks Outcome: Progressing

## 2021-10-08 ENCOUNTER — Ambulatory Visit (INDEPENDENT_AMBULATORY_CARE_PROVIDER_SITE_OTHER): Payer: Medicare Other | Admitting: Licensed Clinical Social Worker

## 2021-10-08 ENCOUNTER — Other Ambulatory Visit: Payer: Self-pay

## 2021-10-08 DIAGNOSIS — F251 Schizoaffective disorder, depressive type: Secondary | ICD-10-CM | POA: Diagnosis not present

## 2021-10-08 DIAGNOSIS — F70 Mild intellectual disabilities: Secondary | ICD-10-CM | POA: Diagnosis not present

## 2021-10-08 NOTE — Progress Notes (Addendum)
Virtual Visit via Video Note  I connected with Andres [name] on 10/08/21 at  1:00 PM EDT by a video enabled telemedicine application and verified that I am speaking with the correct person using two identifiers.  Location: Patient: home/sister's office  Provider: [facility]   I discussed the limitations of evaluation and management by telemedicine and the availability of in person appointments. The patient expressed understanding and agreed to proceed.   I discussed the assessment and treatment plan with the patient. The patient was provided an opportunity to ask questions and all were answered. The patient agreed with the plan and demonstrated an understanding of the instructions.   The patient was advised to call back or seek an in-person evaluation if the symptoms worsen or if the condition fails to improve as anticipated.  I provided 60 minutes of non-face-to-face time during this encounter.   [name] R [name], LCSW  THERAPIST PROGRESS NOTE  Session Time: 1-2p  Participation Level: Active  Behavioral Response: Neat and Well GroomedAlertAngry, Anxious, and Irritable  Type of Therapy: Individual Therapy  Treatment Goals addressed:  Goal: LTG: Sustain symptom remission and continue the gains made during the acute phase of treatment:  Input needed on appropriate metric Outcome: Not Progressing Note: Increase in symptoms: anger, med noncompliance, oppositional behaviors in multiple contexts, religious preoccupation  Goal: LTG: Increase coping skills to promote long-term recovery and improve ability to perform daily activities Outcome: Not Progressing Note: Daily activities unable to perform: medication compliance, personal hygiene, preparing meals  Goal: STG: Promote adherence to treatment regimen Outcome: Not Progressing  Goal: STG: @PREFFIRSTNAME @ will practice behavioral activation skills 3x per week for the next 4 weeks Outcome: Progressing Note: Pt is going to [facility] groups and making effort to work on hygiene  Interventions:  Intervention: Assist with coping skills and behavior  Intervention: Implement behavioral expectations  Intervention: WORK WITH TO IDENTIFY AT LEAST 1 TRIGGER THOUGHTS (THOUGHTS THAT TRIGGER AN ANGER RESPONSE)  Intervention: [name] ON COPE AHEAD SKILLS TO COPE WITH ANGER AROUSAL  Intervention: Discuss self-management skills  Intervention: REVIEW PLEASE SKILLS (TREAT PHYSICAL ILLNESS, BALANCE EATING, AVOID MOOD-ALTERING SUBSTANCES, BALANCE SLEEP AND GET EXERCISE) WITH [name]  Intervention: Give positive reinforcement and praise  Summary: Andres [name] is a 55 y.o. male who presents with escalating symptoms related to schizoaffective disorder diagnosis. Pt is accompanied by his sister 58) throughout assessment, who is primary historian. Andres [name] is continuing to be noncompliant with medication--which means he is taking some of his medications some of the time. Mccabe continues to deny that he is not taking medication but his sister checks the pills in his container and they are consistently "off" as pills remain in the container.  Dontavious's sister was pulled the side recently and asked to discuss [name]'s behavior at the end of his [facility] session. The leader stated to [name]'s sister that [name] was continuing to talk about how upset he was that God didn't send him a woman to make love to. When [facility] leader told Keyston that he shouldn't be talking about this at group, Andres [name] got really agitated and noncooperative with her at that point. Pt is not violent or destructive, just oppositional by withdrawing from activities that others were engaged in. Pts sister is also concerned that the leader stated "I think Andres is headed towards a nervous breakdown".  Pts sister took this very seriously and is ready to make whatever moves need to be made to make sure Andres [name] is  stabilized.   Suicidal/Homicidal: No. Pt has stated  that he wishes God would take him home to heaven or that he wishes he would go to sleep and not wake up (per sister).  Pt denies SI or HI.   Therapist Response: [name] is currently experiencing an escalation in symptoms including: increase in irritability/agitation, verbalizing frustration to others, oppositional behaviors, poor sleep quality/quantity,  fixations/preoccupations: (religion, games, cards), medication noncompliance, AVH (snakes, voices), and racing thoughts. LCSW counselor consulted with psychiatrist [name] about higher levels of care and pt referred to local ED or [facility]. Pts sister has consulted with siblings and they feel [facility] would be a better fit because of distance/local areas. Supported family unconditionally and praised decision to seek higher level of care for Andres [name].  Thor presented as very agitated and was rocking back and forth throughout session and often would put hands over his face and bend down with his face in his lap, as if he were under distress. These behaviors are not baseline for [facility].  Plan: Return again in 4 weeks.  Diagnosis: Axis I: schizoaffective disorder    Axis II: No diagnosis    [name] Andres Twaddle, LCSW 10/08/2021

## 2021-10-08 NOTE — Plan of Care (Signed)
  Problem: Thought Disorders CCP Problem  1 Achieve controlled behavior, moderated mood, more deliberative speech and thought process, and a stable daily activity pattern.   Goal: LTG: Sustain symptom remission and continue the gains made during the acute phase of treatment:  Input needed on appropriate metric Outcome: Not Progressing Note: Increase in symptoms: anger, med noncompliance, oppositional behaviors in multiple contexts, religious preoccupation Goal: LTG: Increase coping skills to promote long-term recovery and improve ability to perform daily activities Outcome: Not Progressing Note: Daily activities unable to perform: medication compliance, personal hygiene, preparing meals Goal: STG: Promote adherence to treatment regimen Outcome: Not Progressing Goal: STG: @PREFFIRSTNAME @ will practice behavioral activation skills 3x per week for the next 4 weeks Outcome: Progressing Note: Pt is going to [facility] groups and making effort to work on hygiene Intervention: Assist with coping skills and behavior Intervention: Implement behavioral expectations Intervention: WORK WITH TO IDENTIFY AT LEAST 1 TRIGGER THOUGHTS (THOUGHTS THAT TRIGGER AN ANGER RESPONSE) Intervention: [name] ON COPE AHEAD SKILLS TO COPE WITH ANGER AROUSAL Intervention: Discuss self-management skills Intervention: REVIEW PLEASE SKILLS (TREAT PHYSICAL ILLNESS, BALANCE EATING, AVOID MOOD-ALTERING SUBSTANCES, BALANCE SLEEP AND GET EXERCISE) WITH [name] Intervention: Give positive reinforcement and praise

## 2021-10-20 ENCOUNTER — Other Ambulatory Visit: Payer: Medicare Other

## 2021-10-27 ENCOUNTER — Other Ambulatory Visit: Payer: Self-pay

## 2021-10-27 ENCOUNTER — Ambulatory Visit (INDEPENDENT_AMBULATORY_CARE_PROVIDER_SITE_OTHER): Payer: Medicare Other | Admitting: Family Medicine

## 2021-10-27 ENCOUNTER — Encounter: Payer: Self-pay | Admitting: Family Medicine

## 2021-10-27 VITALS — BP 113/48 | HR 51 | Ht 64.5 in | Wt 141.8 lb

## 2021-10-27 DIAGNOSIS — Z8781 Personal history of (healed) traumatic fracture: Secondary | ICD-10-CM | POA: Diagnosis not present

## 2021-10-27 DIAGNOSIS — Z23 Encounter for immunization: Secondary | ICD-10-CM

## 2021-10-27 DIAGNOSIS — E039 Hypothyroidism, unspecified: Secondary | ICD-10-CM

## 2021-10-27 DIAGNOSIS — F251 Schizoaffective disorder, depressive type: Secondary | ICD-10-CM | POA: Diagnosis not present

## 2021-10-27 DIAGNOSIS — G2119 Other drug induced secondary parkinsonism: Secondary | ICD-10-CM

## 2021-10-27 DIAGNOSIS — F418 Other specified anxiety disorders: Secondary | ICD-10-CM

## 2021-10-27 DIAGNOSIS — F70 Mild intellectual disabilities: Secondary | ICD-10-CM

## 2021-10-27 DIAGNOSIS — I10 Essential (primary) hypertension: Secondary | ICD-10-CM | POA: Diagnosis not present

## 2021-10-27 DIAGNOSIS — R29898 Other symptoms and signs involving the musculoskeletal system: Secondary | ICD-10-CM

## 2021-10-27 DIAGNOSIS — M1A9XX1 Chronic gout, unspecified, with tophus (tophi): Secondary | ICD-10-CM

## 2021-10-27 NOTE — Assessment & Plan Note (Signed)
Controlled HTN off medication currently Not checking BP regularly    Plan:  1. On Propranolol 2. Encourage improved lifestyle - low sodium diet, regular exercise 3. May try to monitor BP outside office, bring readings to next visit, if persistently >140/90 or new symptoms notify office sooner

## 2021-10-27 NOTE — Patient Instructions (Addendum)
Thank you for coming to the office today.  Will complete FL2 by end of day Thursday or first thing Friday AM   Please schedule a Follow-up Appointment to: Return if symptoms worsen or fail to improve.  If you have any other questions or concerns, please feel free to call the office or send a message through MyChart. You may also schedule an earlier appointment if necessary.  Additionally, you may be receiving a survey about your experience at our office within a few days to 1 week by e-mail or mail. We value your feedback.  Saralyn Pilar, DO [facility], [place]

## 2021-10-27 NOTE — Assessment & Plan Note (Signed)
Stable, controlled now normalized on levothyroxine low dose  Plan Continue current Levothyroxine daily before breakfast

## 2021-10-27 NOTE — Progress Notes (Signed)
Subjective:    Patient ID: ERNESTO ZUKOWSKI, male    DOB: Oct 02, 1966, 55 y.o.   MRN: [id number]  ELIH MOONEY is a 55 y.o. male presenting on 10/27/2021 for Schizophrenia   HPI  Here with sister, Tonette Bihari.  They are looking into Assisted Living Facilities, such as [facility] Here for FL2 form today  Gout, chronic No recent flares, has done well. He had been on Allopurinol for a while at higher dose. Uric acid 5.5 (09/2020) slight increase after his allopurinol dose was lowered but doing very well He continues Allopurinol 100mg  daily, tolerating well. No recent gout flares   Schizoaffective Disorder / Drug Induced Parkinsons Followed by Psychiatry Managed on current medications.  On citalopram 40mg  daily On Trazodone 75mg  nightly at bedtime 50mg  x 1.5 = 75mg  On Ingrezza 80mg  daily Improved on Propranolol 10mg  BID regularly for anxiety    DIFIFICULTY WALKING / GAIT PROBLEM / BALANCE ISSUE LEFT HIP  He had visit to go to [facility] for routine consultation on 08/07/20. Since that date and when he left and the next day he has had difficulty walking.    Loss of Balance / History of R hip fracture s/p surgery (June 2020 - Dr at [facility]) History of skilled nursing, prior broken R hip, prior broken ankles. He had used walker before  Occasionally may have some knee pain at times if laying in bed especially.   He has no longer used walker for ambulation. He is able to ambulate on his own.  HM Due for Flu Shot, will receive today    Depression screen [facility] 2/9 10/27/2021 12/04/2020 04/09/2020  Decreased Interest 0 0 0  Down, Depressed, Hopeless 0 0 0  PHQ - 2 Score 0 0 0  Altered sleeping 0 - 0  Tired, decreased energy 0 - 0  Change in appetite 0 - 0  Feeling bad or failure about yourself  0 - 0  Trouble concentrating 0 - 0  Moving slowly or fidgety/restless 0 - 0  Suicidal thoughts 0 - 0  PHQ-9 Score 0 - 0  Difficult doing work/chores Not difficult at all - Not  difficult at all  Some encounter information is confidential and restricted. Go to Review Flowsheets activity to see all data.    Social History   Tobacco Use   Smoking status: Never   Smokeless tobacco: Never  Vaping Use   Vaping Use: Never used  Substance Use Topics   Alcohol use: No   Drug use: No    Review of Systems Per HPI unless specifically indicated above     Objective:    BP (!) 113/48 (BP Location: Left Arm, Patient Position: Sitting, Cuff Size: Normal)   Pulse (!) 51   Ht 5' 4.5" (1.638 m)   Wt 141 lb 12.8 oz (64.3 kg)   SpO2 100%   BMI 23.96 kg/m   Wt Readings from Last 3 Encounters:  10/27/21 141 lb 12.8 oz (64.3 kg)  04/27/21 149 lb 12.8 oz (67.9 kg)  01/28/21 153 lb (69.4 kg)    Physical Exam Vitals and nursing note reviewed.  Constitutional:      General: He is not in acute distress.    Appearance: He is well-developed. He is not diaphoretic.     Comments: Well-appearing, comfortable, cooperative  HENT:     Head: Normocephalic and atraumatic.  Eyes:     General:        Right eye: No discharge.  Left eye: No discharge.     Conjunctiva/sclera: Conjunctivae normal.     Pupils: Pupils are equal, round, and reactive to light.  Neck:     Thyroid: No thyromegaly.  Cardiovascular:     Rate and Rhythm: Normal rate and regular rhythm.     Pulses: Normal pulses.     Heart sounds: Normal heart sounds. No murmur heard. Pulmonary:     Effort: Pulmonary effort is normal. No respiratory distress.     Breath sounds: Normal breath sounds. No wheezing or rales.  Abdominal:     General: Bowel sounds are normal. There is no distension.     Palpations: Abdomen is soft. There is no mass.     Tenderness: There is no abdominal tenderness.  Musculoskeletal:        General: No tenderness. Normal range of motion.     Cervical back: Normal range of motion and neck supple.     Comments: Upper / Lower Extremities: - Normal muscle tone, strength bilateral upper  extremities 5/5, lower extremities 5/5  Lymphadenopathy:     Cervical: No cervical adenopathy.  Skin:    General: Skin is warm and dry.     Findings: No erythema or rash.  Neurological:     Mental Status: He is alert and oriented to person, place, and time.     Comments: Distal sensation intact to light touch all extremities  Psychiatric:        Mood and Affect: Mood normal.        Behavior: Behavior normal.        Thought Content: Thought content normal.     Comments:  Well groomed, good eye contact. Increased frequency of speech with normal thoughts, occasional interrupting and some pressured speech. Appears to be at baseline with some cognitive delay    Results for orders placed or performed in visit on 10/07/20  VITAMIN D 25 Hydroxy (Vit-D Deficiency, Fractures)  Result Value Ref Range   Vit D, 25-Hydroxy 36 30 - 100 ng/mL  TSH  Result Value Ref Range   TSH 4.47 0.40 - 4.50 mIU/L  T4, free  Result Value Ref Range   Free T4 1.4 0.8 - 1.8 ng/dL  Uric acid  Result Value Ref Range   Uric Acid, Serum 5.5 4.0 - 8.0 mg/dL  PSA  Result Value Ref Range   PSA 0.63 < OR = 4.0 ng/mL  Lipid panel  Result Value Ref Range   Cholesterol 143 <200 mg/dL   HDL 46 > OR = 40 mg/dL   Triglycerides 277 <824 mg/dL   LDL Cholesterol (Calc) 79 mg/dL (calc)   Total CHOL/HDL Ratio 3.1 <5.0 (calc)   Non-HDL Cholesterol (Calc) 97 <235 mg/dL (calc)  COMPLETE METABOLIC PANEL WITH GFR  Result Value Ref Range   Glucose, Bld 77 65 - 99 mg/dL   BUN 18 7 - 25 mg/dL   Creat 3.61 4.43 - 1.54 mg/dL   GFR, Est Non African American 81 > OR = 60 mL/min/1.16m2   GFR, Est African American 94 > OR = 60 mL/min/1.44m2   BUN/Creatinine Ratio NOT APPLICABLE 6 - 22 (calc)   Sodium 145 135 - 146 mmol/L   Potassium 3.7 3.5 - 5.3 mmol/L   Chloride 109 98 - 110 mmol/L   CO2 26 20 - 32 mmol/L   Calcium 9.3 8.6 - 10.3 mg/dL   Total Protein 6.1 6.1 - 8.1 g/dL   Albumin 4.2 3.6 - 5.1 g/dL   Globulin 1.9 1.9 -  3.7  g/dL (calc)   AG Ratio 2.2 1.0 - 2.5 (calc)   Total Bilirubin 0.7 0.2 - 1.2 mg/dL   Alkaline phosphatase (APISO) 72 35 - 144 U/L   AST 13 10 - 35 U/L   ALT 13 9 - 46 U/L  CBC with Differential/Platelet  Result Value Ref Range   WBC 4.6 3.8 - 10.8 Thousand/uL   RBC 5.08 4.20 - 5.80 Million/uL   Hemoglobin 15.8 13.2 - 17.1 g/dL   HCT 00.9 38.1 - 82.9 %   MCV 92.5 80.0 - 100.0 fL   MCH 31.1 27.0 - 33.0 pg   MCHC 33.6 32.0 - 36.0 g/dL   RDW 93.7 16.9 - 67.8 %   Platelets 122 (L) 140 - 400 Thousand/uL   MPV 11.7 7.5 - 12.5 fL   Neutro Abs 2,594 1,500 - 7,800 cells/uL   Lymphs Abs 1,348 850 - 3,900 cells/uL   Absolute Monocytes 414 200 - 950 cells/uL   Eosinophils Absolute 212 15 - 500 cells/uL   Basophils Absolute 32 0 - 200 cells/uL   Neutrophils Relative % 56.4 %   Total Lymphocyte 29.3 %   Monocytes Relative 9.0 %   Eosinophils Relative 4.6 %   Basophils Relative 0.7 %   Smear Review        Assessment & Plan:   Problem List Items Addressed This Visit     Schizoaffective disorder, depressive type (HCC) - Primary    Stable chronic problem Managed by Psychiatry On med management      Relevant Medications   propranolol (INDERAL) 10 MG tablet   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 On Propranolol 2. Encourage improved lifestyle - low sodium diet, regular exercise 3. May try to monitor BP outside office, bring readings to next visit, if persistently >140/90 or new symptoms notify office sooner      Relevant Medications   propranolol (INDERAL) 10 MG tablet   Drug-induced  parkinsonism (HCC)   Chronic tophaceous gout    Well controlled on prophylaxis Uric acid 5.5 on 100mg  Prior dose 300mg  allopurinol  Plan Continue current Allopurinol 100mg  daily for prevention      Relevant Medications   ibuprofen (ADVIL) 200 MG tablet   Anxiety disorder   Other Visit Diagnoses     S/P right hip fracture       Left leg weakness       Misalignment of left hip       Needs flu shot       Relevant Orders   Flu Vaccine QUAD 23mo+IM (Fluarix, Fluzone & Alfiuria Quad PF) (Completed)       No orders of the defined types were placed in this encounter.  Completed FL2 Form today with updated diagnoses and medication list up to date.    Follow up plan: Return if symptoms worsen or fail to improve.   , DO [facility] 10/27/2021, 9:55 AM

## 2021-10-27 NOTE — Assessment & Plan Note (Signed)
Stable chronic problem Managed by Psychiatry On med management

## 2021-10-27 NOTE — Assessment & Plan Note (Signed)
Chronic congenital problem Developmental delay with intellectual disability

## 2021-10-27 NOTE — Assessment & Plan Note (Signed)
Well controlled on prophylaxis Uric acid 5.5 on 100mg  Prior dose 300mg  allopurinol  Plan Continue current Allopurinol 100mg  daily for prevention

## 2021-11-03 ENCOUNTER — Other Ambulatory Visit: Payer: Self-pay | Admitting: Psychiatry

## 2021-11-03 ENCOUNTER — Encounter: Payer: Self-pay | Admitting: Psychiatry

## 2021-11-03 ENCOUNTER — Other Ambulatory Visit: Payer: Self-pay

## 2021-11-03 ENCOUNTER — Ambulatory Visit (INDEPENDENT_AMBULATORY_CARE_PROVIDER_SITE_OTHER): Payer: Medicare Other | Admitting: Psychiatry

## 2021-11-03 VITALS — BP 114/75 | HR 54 | Temp 98.5°F | Wt 140.4 lb

## 2021-11-03 DIAGNOSIS — G2401 Drug induced subacute dyskinesia: Secondary | ICD-10-CM | POA: Diagnosis not present

## 2021-11-03 DIAGNOSIS — F251 Schizoaffective disorder, depressive type: Secondary | ICD-10-CM | POA: Diagnosis not present

## 2021-11-03 DIAGNOSIS — F418 Other specified anxiety disorders: Secondary | ICD-10-CM | POA: Diagnosis not present

## 2021-11-03 DIAGNOSIS — F70 Mild intellectual disabilities: Secondary | ICD-10-CM | POA: Diagnosis not present

## 2021-11-03 NOTE — Progress Notes (Signed)
BH MD OP Progress Note  11/03/2021 12:47 PM Andres [name]  MRN:  [id number]  Chief Complaint:  Chief Complaint   Follow-up; Anxiety; Depression    HPI: Andres [name] is a 55 year old Caucasian male who has a history of schizoaffective disorder hypothyroidism, mild intellectual disability, tardive dyskinesia , single, lives in [place] was evaluated in office today.  Patient being a limited historian and collateral information obtained from sister Lupita Leash who presented with patient.  As per sister patient currently having trouble living alone, keeping up with his medications, hygiene.  Although he does not believe this as a problem ,family concerned.  Hence trying to get him placed at a group home, they have been talking to [facility] in [place].  A visit to [facility] scheduled for this Friday.  Sister is planning to talk to patient regarding this soon, she is worried about how he is going to react.  Patient today appeared to be calm, cooperative.  Denies any significant mood lability.  Patient reports he is compliant on medications.  Reports appetite is overall fair.  He does have caretakers coming into his home Monday through Saturday.  His sister who is the payee manages his finances.  Patient not very happy about the fact that he has no control over his finances.  Patient denies any suicidality, homicidality or perceptual disturbances.  Patient denies any other concerns today.  Visit Diagnosis:    ICD-10-CM   1. Schizoaffective disorder, depressive type (HCC)  F25.1     2. Tardive dyskinesia  G24.01     3. Other specified anxiety disorders  F41.8    limited symptom attacks    4. Mild intellectual disability  F70       Past Psychiatric History: Reviewed past psychiatric history from progress note on 03/29/2018.  Past Medical History:  Past Medical History:  Diagnosis Date   Allergy     Past Surgical History:  Procedure Laterality Date   ANKLE SURGERY     COLON SURGERY      HERNIA REPAIR     INTRAMEDULLARY (IM) NAIL INTERTROCHANTERIC Right 06/18/2019   Procedure: INTRAMEDULLARY (IM) NAIL INTERTROCHANTRIC;  Surgeon: Lyndle Herrlich, MD;  Location: [facility] ORS;  Service: Orthopedics;  Laterality: Right;   TONSILLECTOMY      Family Psychiatric History: Reviewed family psychiatric history from progress note on 03/29/2018.  Family History:  Family History  Problem Relation Age of Onset   Diabetes Mother    Stroke Mother    Cancer Father        colon   Colon cancer Father    Colon cancer Other    Mental illness Neg Hx     Social History: Reviewed social history from progress note on 03/29/2018. Social History   Socioeconomic History   Marital status: Single    Spouse name: Not on file   Number of children: 0   Years of education: High School   Highest education level: High school graduate  Occupational History    Comment: disabled  Tobacco Use   Smoking status: Never   Smokeless tobacco: Never  Vaping Use   Vaping Use: Never used  Substance and Sexual Activity   Alcohol use: No   Drug use: No   Sexual activity: Not Currently  Other Topics Concern   Not on file  Social History Narrative   Lives with parents, mental handicap, uses walker   Social Determinants of Health   Financial Resource Strain: Not on file  Food Insecurity: Not on file  Transportation Needs: Not on file  Physical Activity: Not on file  Stress: Not on file  Social Connections: Not on file    Allergies:  Allergies  Allergen Reactions   Prednisone Other (See Comments)    Did not like the way it made him feel  Did not like the way it made him feel    Lamotrigine Rash    Metabolic Disorder Labs: Lab Results  Component Value Date   HGBA1C 4.8 09/24/2019   MPG 91 09/24/2019   MPG 91 10/05/2018   No results found for: PROLACTIN Lab Results  Component Value Date   CHOL 143 10/08/2020   TRIG 101 10/08/2020   HDL 46 10/08/2020   CHOLHDL 3.1 10/08/2020   LDLCALC  79 10/08/2020   LDLCALC 89 09/24/2019   Lab Results  Component Value Date   TSH 4.47 10/08/2020   TSH 4.92 (H) 04/02/2020    Therapeutic Level Labs: No results found for: LITHIUM No results found for: VALPROATE No components found for:  CBMZ  Current Medications: Current Outpatient Medications  Medication Sig Dispense Refill   allopurinol (ZYLOPRIM) 100 MG tablet TAKE 1 TABLET(100 MG) BY MOUTH DAILY 90 tablet 3   cholecalciferol (VITAMIN D) 1000 units tablet Take 1,000 Units by mouth daily.     citalopram (CELEXA) 40 MG tablet TAKE 1 TABLET(40 MG) BY MOUTH DAILY 90 tablet 2   fexofenadine (ALLEGRA) 180 MG tablet Take 180 mg by mouth.     ibuprofen (ADVIL) 200 MG tablet Take 200 mg by mouth 2 (two) times daily with a meal.     INGREZZA 80 MG capsule TAKE 1 CAPSULE BY MOUTH AT BEDTIME 30 capsule 7   levothyroxine (SYNTHROID) 25 MCG tablet TAKE 1 TABLET BY MOUTH DAILY BEFORE BREAKFAST ON AN EMPTY STOMACH( NOT TO TAKE WITH OTHER MEDICINES) 90 tablet 3   propranolol (INDERAL) 10 MG tablet Take 1 tablet (10 mg total) by mouth 2 (two) times daily. For severe anxiety/agitation 180 tablet 2   QUEtiapine (SEROQUEL) 25 MG tablet Take 1 tablet (25 mg total) by mouth at bedtime. 90 tablet 2   thiamine (VITAMIN B-1) 100 MG tablet Take 100 mg by mouth daily.     traZODone (DESYREL) 50 MG tablet TAKE 1 AND 1/2 TABLETS(75 MG) BY MOUTH AT BEDTIME 135 tablet 0   No current facility-administered medications for this visit.     Musculoskeletal: Strength & Muscle Tone: within normal limits Gait & Station:  slow Patient leans: N/A  Psychiatric Specialty Exam: Review of Systems  Psychiatric/Behavioral:  Negative for agitation, behavioral problems, confusion, decreased concentration, dysphoric mood, hallucinations, self-injury, sleep disturbance and suicidal ideas. The patient is not nervous/anxious and is not hyperactive.   All other systems reviewed and are negative.  Blood pressure 114/75, pulse  (!) 54, temperature 98.5 F (36.9 C), temperature source Temporal, weight 140 lb 6.4 oz (63.7 kg).Body mass index is 23.73 kg/m.  General Appearance: Casual  Eye Contact:  Fair  Speech:  Normal Rate  Volume:  Normal  Mood:  Euthymic  Affect:  Congruent  Thought Process:  Goal Directed and Descriptions of Associations: Intact  Orientation:  Full (Time, Place, and Person)  Thought Content: Logical   Suicidal Thoughts:  No  Homicidal Thoughts:  No  Memory:  Immediate;   Fair Recent;   Fair Remote;   limited  Judgement:  Fair  Insight:  Fair  Psychomotor Activity:  Normal  Concentration:  Concentration: Fair and Attention  Span: Fair  Recall:  [employer] of Knowledge:  limited  Language: Fair  Akathisia:  No  Handed:  Right  AIMS (if indicated): done  Assets:  Communication Skills Desire for Improvement Housing Social Support  ADL's:  Intact  Cognition: baseline  Sleep:  Fair   Screenings: AIMS    Flowsheet Row Office Visit from 11/03/2021 in [facility] Office Visit from 08/04/2021 in [facility] Office Visit from 05/05/2021 in [facility] Office Visit from 01/11/2019 in [facility] Office Visit from 11/07/2018 in [facility]  AIMS Total Score 4 4 4 5 8       GAD-7    Flowsheet Row Office Visit from 07/07/2018 in [facility]  Total GAD-7 Score 15      PHQ2-9    Flowsheet Row Office Visit from 11/03/2021 in [facility] Office Visit from 10/27/2021 in [facility] Counselor from 08/06/2021 in [facility] Office Visit from 05/05/2021 in [facility] Counselor from 04/16/2021 in [facility]  PHQ-2 Total Score 2 0 1 1 0  PHQ-9 Total Score 9 0 -- 3 --      Flowsheet Row Office Visit from  11/03/2021 in [facility] Counselor from 10/08/2021 in [facility] Counselor from 08/06/2021 in [facility]  C-SSRS RISK CATEGORY No Risk Low Risk No Risk        Assessment and Plan: Andres [name] is a 55 year old Caucasian male who has a history of schizoaffective disorder, diabetes, hypothyroidism, TD, was evaluated in office today.  Patient is stable on current medication regimen.  Plan as noted below.  Plan Schizoaffective disorder-stable Seroquel 25 mg p.o. nightly Celexa 40 mg p.o. daily Propranolol 10 mg p.o. daily as needed for severe anxiety/agitation Trazodone 75 mg p.o. nightly-reduced dosage.  TD-stable Ingrezza 80 mg p.o. daily Encouraged compliance  Other specified anxiety disorder-stable Continue CBT with Ms. Christina Hussami Celexa 40 mg p.o. daily  Mild intellectual disability-chronic We will monitor closely.  Patient is currently in a day program.  Collateral information obtained from sister-as noted above. Patient has been noncompliant with labs and EKG, currently awaiting placement, sister reports since he will be transitioning to a primary provider and also a psychiatrist at the group home, they are waiting for that to happen prior to getting labs and EKG done.  Follow-up in clinic in 2 months or sooner as needed.  This note was generated in part or whole with voice recognition software. Voice recognition is usually quite accurate but there are transcription errors that can and very often do occur. I apologize for any typographical errors that were not detected and corrected.        56, MD 11/03/2021, 12:47 PM

## 2021-11-11 ENCOUNTER — Ambulatory Visit (INDEPENDENT_AMBULATORY_CARE_PROVIDER_SITE_OTHER): Payer: Medicare Other

## 2021-11-11 ENCOUNTER — Other Ambulatory Visit: Payer: Self-pay

## 2021-11-11 DIAGNOSIS — Z111 Encounter for screening for respiratory tuberculosis: Secondary | ICD-10-CM | POA: Diagnosis not present

## 2021-11-11 DIAGNOSIS — Z1152 Encounter for screening for COVID-19: Secondary | ICD-10-CM

## 2021-11-13 LAB — TB SKIN TEST
Induration: 0 mm
TB Skin Test: NEGATIVE

## 2021-11-13 LAB — NOVEL CORONAVIRUS, NAA: SARS-CoV-2, NAA: NOT DETECTED

## 2021-11-13 LAB — SARS-COV-2, NAA 2 DAY TAT

## 2021-11-13 NOTE — Progress Notes (Signed)
Ok

## 2021-11-25 ENCOUNTER — Telehealth: Payer: Self-pay | Admitting: Licensed Clinical Social Worker

## 2021-11-25 NOTE — Telephone Encounter (Signed)
Tonette Bihari, Sister/ guardian, requesting return call to discuss pt moving and other information before appointment tomorrow. [name]'s number [phone]

## 2021-11-26 ENCOUNTER — Ambulatory Visit (INDEPENDENT_AMBULATORY_CARE_PROVIDER_SITE_OTHER): Payer: Medicare Other | Admitting: Licensed Clinical Social Worker

## 2021-11-26 ENCOUNTER — Other Ambulatory Visit: Payer: Self-pay

## 2021-11-26 DIAGNOSIS — F251 Schizoaffective disorder, depressive type: Secondary | ICD-10-CM | POA: Diagnosis not present

## 2021-11-26 NOTE — Plan of Care (Signed)
  Problem: Thought Disorders CCP Problem  1 Achieve controlled behavior, moderated mood, more deliberative speech and thought process, and a stable daily activity pattern.   Goal: LTG: Sustain symptom remission and continue the gains made during the acute phase of treatment:  Input needed on appropriate metric Outcome: Progressing Note: Pt progressing significantly since last session Goal: LTG: Increase coping skills to promote long-term recovery and improve ability to perform daily activities Outcome: Progressing Note: Pt now in group home--able to perform daily activities better Goal: STG: Promote adherence to treatment regimen Outcome: Progressing Note: Pt more compliant with self care activities and medication management of symptoms  Goal: STG: @PREFFIRSTNAME @ will practice behavioral activation skills 3x per week for the next 4 weeks Outcome: Progressing Note: Pt performing activities in group home Intervention: Assess emotional status and coping mechanisms Intervention: Assist patient to identify own strengths and abilities Intervention: Identify effective coping behavior Intervention: Assess for impulsive behaviors

## 2021-11-26 NOTE — Progress Notes (Signed)
Virtual Visit via Audio Note  I connected with Andres [name] on 11/26/21 at  1:00 PM EST by an audio enabled telemedicine application and verified that I am speaking with the correct person using two identifiers.  Video connection was lost when less than 50% of the duration of the visit was complete, at which time the remainder of the visit was completed via audio only.   Location: Patient: group home  Provider: [facility]   I discussed the limitations of evaluation and management by telemedicine and the availability of in person appointments. The patient expressed understanding and agreed to proceed.   I discussed the assessment and treatment plan with the patient. The patient was provided an opportunity to ask questions and all were answered. The patient agreed with the plan and demonstrated an understanding of the instructions.   The patient was advised to call back or seek an in-person evaluation if the symptoms worsen or if the condition fails to improve as anticipated.  I provided 30 minutes of non-face-to-face time during this encounter.   Andres Sieh R Nyair Depaulo, LCSW  THERAPIST PROGRESS NOTE  Session Time: 1-130p  Participation Level: Active  Behavioral Response: Neat and Well GroomedAlertAngry, Anxious, and Irritable  Type of Therapy: Individual Therapy  Treatment Goals addressed:  Problem: Thought Disorders CCP Problem  1 Achieve controlled behavior, moderated mood, more deliberative speech and thought process, and a stable daily activity pattern.    Goal: LTG: Sustain symptom remission and continue the gains made during the acute phase of treatment:  Input needed on appropriate metric Outcome: Progressing Note: Pt progressing significantly since last session  Goal: LTG: Increase coping skills to promote long-term recovery and improve ability to perform daily activities Outcome: Progressing Note: Pt now in group home--able to perform daily activities better  Goal: STG:  Promote adherence to treatment regimen Outcome: Progressing Note: Pt more compliant with self care activities and medication management of symptoms   Goal: STG: @PREFFIRSTNAME @ will practice behavioral activation skills 3x per week for the next 4 weeks Outcome: Progressing Note: Pt performing activities in group home  Interventions:  tervention: Assess emotional status and coping mechanisms  Intervention: Assist patient to identify own strengths and abilities  Intervention: Identify effective coping behavior  Intervention: Assess for impulsive behaviors  Summary: Andres [name] is a 55 y.o. male who presents with improving behaviors associated with schizoaffective disorder diagnosis. Pt and pts sister present throughout session.   Pt reports that he was recently moved from his mobile home to a group home. Pt states that he feels the move was "part of God's plan for me" and that he is trying to make the best of the situation.  Pt states "I had more fun at home".  Allowed pt safe space to explore his feelings about the move. Discussed that pt may be in adjustment phase for months after the move, which can directly impact mood and stress/anxiety levels. Pt reports that he doesn't like getting up and showering every morning. Explored how personal hygiene was an issue in the past and the daily showering will be naturally a help for this. Discussed medication noncompliance and how the group home is now responsible for Andres [name]'s medication. Pt is happy that he is getting 3 meals per day.   Pt is also receiving his primary care and psychiatric care from doctors that go to the group home. Pts sister was inquiring about counseling services--encouraged pts sister to contact insurance company to see what is considered "in network"  and "out of network" after his move to the group home.   Encouraged pt to focus on self care and positive social engagement.  Pt denies any SI, HI, or AVH.  Continued  recommendations are as follows: self care behaviors, positive social engagements, focusing on overall work/home/life balance, and focusing on positive physical and emotional wellness.   Suicidal/Homicidal: No.   Therapist Response: Pt is continuing to apply interventions learned in session into daily life situations. Pt is currently on track to meet goals utilizing interventions mentioned above. Personal growth and progress noted. Treatment to continue as indicated.    Plan: Return again in 4 weeks.  Diagnosis: Axis I: schizoaffective disorder    Axis II: No diagnosis    Andres Haber [name], LCSW 11/26/2021

## 2021-12-22 ENCOUNTER — Telehealth: Payer: Self-pay

## 2021-12-22 NOTE — Telephone Encounter (Signed)
Medication management - Prior authorization request submitted online with CoverMyMeds for patient's represcribed Ingrezza 80 mg capsules.  Decision pending.

## 2022-01-05 ENCOUNTER — Ambulatory Visit (INDEPENDENT_AMBULATORY_CARE_PROVIDER_SITE_OTHER): Payer: Medicare Other | Admitting: Psychiatry

## 2022-01-05 ENCOUNTER — Encounter: Payer: Self-pay | Admitting: Psychiatry

## 2022-01-05 ENCOUNTER — Other Ambulatory Visit: Payer: Self-pay

## 2022-01-05 ENCOUNTER — Other Ambulatory Visit: Payer: Self-pay | Admitting: Family Medicine

## 2022-01-05 VITALS — BP 91/58 | HR 61 | Temp 97.3°F | Wt 139.0 lb

## 2022-01-05 DIAGNOSIS — G2401 Drug induced subacute dyskinesia: Secondary | ICD-10-CM | POA: Diagnosis not present

## 2022-01-05 DIAGNOSIS — F418 Other specified anxiety disorders: Secondary | ICD-10-CM | POA: Diagnosis not present

## 2022-01-05 DIAGNOSIS — F251 Schizoaffective disorder, depressive type: Secondary | ICD-10-CM

## 2022-01-05 DIAGNOSIS — F70 Mild intellectual disabilities: Secondary | ICD-10-CM | POA: Diagnosis not present

## 2022-01-05 DIAGNOSIS — I959 Hypotension, unspecified: Secondary | ICD-10-CM

## 2022-01-05 MED ORDER — VALBENAZINE TOSYLATE 80 MG PO CAPS: 80.0000 mg | ORAL_CAPSULE | Freq: Every day | ORAL | 7 refills | Status: AC

## 2022-01-05 MED ORDER — QUETIAPINE FUMARATE 25 MG PO TABS: 75.0000 mg | ORAL_TABLET | Freq: Every day | ORAL | 1 refills | Status: DC

## 2022-01-05 MED ORDER — TRAZODONE HCL 50 MG PO TABS: 50.0000 mg | ORAL_TABLET | Freq: Every day | ORAL | 1 refills | Status: AC

## 2022-01-05 NOTE — Telephone Encounter (Signed)
Requested medication (s) are due for refill today - no  Requested medication (s) are on the active medication list -no  Future visit scheduled -no  Last refill: today per refill request- outside provider  Notes to clinic: Request RF: may have already been filled, medication not assigned to protocol  Requested Prescriptions  Pending Prescriptions Disp Refills   INGREZZA 80 MG capsule [Pharmacy Med Name: INGREZZA 80 MG CAPSULE] 30 capsule 2    Sig: TAKE 1 CAPSULE BY MOUTH AT BEDTIME. (MAY OPEN & SPRINKLE ON FOOD.)     Off-Protocol Failed - 01/05/2022  1:43 PM      Failed - Medication not assigned to a protocol, review manually.      Passed - Valid encounter within last 12 months    Recent Outpatient Visits           2 months ago Schizoaffective disorder, depressive type [facility])   [place], DO   1 year ago Abnormal gait   [place], DO   1 year ago Annual physical exam   [facility] Olin Hauser, DO   1 year ago Schizoaffective disorder, depressive type [facility])   [facility] Olin Hauser, DO   2 years ago Annual physical exam   [facility] Olin Hauser, DO                 Requested Prescriptions  Pending Prescriptions Disp Refills   INGREZZA 80 MG capsule [Pharmacy Med Name: INGREZZA 80 MG CAPSULE] 30 capsule 2    Sig: TAKE 1 CAPSULE BY MOUTH AT BEDTIME. (MAY OPEN & SPRINKLE ON FOOD.)     Off-Protocol Failed - 01/05/2022  1:43 PM      Failed - Medication not assigned to a protocol, review manually.      Passed - Valid encounter within last 12 months    Recent Outpatient Visits           2 months ago Schizoaffective disorder, depressive type [facility])   [place], DO   1 year ago Abnormal gait   [place], DO   1 year  ago Annual physical exam   [facility] Olin Hauser, DO   1 year ago Schizoaffective disorder, depressive type [facility])   [place], DO   2 years ago Annual physical exam   [facility] Olin Hauser, DO

## 2022-01-05 NOTE — Progress Notes (Signed)
BH MD OP Progress Note  01/05/2022 11:01 AM Andres PennaBarry W Quimby  MRN:  [id number]  Chief Complaint:  Chief Complaint   Follow-up; Depression; Anxiety    HPI: Andres [name] is a 56 year old Caucasian male who has a history of schizoaffective disorder, hypothyroidism, mild intellectual disability, tardive dyskinesia, single, lives [place] in [place] group home was evaluated in office today.  Patient presented with his sister-[name] who provided collateral information.  Patient appeared to be withdrawn, minimal eye contact, seemed to be somnolent majority of the appointment.  However when questions were asked he was able to open his eyes, and answer appropriately.  Patient reports he feels sleepy since this appointment is too early for him and he is not a 'morning person'.  Patient throughout the session kept repeating that he does not like to be at his new group home.  He currently has his own room at the group home.  Moved in prior to Thanksgiving in November 2022.  It has been almost 2 months.  He reports he likes the food that is provided.  Has to share a bathroom with others.  He reports he does not like the fact that he does not have access to all the TV shows, cable that he  used to watch at home.  He reports he mostly stays in his room and does not have a lot of things to do at the group home.  He reports he misses his home and would like to go back home and does not want to live there.  Patient reports being at the group home does make him sad and homesick.  He otherwise denies any sleep problems, appetite changes, perceptual disturbances.  Reports he is compliant on medications.  Denies side effects.  Denies any suicidality or homicidality.  Per collateral information obtained from sister-[name]-patient continues to go to day program 2 days a week.  His Sister Waynetta SandyBeth picks him up and she drops him back at the group home at the end of the day.  She also sees him once a week when she takes him out for  supper.  Patient also got to spend time with family during the holiday season.  Initially when he was admitted to the group home he seemed anxious and the in-house psychiatrist increased the Seroquel dosage to 75 mg at bedtime.  Otherwise no medication changes have been made.  He seems to be doing okay although he does not like being there.  Patient's blood pressure in session was noted as low at 91/58.  Unknown if this is due to the increased dosage of Seroquel or the combination of Seroquel and trazodone.  Patient is also on propranolol which likely could be also contributing to this.  Patient with recent increase of Seroquel as noted above, was observed as having movements around his lips.  Per sister he does have it when he is agitated or frustrated.  Unknown if this is due to the higher dosage of Seroquel.  Patient is compliant on Ingrezza.  We will continue to reevaluate.  Patient continues to be motivated to stay in therapy and has upcoming appointment with Ms. Christina Hussami.  Visit Diagnosis:    ICD-10-CM   1. Schizoaffective disorder, depressive type (HCC)  F25.1 traZODone (DESYREL) 50 MG tablet    QUEtiapine (SEROQUEL) 25 MG tablet    2. Tardive dyskinesia  G24.01 valbenazine (INGREZZA) 80 MG capsule    3. Other specified anxiety disorders  F41.8    Limited  symptom attack    4. Mild intellectual disability  F70     5. Hypotension, unspecified hypotension type  I95.9       Past Psychiatric History: Reviewed past psychiatric history from progress note on 03/29/2018.  Past Medical History:  Past Medical History:  Diagnosis Date   Allergy     Past Surgical History:  Procedure Laterality Date   ANKLE SURGERY     COLON SURGERY     HERNIA REPAIR     INTRAMEDULLARY (IM) NAIL INTERTROCHANTERIC Right 06/18/2019   Procedure: INTRAMEDULLARY (IM) NAIL INTERTROCHANTRIC;  Surgeon: [name], [name], MD;  Location: [facility] ORS;  Service: Orthopedics;  Laterality: Right;   TONSILLECTOMY       Family Psychiatric History: Reviewed family psychiatric history from progress note on 03/29/2018.  Family History:  Family History  Problem Relation Age of Onset   Diabetes Mother    Stroke Mother    Cancer Father        colon   Colon cancer Father    Colon cancer Other    Mental illness Neg Hx     Social History: Reviewed social history from progress note on 03/29/2018. Social History   Socioeconomic History   Marital status: Single    Spouse name: Not on file   Number of children: 0   Years of education: High School   Highest education level: High school graduate  Occupational History    Comment: disabled  Tobacco Use   Smoking status: Never   Smokeless tobacco: Never  Vaping Use   Vaping Use: Never used  Substance and Sexual Activity   Alcohol use: No   Drug use: No   Sexual activity: Not Currently  Other Topics Concern   Not on file  Social History Narrative   Lives with parents, mental handicap, uses walker   Social Determinants of Health   Financial Resource Strain: Not on file  Food Insecurity: Not on file  Transportation Needs: Not on file  Physical Activity: Not on file  Stress: Not on file  Social Connections: Not on file    Allergies:  Allergies  Allergen Reactions   Prednisone Other (See Comments)    Did not like the way it made him feel  Did not like the way it made him feel    Lamotrigine Rash    Metabolic Disorder Labs: Lab Results  Component Value Date   HGBA1C 4.8 09/24/2019   MPG 91 09/24/2019   MPG 91 10/05/2018   No results found for: PROLACTIN Lab Results  Component Value Date   CHOL 143 10/08/2020   TRIG 101 10/08/2020   HDL 46 10/08/2020   CHOLHDL 3.1 10/08/2020   LDLCALC 79 10/08/2020   LDLCALC 89 09/24/2019   Lab Results  Component Value Date   TSH 4.47 10/08/2020   TSH 4.92 (H) 04/02/2020    Therapeutic Level Labs: No results found for: LITHIUM No results found for: VALPROATE No components found for:   CBMZ  Current Medications: Current Outpatient Medications  Medication Sig Dispense Refill   allopurinol (ZYLOPRIM) 100 MG tablet TAKE 1 TABLET(100 MG) BY MOUTH DAILY 90 tablet 3   azelastine (ASTELIN) 0.1 % nasal spray Place into both nostrils.     cholecalciferol (VITAMIN D) 1000 units tablet Take 1,000 Units by mouth daily.     citalopram (CELEXA) 40 MG tablet TAKE 1 TABLET(40 MG) BY MOUTH DAILY 90 tablet 2   fexofenadine (ALLEGRA) 180 MG tablet Take 180 mg by mouth.  ibuprofen (ADVIL) 200 MG tablet Take 200 mg by mouth 2 (two) times daily with a meal.     levothyroxine (SYNTHROID) 25 MCG tablet TAKE 1 TABLET BY MOUTH DAILY BEFORE BREAKFAST ON AN EMPTY STOMACH( NOT TO TAKE WITH OTHER MEDICINES) 90 tablet 3   ondansetron (ZOFRAN) 4 MG tablet Take by mouth.     propranolol (INDERAL) 10 MG tablet Take 1 tablet (10 mg total) by mouth 2 (two) times daily. For severe anxiety/agitation 180 tablet 2   QUEtiapine (SEROQUEL) 25 MG tablet Take 3 tablets (75 mg total) by mouth at bedtime. 90 tablet 1   thiamine (VITAMIN B-1) 100 MG tablet Take 100 mg by mouth daily.     traZODone (DESYREL) 50 MG tablet Take 1 tablet (50 mg total) by mouth at bedtime. Dose change 90 tablet 1   valbenazine (INGREZZA) 80 MG capsule Take 1 capsule (80 mg total) by mouth at bedtime. 30 capsule 7   amoxicillin (AMOXIL) 500 MG tablet Take 1,000 mg by mouth 2 (two) times daily. (Patient not taking: Reported on 01/05/2022)     No current facility-administered medications for this visit.     Musculoskeletal: Strength & Muscle Tone: within normal limits Gait & Station: normal Patient leans: Front  Psychiatric Specialty Exam: Review of Systems  Psychiatric/Behavioral:  Positive for dysphoric mood and sleep disturbance.   All other systems reviewed and are negative.  Blood pressure (!) 91/58, pulse 61, temperature (!) 97.3 F (36.3 C), temperature source Temporal, weight 139 lb (63 kg).Body mass index is 23.49 kg/m.   General Appearance: Casual  Eye Contact:  Poor  Speech:  Normal Rate  Volume:  Decreased  Mood:  Depressed  Affect:  Congruent  Thought Process:  Goal Directed and Descriptions of Associations: Intact  Orientation:  Full (Time, Place, and Person)  Thought Content: Logical   Suicidal Thoughts:  No  Homicidal Thoughts:  No  Memory:  Immediate;   Fair Recent;   Fair Remote;   Limited  Judgement:  Fair  Insight:  Shallow  Psychomotor Activity:  Normal  Concentration:  Concentration: Fair and Attention Span: Fair  Recall:  [employer] of Knowledge: Fair  Language: Fair  Akathisia:  No  Handed:  Right  AIMS (if indicated): done  Assets:  Communication Skills Desire for Improvement Housing Social Support Transportation  ADL's:  Intact  Cognition: Baseline  Sleep:   likely excessive   Screenings: AIMS    Flowsheet Row Office Visit from 01/05/2022 in [facility] Office Visit from 11/03/2021 in [facility] Office Visit from 08/04/2021 in [facility] Office Visit from 05/05/2021 in [facility] Office Visit from 01/11/2019 in [facility]  AIMS Total Score 5 4 4 4 5       GAD-7    Flowsheet Row Office Visit from 07/07/2018 in [facility]  Total GAD-7 Score 15      PHQ2-9    Flowsheet Row Office Visit from 11/03/2021 in [facility] Office Visit from 10/27/2021 in [facility] Counselor from 08/06/2021 in [facility] Office Visit from 05/05/2021 in [facility] Counselor from 04/16/2021 in [facility]  PHQ-2 Total Score 2 0 1 1 0  PHQ-9 Total Score 9 0 -- 3 --      Flowsheet Row Counselor from 11/26/2021 in [facility] Office Visit from 11/03/2021 in [facility] Counselor  from 10/08/2021 in [facility]  C-SSRS RISK CATEGORY No Risk No Risk Low Risk        Assessment and Plan: DREYDON CARDENAS is a 56 year old Caucasian male who has a history of schizoaffective disorder, diabetes, hypothyroidism, TD was evaluated in office today.  Patient with recent change of residence to a group home currently struggling with adjustment problems and also likely has side effects to medications, will benefit from following plan.  Plan Schizoaffective disorder-stable Continue Seroquel at 75 mg p.o. nightly Celexa 40 mg p.o. daily Will reduce trazodone to 50 mg p.o. nightly.  TD-stable Ingrezza 80 mg p.o. daily Encouraged compliance AIMS - 5  Other specified anxiety disorder-stable Continue CBT with Ms. Christina Hussami Celexa 40 mg p.o. daily  Mild intellectual disability-chronic Patient to continue day program  Hypertension-unspecified likely due to medications like Seroquel, trazodone-unstable Continue Seroquel at the same dosage however advised to follow up with primary care doctor as well as to monitor blood pressure closely.  We will consider reducing Seroquel in the future. We will reduce trazodone to 50 mg p.o. nightly. Patient is also on propranolol currently prescribed by primary care provider for hypertension-may need to hold this medication if blood pressure continues to stay low. I have discussed with sister-[name] to have a discussion with primary care.  Follow-up in clinic as needed.  Since patient is currently in a group home patient does have access to a psychiatrist in house.  This note was generated in part or whole with voice recognition software. Voice recognition is usually quite accurate but there are transcription errors that can and very often do occur. I apologize for any typographical errors that were not detected and corrected.    Jomarie Longs, MD 01/06/2022, 9:34 AM

## 2022-01-06 DIAGNOSIS — I959 Hypotension, unspecified: Secondary | ICD-10-CM | POA: Insufficient documentation

## 2022-01-08 NOTE — Telephone Encounter (Signed)
ingrezza was approved until 12-31-*23

## 2022-01-12 ENCOUNTER — Ambulatory Visit (INDEPENDENT_AMBULATORY_CARE_PROVIDER_SITE_OTHER): Payer: Medicare Other | Admitting: Licensed Clinical Social Worker

## 2022-01-12 ENCOUNTER — Other Ambulatory Visit: Payer: Self-pay

## 2022-01-12 DIAGNOSIS — F251 Schizoaffective disorder, depressive type: Secondary | ICD-10-CM | POA: Diagnosis not present

## 2022-01-12 NOTE — Progress Notes (Signed)
Virtual Visit via Video Note  I connected with Andres [name] on 01/12/22 at  1:00 PM EST by a video enabled telemedicine application and verified that I am speaking with the correct person using two identifiers.  Location: Patient: home  Provider: remote office [place], [place])   I discussed the limitations of evaluation and management by telemedicine and the availability of in person appointments. The patient expressed understanding and agreed to proceed.   I discussed the assessment and treatment plan with the patient. The patient was provided an opportunity to ask questions and all were answered. The patient agreed with the plan and demonstrated an understanding of the instructions.   The patient was advised to call back or seek an in-person evaluation if the symptoms worsen or if the condition fails to improve as anticipated.  I provided 30 minutes of non-face-to-face time during this encounter.   Andres Brassfield R Kazaria Gaertner, LCSW  THERAPIST PROGRESS NOTE  Session Time: 1-130p  Participation Level: Active  Behavioral Response: Neat and Well GroomedAlertAngry, Anxious, and Irritable  Type of Therapy: Individual Therapy  Treatment Goals addressed:  Problem: Thought Disorders CCP Problem  1 Achieve controlled behavior, moderated mood, more deliberative speech and thought process, and a stable daily activity pattern.    Goal: LTG: Sustain symptom remission and continue the gains made during the acute phase of treatment:  Input needed on appropriate metric (self report) Outcome: Progressing--pt sustaining symptoms remission  Goal: LTG: Increase coping skills to promote long-term recovery and improve ability to perform daily activities Outcome: Progressing  Goal: STG: Promote adherence to treatment regimen Outcome: Progressing--pt is compliant with medication  Goal: STG: @PREFFIRSTNAME @ will practice behavioral activation skills 3x per week for the next 4 weeks Outcome:  Progressing--pt engaging in activities on a regular basis  Interventions:   Intervention: Implement behavioral expectations  Intervention: Discuss self-management skills  Intervention: REVIEW PLEASE SKILLS (TREAT PHYSICAL ILLNESS, BALANCE EATING, AVOID MOOD-ALTERING SUBSTANCES, BALANCE SLEEP AND GET EXERCISE) WITH  Intervention: Give positive reinforcement and praise  Summary: Andres [name] is a 56 y.o. male who presents with escalating symptoms related to schizoaffective disorder diagnosis. Pt reports that he feels better and knows that recent changes are "all part of God's plans". Pt states his mood has been "alright" and sister confirms that things have been much more stable since pt has someone administering his medication on a regular basis.   Allowed pt to explore and express thoughts and feelings associated with recent life situations and external stressors. Andres [name] reports still that he would "rather be home than here".  Discussed adjustment phase/transition phase from being at home in his father's trailer and in current facility.  Discussed and praised pts recent medication compliance. Pt is also bathing every other day, which is an improvement from last session. Pt states that he has people there that help take care of him if he has bowel/bladder accidents, which is helpful to him. Pt is going on outings with other residents, and is engaging socially with others for most of the day, every day.  Pt is still able to go to [facility] program, where he sees individuals that he has had relationships with for years. Pt states that he is very happy that he can still go and see his friends. Pt states that he has visited a couple of local churches but hasn't been very happy with them "I go because its church".  Pt remains strong with his faith and discusses at times--but not overly preoccupied  with it, which is a sign of progress from previous sessions.   Pt reports that he has a healthy  appetite, is sleeping well, and is feeling okay for most of the days. Reviewed interventions that could be helpful on days where pt feels sad or overly anxious. Pt reflects understanding.  Continued recommendations are as follows: self care behaviors, positive social engagements, focusing on overall work/home/life balance, and focusing on positive physical and emotional wellness.    Suicidal/Homicidal: No.   Therapist Response: Pt is continuing to apply interventions learned in session into daily life situations. Pt is currently on track to meet goals utilizing interventions mentioned above. Personal growth and progress noted. Treatment to continue as indicated.   Plan: Return again in 4 weeks.  Diagnosis: Axis I: schizoaffective disorder    Axis II: No diagnosis    Andres [name] Andres Speece, LCSW 01/12/2022

## 2022-01-13 ENCOUNTER — Ambulatory Visit
Admission: EM | Admit: 2022-01-13 | Discharge: 2022-01-13 | Disposition: A | Discharge status: Home / Self Care | Payer: Medicare Other | Attending: Emergency Medicine | Admitting: Emergency Medicine

## 2022-01-13 ENCOUNTER — Other Ambulatory Visit: Payer: Self-pay

## 2022-01-13 DIAGNOSIS — E8809 Other disorders of plasma-protein metabolism, not elsewhere classified: Secondary | ICD-10-CM | POA: Diagnosis present

## 2022-01-13 DIAGNOSIS — E46 Unspecified protein-calorie malnutrition: Secondary | ICD-10-CM | POA: Diagnosis present

## 2022-01-13 LAB — CBC WITH DIFFERENTIAL/PLATELET
Abs Immature Granulocytes: 0 10*3/uL (ref 0.00–0.07)
Basophils Absolute: 0.1 10*3/uL (ref 0.0–0.1)
Basophils Relative: 1 %
Eosinophils Absolute: 0.2 10*3/uL (ref 0.0–0.5)
Eosinophils Relative: 6 %
HCT: 39.1 % (ref 39.0–52.0)
Hemoglobin: 13.2 g/dL (ref 13.0–17.0)
Immature Granulocytes: 0 %
Lymphocytes Relative: 36 %
Lymphs Abs: 1.4 10*3/uL (ref 0.7–4.0)
MCH: 30.8 pg (ref 26.0–34.0)
MCHC: 33.8 g/dL (ref 30.0–36.0)
MCV: 91.4 fL (ref 80.0–100.0)
Monocytes Absolute: 0.4 10*3/uL (ref 0.1–1.0)
Monocytes Relative: 10 %
Neutro Abs: 1.9 10*3/uL (ref 1.7–7.7)
Neutrophils Relative %: 47 %
Platelets: 116 10*3/uL — ABNORMAL LOW (ref 150–400)
RBC: 4.28 MIL/uL (ref 4.22–5.81)
RDW: 13.2 % (ref 11.5–15.5)
WBC: 4 10*3/uL (ref 4.0–10.5)
nRBC: 0 % (ref 0.0–0.2)

## 2022-01-13 LAB — COMPREHENSIVE METABOLIC PANEL
ALT: 20 U/L (ref 0–44)
AST: 26 U/L (ref 15–41)
Albumin: 3.3 g/dL — ABNORMAL LOW (ref 3.5–5.0)
Alkaline Phosphatase: 48 U/L (ref 38–126)
Anion gap: 6 (ref 5–15)
BUN: 22 mg/dL — ABNORMAL HIGH (ref 6–20)
CO2: 26 mmol/L (ref 22–32)
Calcium: 8.7 mg/dL — ABNORMAL LOW (ref 8.9–10.3)
Chloride: 108 mmol/L (ref 98–111)
Creatinine, Ser: 1.06 mg/dL (ref 0.61–1.24)
GFR, Estimated: >60 mL/min (ref >60)
Glucose, Bld: 75 mg/dL (ref 70–99)
Potassium: 4.3 mmol/L (ref 3.5–5.1)
Sodium: 140 mmol/L (ref 135–145)
Total Bilirubin: 0.5 mg/dL (ref 0.3–1.2)
Total Protein: 5.5 g/dL — ABNORMAL LOW (ref 6.5–8.1)

## 2022-01-13 LAB — URINALYSIS, COMPLETE (UACMP) WITH MICROSCOPIC
Bacteria, UA: NONE SEEN
Bilirubin Urine: NEGATIVE
Glucose, UA: NEGATIVE mg/dL
Hgb urine dipstick: NEGATIVE
Ketones, ur: NEGATIVE mg/dL
Leukocytes,Ua: NEGATIVE
Nitrite: NEGATIVE
Protein, ur: NEGATIVE mg/dL
Specific Gravity, Urine: 1.01 (ref 1.005–1.030)
pH: 6 (ref 5.0–8.0)

## 2022-01-13 LAB — GLUCOSE, CAPILLARY: Glucose-Capillary: 104 mg/dL — ABNORMAL HIGH (ref 70–99)

## 2022-01-13 NOTE — ED Triage Notes (Signed)
Patient is here with "Andres [name]" Sister [facility]  Hights) for "Confusion". Goes to day program on Mondays and Wednesdays [facility]). Church called sister saying "he is not himself". They found it odd "they he didn't want to play cards". In trying to engage him, noticed that he wasn't grasping directions, hold a card he was signing, wondering around". "Hungry" this morning "even after breakfast". No injury known. Pretty angry when going to door once sister got there, ? Knowledge of who sister is". Still "groggy". No other (known)/obvious symptoms.

## 2022-01-13 NOTE — Plan of Care (Signed)
°  Problem: Thought Disorders CCP Problem  1 Achieve controlled behavior, moderated mood, more deliberative speech and thought process, and a stable daily activity pattern.   Goal: LTG: Sustain symptom remission and continue the gains made during the acute phase of treatment:  Input needed on appropriate metric Outcome: Progressing Goal: LTG: Increase coping skills to promote long-term recovery and improve ability to perform daily activities Outcome: Progressing Goal: STG: Promote adherence to treatment regimen Outcome: Progressing Goal: STG: @PREFFIRSTNAME @ will practice behavioral activation skills 3x per week for the next 4 weeks Outcome: Progressing

## 2022-01-13 NOTE — Discharge Instructions (Addendum)
Increase your water intake so that you improve hydration.  Increase her dietary intake of protein rich foods such as meats and beans.  Return for reevaluation of any new, or worsening, symptoms that develop.

## 2022-01-13 NOTE — ED Provider Notes (Signed)
[facility]    CSN: [id number] Arrival date & time: 01/13/22  1052      History   Chief Complaint Chief Complaint  Patient presents with   Altered Mental Status    HPI Andres [name] is a 56 y.o. male.   HPI  56 year old male here for evaluation of confusion.  Patient is here with his sister, who is his POA, for evaluation of changes in behavior.  Patient was admitted to a group home just before Thanksgiving and he attends a day program Mondays and Wednesdays at church.  The director called the patient's sister to tell her that he was "not himself".  One of the oddities was that the group was going to engage in playing cards and the patient did not want to play cards.  Typically he is the first one to want to play cards amongst the group.  In addition, his typical routine includes eating breakfast, drinking water, visiting the bathroom, and then not drinking again until after lunch.  Today Andres [name] told the director that he was hungry and ate a sandwich at around 10:00 followed by consuming 2 bottles of water.  His sister arrived as he was finishing a sandwich and states that he is yet to use the bathroom.  The day program director and the patient sister also noticed that he was wandering and going places that he should not.  When his sister arrived he attempted to leave through the front door which is not typical.  He also had difficulty signing his name on a birthday card.  He is also had an increase in agitation.  The patient has had increased somnolence as well.  He recently had his Seroquel increased.  He is no longer being followed by his normal PCP or psychiatrist since he is in the group home.  The sister is unaware of any fever or cough.  Also unaware of any falls or injury.  The only change that she can think of was 2 weeks ago he had a 5-day course of increased stools that resulted in some episodes of stool incontinence.  He also had a single episode of nausea and vomiting  at this time.  His symptoms improved for couple days and then the diarrhea stools returned as well as the episodes of incontinence.  Those are not present now.  He has had decreased urination.  He is alert to himself and he can tell me the name of his group home.  He is able to follow commands and participate in history and physical.  Also of note, the sister indicates that while she was there is group was participating in chair exercises and while she was attempting to walk him to the exercise area he did appear to be off balance.  Patient does have a history of sleep disturbance.  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   Schizoaffective disorder, depressive type ([place]) 07/05/2019   Tardive dyskinesia 07/05/2019   Loose stools 07/07/2018   Weight loss 06/09/2018   Hypothyroidism 02/28/2018   Vitamin D deficiency, unspecified  02/28/2018   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   Development delay 01/18/2018   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   Localized edema 09/26/2015   Lack of expected normal physiological development 05/09/2015   FHx: colon cancer 05/09/2015   Essential hypertension 05/09/2015   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 Authorizing Provider  allopurinol (ZYLOPRIM) 100 MG tablet TAKE 1 TABLET(100 MG) BY MOUTH DAILY 06/08/21  Yes Karamalegos, Devonne Doughty, DO  cholecalciferol (VITAMIN D) 1000 units tablet Take 1,000 Units by mouth daily.   Yes [provider]  citalopram (CELEXA) 40 MG tablet TAKE 1 TABLET(40 MG) BY MOUTH DAILY 06/08/21  Yes Eappen, Ria Clock, MD  levothyroxine (SYNTHROID) 25 MCG tablet TAKE 1 TABLET BY MOUTH DAILY BEFORE BREAKFAST ON AN EMPTY STOMACH( NOT TO TAKE WITH OTHER MEDICINES) 06/08/21  Yes Karamalegos, Devonne Doughty, DO  propranolol (INDERAL) 10 MG tablet Take 1 tablet (10 mg total) by mouth 2 (two) times daily. For severe anxiety/agitation 10/27/21  Yes Karamalegos, Devonne Doughty, DO  QUEtiapine (SEROQUEL) 25 MG tablet Take 3 tablets (75 mg total) by mouth at bedtime. 01/05/22  Yes Ursula Alert, MD  thiamine (VITAMIN B-1) 100 MG tablet Take 100 mg by mouth daily.   Yes [provider]  traZODone (DESYREL) 50 MG tablet Take 1 tablet (50 mg total) by mouth at bedtime. Dose change 01/05/22  Yes Eappen, Ria Clock, MD  valbenazine (INGREZZA) 80 MG capsule Take 1 capsule (80 mg total) by mouth at bedtime. 01/05/22  Yes Ursula Alert, MD  amoxicillin (AMOXIL) 500 MG tablet Take 1,000 mg by mouth 2 (two) times daily. 12/29/21   [provider]  azelastine (ASTELIN) 0.1 % nasal spray Place into both nostrils. 12/29/21   [provider]  fexofenadine (ALLEGRA) 180 MG tablet Take 180 mg by mouth.    [provider]  ibuprofen (ADVIL) 200 MG tablet Take 200 mg by mouth 2 (two) times daily with a meal.    [provider]  ondansetron (ZOFRAN) 4 MG tablet Take by mouth. 12/30/21   [provider]    Family History Family History  Problem Relation Age of Onset   Diabetes Mother    Stroke Mother    Cancer Father        colon   Colon cancer Father    Colon cancer Other    Mental illness Neg Hx     Social History Social History   Tobacco Use   Smoking status: Never   Smokeless tobacco: Never  Vaping Use   Vaping Use: Never used  Substance Use Topics   Alcohol use: No   Drug use: No     Allergies   Prednisone and Lamotrigine   Review of Systems Review of Systems  Constitutional:  Negative for fever.  HENT:  Negative for congestion and rhinorrhea.   Respiratory:  Negative for cough.  Gastrointestinal:  Negative for diarrhea, nausea and vomiting.  Neurological:  Negative for dizziness, tremors, facial asymmetry, speech difficulty and weakness.  Psychiatric/Behavioral:  Positive for agitation, confusion and sleep disturbance.     Physical Exam Triage Vital Signs ED Triage Vitals  Enc Vitals Group     BP 01/13/22 1111 114/74     Pulse Rate 01/13/22 1111 62     Resp 01/13/22 1111 18     Temp 01/13/22 1111 98.2 F (36.8 C)     Temp Source 01/13/22 1111 Oral     SpO2 01/13/22 1111 100 %     Weight 01/13/22 1111 139 lb (63 kg)     Height --      Head Circumference --      Peak Flow --      Pain Score 01/13/22 1110 0     Pain Loc --      Pain Edu? --      Excl. in [place]? --    No data found.  Updated Vital Signs BP 114/74 (BP Location: Left Arm)    Pulse 62    Temp 98.2 F (36.8 C) (Oral)    Resp 18    Wt 139 lb (63 kg)    SpO2 100%    BMI 23.49 kg/m   Visual Acuity Right Eye Distance:   Left Eye Distance:   Bilateral Distance:    Right Eye Near:   Left Eye Near:    Bilateral Near:     Physical Exam Vitals and nursing note reviewed.  Constitutional:      General: He is not in acute distress.    Appearance: Normal appearance.  HENT:     Head: Normocephalic and atraumatic.     Right Ear: Tympanic membrane, ear canal and  external ear normal. There is no impacted cerumen.     Left Ear: Tympanic membrane, ear canal and external ear normal. There is no impacted cerumen.     Nose: Nose normal. No congestion or rhinorrhea.     Mouth/Throat:     Mouth: Mucous membranes are moist.     Pharynx: Oropharynx is clear. No posterior oropharyngeal erythema.  Cardiovascular:     Rate and Rhythm: Normal rate and regular rhythm.     Pulses: Normal pulses.     Heart sounds: Normal heart sounds. No murmur heard.   No friction rub. No gallop.  Pulmonary:     Effort: Pulmonary effort is normal.     Breath sounds: Normal breath sounds. No wheezing, rhonchi or rales.  Musculoskeletal:     Cervical back: Normal range of motion and neck supple.  Lymphadenopathy:     Cervical: No cervical adenopathy.  Skin:    General: Skin is warm and dry.     Capillary Refill: Capillary refill takes less than 2 seconds.     Findings: No bruising, erythema or rash.  Neurological:     General: No focal deficit present.     Mental Status: He is alert and oriented to person, place, and time.  Psychiatric:        Mood and Affect: Mood normal.        Behavior: Behavior normal.        Thought Content: Thought content normal.        Judgment: Judgment normal.     UC Treatments / Results  Labs (all labs ordered are listed, but only abnormal results are displayed) Labs Reviewed  CBC WITH DIFFERENTIAL/PLATELET - Abnormal; Notable for the  following components:      Result Value   Platelets 116 (*)    All other components within normal limits  COMPREHENSIVE METABOLIC PANEL - Abnormal; Notable for the following components:   BUN 22 (*)    Calcium 8.7 (*)    Total Protein 5.5 (*)    Albumin 3.3 (*)    All other components within normal limits  GLUCOSE, CAPILLARY - Abnormal; Notable for the following components:   Glucose-Capillary 104 (*)    All other components within normal limits  URINALYSIS, COMPLETE (UACMP) WITH MICROSCOPIC  CBG  MONITORING, ED    EKG   Radiology No results found.  Procedures Procedures (including critical care time)  Medications Ordered in UC Medications - No data to display  Initial Impression / Assessment and Plan / UC Course  I have reviewed the triage vital signs and the nursing notes.  Pertinent labs & imaging results that were available during my care of the patient were reviewed by me and considered in my medical decision making (see chart for details).  Patient is a pleasant, nontoxic-appearing 56 year old male here for evaluation of changes in behavior and confusion that started today while he was at a day program.  He is a resident of a group home and is only been in the group home since Thanksgiving.  Until May of last year he and his father live together but his father passed prompting the moved to the group home after a brief stay with his sister.  The change in behavior and daily routine alterations were discussed in the HPI above.  On physical exam patient has pearly-gray tympanic membranes bilaterally with normal light reflex and clear external auditory canals.  Nasal mucosa is pink and moist without erythema, edema, or discharge.  Oropharyngeal exam is benign.  No cervical lymphadenopathy appreciated exam.  Cardiopulmonary exam with clear lung sounds in all fields.  Given patient's increase in appetite, increased thirst, off-balance gait, and agitation is concerning for a metabolic process.  Patient does take citalopram which has the potential for hyponatremia.  He also had an increase of his Seroquel which may explain the somnolence.  Given the behavior changes I think it is appropriate to check a CBC, CMP, urinalysis, and perform a CBG.  With the absence of any known head trauma I do not feel a CT is warranted at this time.  Fingerstick glucose is 104.  Urinalysis is completely unremarkable.  CMP shows mildly increased BUN of 22 with decreased calcium of 8.7, decreased total  protein of 5.5, decreased albumin of 3.3.  Transaminases are normal.  Sodium is normal at 140 and potassium is normal at 4.3.  CBC shows thrombocytopenia with a platelet count of 116.  Patient has a history of low platelets and is most likely secondary to his Seroquel.  There is no evidence of infection.  Given the patient's mildly increased BUN, decreased total protein and albumin doubly the patient is to increase his water intake as well as increase his protein intake.  There is no definitive source of his change in behavior this morning.  I have spoken with the patient and his sister who will pass along to his group home and also to the other siblings but we discussed about increasing protein as well as increasing water intake.  I have also advised her to return with the patient for reevaluation if any new symptoms develop.  Patient is alert, oriented to baseline, and is able to repeat back what we  discussed at discharge.   Final Clinical Impressions(s) / UC Diagnoses   Final diagnoses:  Hypoalbuminemia due to protein-calorie malnutrition [facility])     Discharge Instructions      Increase your water intake so that you improve hydration.  Increase her dietary intake of protein rich foods such as meats and beans.  Return for reevaluation of any new, or worsening, symptoms that develop.     ED Prescriptions   None    PDMP not reviewed this encounter.   Margarette Canada, NP 01/13/22 1316

## 2022-01-20 ENCOUNTER — Other Ambulatory Visit: Payer: Self-pay

## 2022-01-20 ENCOUNTER — Encounter: Payer: Self-pay | Admitting: Emergency Medicine

## 2022-01-20 ENCOUNTER — Emergency Department: Payer: Medicare Other

## 2022-01-20 ENCOUNTER — Emergency Department
Admission: EM | Admit: 2022-01-20 | Discharge: 2022-01-20 | Disposition: A | Discharge status: Home / Self Care | Payer: Medicare Other | Attending: Emergency Medicine | Admitting: Emergency Medicine

## 2022-01-20 DIAGNOSIS — R4182 Altered mental status, unspecified: Secondary | ICD-10-CM | POA: Insufficient documentation

## 2022-01-20 DIAGNOSIS — Z8659 Personal history of other mental and behavioral disorders: Secondary | ICD-10-CM

## 2022-01-20 LAB — COMPREHENSIVE METABOLIC PANEL
ALT: 17 U/L (ref 0–44)
AST: 24 U/L (ref 15–41)
Albumin: 4 g/dL (ref 3.5–5.0)
Alkaline Phosphatase: 61 U/L (ref 38–126)
Anion gap: 8 (ref 5–15)
BUN: 25 mg/dL — ABNORMAL HIGH (ref 6–20)
CO2: 26 mmol/L (ref 22–32)
Calcium: 9.3 mg/dL (ref 8.9–10.3)
Chloride: 106 mmol/L (ref 98–111)
Creatinine, Ser: 1 mg/dL (ref 0.61–1.24)
GFR, Estimated: >60 mL/min (ref >60)
Glucose, Bld: 111 mg/dL — ABNORMAL HIGH (ref 70–99)
Potassium: 4.1 mmol/L (ref 3.5–5.1)
Sodium: 140 mmol/L (ref 135–145)
Total Bilirubin: 0.5 mg/dL (ref 0.3–1.2)
Total Protein: 6.3 g/dL — ABNORMAL LOW (ref 6.5–8.1)

## 2022-01-20 LAB — CBC
HCT: 42.3 % (ref 39.0–52.0)
Hemoglobin: 14.3 g/dL (ref 13.0–17.0)
MCH: 31.2 pg (ref 26.0–34.0)
MCHC: 33.8 g/dL (ref 30.0–36.0)
MCV: 92.4 fL (ref 80.0–100.0)
Platelets: 133 10*3/uL — ABNORMAL LOW (ref 150–400)
RBC: 4.58 MIL/uL (ref 4.22–5.81)
RDW: 13.4 % (ref 11.5–15.5)
WBC: 4.3 10*3/uL (ref 4.0–10.5)
nRBC: 0 % (ref 0.0–0.2)

## 2022-01-20 LAB — URINALYSIS, ROUTINE W REFLEX MICROSCOPIC
Bilirubin Urine: NEGATIVE
Glucose, UA: NEGATIVE mg/dL
Hgb urine dipstick: NEGATIVE
Ketones, ur: NEGATIVE mg/dL
Leukocytes,Ua: NEGATIVE
Nitrite: NEGATIVE
Protein, ur: NEGATIVE mg/dL
Specific Gravity, Urine: 1.01 (ref 1.005–1.030)
pH: 6.5 (ref 5.0–8.0)

## 2022-01-20 NOTE — ED Triage Notes (Signed)
Pt comes into the ED via POV with his sisters (POA) c/o AMS.  Pt states he lives at [facility] in [place] and he explains that the staff kept asking him to do things yesterday and he wasn't able to complete them.  Pt has had a couple episodes similar to this where he is acting different from baseline.  Pt explains that it isn't as if he doesn't want to do what they ask, but he cant physically make himself do it.  Pt currently in NAD at this time with even and unlabored respirations.

## 2022-01-20 NOTE — ED Notes (Signed)
Pt resting comfortably in bed at this time. Pt is alert and oriented with even and regular respirations. No acute distress noted. Pt denies any needs at this time. Call light within reach. Family remains at bedside. °

## 2022-01-20 NOTE — ED Notes (Signed)
Patient transported to CT 

## 2022-01-20 NOTE — ED Notes (Signed)
Pt discharge information reviewed. Pt understands need for follow up care and when to return if symptoms worsen. All questions answered. Pt is alert and oriented with even and regular respirations. Pt is seen ambulating out of department with string steady gait with family.  °

## 2022-01-20 NOTE — ED Provider Notes (Signed)
[facility] Provider Note    Event Date/Time   First MD Initiated Contact with Patient 01/20/22 1731     (approximate)  History   Altered Mental Status  HPI Andres [name] is a 56 y.o. male with a history of schizoaffective disorder who presents from a long-term care facility today due to reported altered mental status.  Family is at bedside and provides much of this history however from patient states that he was asked to take a shower last night and did not want to which is out of the ordinary for him and the staff was concerned.  Patient's family at bedside states that he was evaluated by a psychiatrist at his facility today who is concerned that there may be a more organic/medical cause for patient's obstinacy as this is very out of character.  Patient and family state that he is back to baseline at this time    Physical Exam   Triage Vital Signs: ED Triage Vitals  Enc Vitals Group     BP 01/20/22 1723 (!) 162/105     Pulse Rate 01/20/22 1723 (!) 50     Resp 01/20/22 1723 20     Temp 01/20/22 1723 97.6 F (36.4 C)     Temp Source 01/20/22 1723 Oral     SpO2 01/20/22 1723 92 %     Weight 01/20/22 1725 139 lb (63 kg)     Height 01/20/22 1725 5' 4.5" (1.638 m)     Head Circumference --      Peak Flow --      Pain Score 01/20/22 1724 0     Pain Loc --      Pain Edu? --      Excl. in [place]? --     Most recent vital signs: Vitals:   01/20/22 2230 01/20/22 2300  BP: (!) 153/92 (!) 163/120  Pulse: (!) 50 (!) 57  Resp: 12 17  Temp:    SpO2: 100% 100%    General: Awake, no distress.  CV:  Good peripheral perfusion.  Resp:  Normal effort.  Abd:  No distention.  Other:  Middle-aged Caucasian male laying in bed in no distress with slightly pressured speech   ED Results / Procedures / Treatments   Labs (all labs ordered are listed, but only abnormal results are displayed) Labs Reviewed  COMPREHENSIVE METABOLIC PANEL - Abnormal; Notable for the  following components:      Result Value   Glucose, Bld 111 (*)    BUN 25 (*)    Total Protein 6.3 (*)    All other components within normal limits  CBC - Abnormal; Notable for the following components:   Platelets 133 (*)    All other components within normal limits  URINALYSIS, ROUTINE W REFLEX MICROSCOPIC     EKG ED ECG REPORT I, Merwyn Katos, the attending physician, personally viewed and interpreted this ECG.  Date: 01/20/2022 EKG Time: 1737 Rate: 51 Rhythm: normal sinus rhythm QRS Axis: normal Intervals: normal ST/T Wave abnormalities: normal Narrative Interpretation: no evidence of acute ischemia   RADIOLOGY ED MD interpretation: CT of the head without contrast shows no evidence of acute abnormalities including no intracerebral hemorrhage, obvious masses, or significant edema  Official radiology report(s): CT Head Wo Contrast  Result Date: 01/20/2022 CLINICAL DATA:  Mental status change. EXAM: CT HEAD WITHOUT CONTRAST TECHNIQUE: Contiguous axial images were obtained from the base of the skull through the vertex without intravenous contrast. RADIATION DOSE  REDUCTION: This exam was performed according to the departmental dose-optimization program which includes automated exposure control, adjustment of the mA and/or kV according to patient size and/or use of iterative reconstruction technique. COMPARISON:  None. FINDINGS: Brain: No intracranial hemorrhage, mass effect, or midline shift. No hydrocephalus. The basilar cisterns are patent. No evidence of territorial infarct or acute ischemia. No extra-axial or intracranial fluid collection. Vascular: No hyperdense vessel or unexpected calcification. Skull: Normal. Negative for fracture or focal lesion. Sinuses/Orbits: No acute finding. Other: None. IMPRESSION: Negative noncontrast head CT. Electronically Signed   By: Narda Rutherford M.D.   On: 01/20/2022 19:56      PROCEDURES:  Critical Care performed:  No  Procedures   MEDICATIONS ORDERED IN ED: Medications - No data to display   IMPRESSION / MDM / ASSESSMENT AND PLAN / ED COURSE  I reviewed the triage vital signs and the nursing notes.                              Differential diagnosis includes, but is not limited to, metabolic encephalopathy, urinary tract infection, intracranial hemorrhage, intracranial mass, sepsis  The patient is on the cardiac monitor to evaluate for evidence of arrhythmia and/or significant heart rate changes.  Patient a 56 year old male who presents with his sister's concern for change in patient's demeanor over the last 24 hours.  Family very concerned as they have stated that when he did this in the past he ended up having a urinary tract infection and was needing hospitalization.  Patient's work-up including CBC, CMP, and UA only significant for mild hypoproteinemia of 6.3 is actually improved from 8 days prior where it was 5.5 and per note from 01/13/2022 from urgent care was diagnosed with hypoalbuminemia due to decreased p.o. intake.  Patient is at his baseline now and in further discussion with patient's family, he has done this in the past whenever he "tests the boundaries" of a new care provider.  I discussed at length patient's psychopharmaceutical regimen but did not make any changes as patient is seen monthly by a psychiatrist at his long-term care facility.  Patient's family was asked to discuss any further changes with his psychiatrist.  The patient has been reexamined and is ready to be discharged.  All diagnostic results have been reviewed and discussed with the patient/family.  Care plan has been outlined and the patient/family understands all current diagnoses, results, and treatment plans.  There are no new complaints, changes, or physical findings at this time.  All questions have been addressed and answered.  Patient was instructed to, and agrees to follow-up with their primary care physician as well  as return to the emergency department if any new or worsening symptoms develop.      FINAL CLINICAL IMPRESSION(S) / ED DIAGNOSES   Final diagnoses:  Mental status change resolved     Rx / DC Orders   ED Discharge Orders     None        Note:  This document was prepared using Dragon voice recognition software and may include unintentional dictation errors.   Merwyn Katos, MD 01/21/22 [phone]

## 2022-02-09 ENCOUNTER — Other Ambulatory Visit: Payer: Self-pay | Admitting: Psychiatry

## 2022-02-09 DIAGNOSIS — F251 Schizoaffective disorder, depressive type: Secondary | ICD-10-CM

## 2022-02-17 ENCOUNTER — Emergency Department: Payer: Medicare Other

## 2022-02-17 ENCOUNTER — Other Ambulatory Visit: Payer: Self-pay

## 2022-02-17 ENCOUNTER — Inpatient Hospital Stay: Payer: Medicare Other

## 2022-02-17 ENCOUNTER — Inpatient Hospital Stay
Admission: EM | Admit: 2022-02-17 | Discharge: 2022-02-23 | DRG: 481 | Disposition: A | Discharge status: Skilled Nursing Facility | Payer: Medicare Other | Attending: Internal Medicine | Admitting: Internal Medicine

## 2022-02-17 DIAGNOSIS — Z66 Do not resuscitate: Secondary | ICD-10-CM | POA: Diagnosis present

## 2022-02-17 DIAGNOSIS — Z79890 Hormone replacement therapy (postmenopausal): Secondary | ICD-10-CM

## 2022-02-17 DIAGNOSIS — Z20822 Contact with and (suspected) exposure to covid-19: Secondary | ICD-10-CM | POA: Diagnosis present

## 2022-02-17 DIAGNOSIS — S72002A Fracture of unspecified part of neck of left femur, initial encounter for closed fracture: Secondary | ICD-10-CM | POA: Diagnosis not present

## 2022-02-17 DIAGNOSIS — E039 Hypothyroidism, unspecified: Secondary | ICD-10-CM | POA: Diagnosis present

## 2022-02-17 DIAGNOSIS — F251 Schizoaffective disorder, depressive type: Secondary | ICD-10-CM | POA: Diagnosis present

## 2022-02-17 DIAGNOSIS — Z833 Family history of diabetes mellitus: Secondary | ICD-10-CM

## 2022-02-17 DIAGNOSIS — M109 Gout, unspecified: Secondary | ICD-10-CM | POA: Diagnosis present

## 2022-02-17 DIAGNOSIS — N401 Enlarged prostate with lower urinary tract symptoms: Secondary | ICD-10-CM | POA: Diagnosis present

## 2022-02-17 DIAGNOSIS — S72142A Displaced intertrochanteric fracture of left femur, initial encounter for closed fracture: Principal | ICD-10-CM | POA: Diagnosis present

## 2022-02-17 DIAGNOSIS — Y92099 Unspecified place in other non-institutional residence as the place of occurrence of the external cause: Secondary | ICD-10-CM | POA: Diagnosis not present

## 2022-02-17 DIAGNOSIS — G2401 Drug induced subacute dyskinesia: Secondary | ICD-10-CM | POA: Diagnosis present

## 2022-02-17 DIAGNOSIS — Z79899 Other long term (current) drug therapy: Secondary | ICD-10-CM

## 2022-02-17 DIAGNOSIS — E440 Moderate protein-calorie malnutrition: Secondary | ICD-10-CM | POA: Diagnosis present

## 2022-02-17 DIAGNOSIS — D6489 Other specified anemias: Secondary | ICD-10-CM | POA: Diagnosis present

## 2022-02-17 DIAGNOSIS — I110 Hypertensive heart disease with heart failure: Secondary | ICD-10-CM | POA: Diagnosis present

## 2022-02-17 DIAGNOSIS — I10 Essential (primary) hypertension: Secondary | ICD-10-CM | POA: Diagnosis not present

## 2022-02-17 DIAGNOSIS — Z823 Family history of stroke: Secondary | ICD-10-CM | POA: Diagnosis not present

## 2022-02-17 DIAGNOSIS — R339 Retention of urine, unspecified: Secondary | ICD-10-CM | POA: Diagnosis not present

## 2022-02-17 DIAGNOSIS — W19XXXA Unspecified fall, initial encounter: Secondary | ICD-10-CM

## 2022-02-17 DIAGNOSIS — W1839XA Other fall on same level, initial encounter: Secondary | ICD-10-CM | POA: Diagnosis present

## 2022-02-17 DIAGNOSIS — Z6822 Body mass index (BMI) 22.0-22.9, adult: Secondary | ICD-10-CM

## 2022-02-17 DIAGNOSIS — D649 Anemia, unspecified: Secondary | ICD-10-CM | POA: Diagnosis present

## 2022-02-17 DIAGNOSIS — Z800 Family history of malignant neoplasm of digestive organs: Secondary | ICD-10-CM | POA: Diagnosis not present

## 2022-02-17 DIAGNOSIS — I5032 Chronic diastolic (congestive) heart failure: Secondary | ICD-10-CM | POA: Diagnosis present

## 2022-02-17 DIAGNOSIS — F419 Anxiety disorder, unspecified: Secondary | ICD-10-CM | POA: Diagnosis present

## 2022-02-17 DIAGNOSIS — M7989 Other specified soft tissue disorders: Secondary | ICD-10-CM

## 2022-02-17 DIAGNOSIS — R338 Other retention of urine: Secondary | ICD-10-CM | POA: Diagnosis present

## 2022-02-17 DIAGNOSIS — Z419 Encounter for procedure for purposes other than remedying health state, unspecified: Secondary | ICD-10-CM

## 2022-02-17 HISTORY — DX: Drug induced subacute dyskinesia: G24.01

## 2022-02-17 HISTORY — DX: Hypothyroidism, unspecified: E03.9

## 2022-02-17 HISTORY — DX: Essential (primary) hypertension: I10

## 2022-02-17 LAB — URINALYSIS, ROUTINE W REFLEX MICROSCOPIC
Bilirubin Urine: NEGATIVE
Glucose, UA: NEGATIVE mg/dL
Hgb urine dipstick: NEGATIVE
Ketones, ur: NEGATIVE mg/dL
Leukocytes,Ua: NEGATIVE
Nitrite: NEGATIVE
Protein, ur: NEGATIVE mg/dL
Specific Gravity, Urine: 1.012 (ref 1.005–1.030)
pH: 6 (ref 5.0–8.0)

## 2022-02-17 LAB — COMPREHENSIVE METABOLIC PANEL
ALT: 16 U/L (ref 0–44)
AST: 24 U/L (ref 15–41)
Albumin: 3.1 g/dL — ABNORMAL LOW (ref 3.5–5.0)
Alkaline Phosphatase: 46 U/L (ref 38–126)
Anion gap: 8 (ref 5–15)
BUN: 28 mg/dL — ABNORMAL HIGH (ref 6–20)
CO2: 21 mmol/L — ABNORMAL LOW (ref 22–32)
Calcium: 8.4 mg/dL — ABNORMAL LOW (ref 8.9–10.3)
Chloride: 112 mmol/L — ABNORMAL HIGH (ref 98–111)
Creatinine, Ser: 0.9 mg/dL (ref 0.61–1.24)
GFR, Estimated: >60 mL/min (ref >60)
Glucose, Bld: 108 mg/dL — ABNORMAL HIGH (ref 70–99)
Potassium: 3.9 mmol/L (ref 3.5–5.1)
Sodium: 141 mmol/L (ref 135–145)
Total Bilirubin: 1.2 mg/dL (ref 0.3–1.2)
Total Protein: 5.3 g/dL — ABNORMAL LOW (ref 6.5–8.1)

## 2022-02-17 LAB — CBC WITH DIFFERENTIAL/PLATELET
Abs Immature Granulocytes: 0.01 10*3/uL (ref 0.00–0.07)
Basophils Absolute: 0 10*3/uL (ref 0.0–0.1)
Basophils Relative: 1 %
Eosinophils Absolute: 0.3 10*3/uL (ref 0.0–0.5)
Eosinophils Relative: 6 %
HCT: 27.9 % — ABNORMAL LOW (ref 39.0–52.0)
Hemoglobin: 9.4 g/dL — ABNORMAL LOW (ref 13.0–17.0)
Immature Granulocytes: 0 %
Lymphocytes Relative: 21 %
Lymphs Abs: 1.1 10*3/uL (ref 0.7–4.0)
MCH: 31 pg (ref 26.0–34.0)
MCHC: 33.7 g/dL (ref 30.0–36.0)
MCV: 92.1 fL (ref 80.0–100.0)
Monocytes Absolute: 0.5 10*3/uL (ref 0.1–1.0)
Monocytes Relative: 9 %
Neutro Abs: 3.2 10*3/uL (ref 1.7–7.7)
Neutrophils Relative %: 63 %
Platelets: 153 10*3/uL (ref 150–400)
RBC: 3.03 MIL/uL — ABNORMAL LOW (ref 4.22–5.81)
RDW: 13.8 % (ref 11.5–15.5)
WBC: 5 10*3/uL (ref 4.0–10.5)
nRBC: 0.4 % — ABNORMAL HIGH (ref 0.0–0.2)

## 2022-02-17 LAB — TYPE AND SCREEN
ABO/RH(D): A POS
Antibody Screen: NEGATIVE

## 2022-02-17 LAB — RESP PANEL BY RT-PCR (FLU A&B, COVID) ARPGX2
Influenza A by PCR: NEGATIVE
Influenza B by PCR: NEGATIVE
SARS Coronavirus 2 by RT PCR: NEGATIVE

## 2022-02-17 LAB — BRAIN NATRIURETIC PEPTIDE: B Natriuretic Peptide: 58.3 pg/mL (ref 0.0–100.0)

## 2022-02-17 LAB — APTT: aPTT: 28 seconds (ref 24–36)

## 2022-02-17 LAB — PROTIME-INR
INR: 1 (ref 0.8–1.2)
Prothrombin Time: 12.8 seconds (ref 11.4–15.2)

## 2022-02-17 MED ORDER — THIAMINE HCL 100 MG PO TABS
100.0000 mg | ORAL_TABLET | Freq: Every day | ORAL | Status: DC
Start: 2022-02-18 — End: 2022-02-24
  Administered 2022-02-18 – 2022-02-23 (×6): 100 mg via ORAL
  Filled 2022-02-17 (×6): qty 1

## 2022-02-17 MED ORDER — OXYCODONE-ACETAMINOPHEN 5-325 MG PO TABS
1.0000 | ORAL_TABLET | ORAL | Status: DC | PRN
  Administered 2022-02-17 – 2022-02-23 (×8): 1 via ORAL
  Filled 2022-02-17 (×8): qty 1

## 2022-02-17 MED ORDER — METHOCARBAMOL 500 MG PO TABS
500.0000 mg | ORAL_TABLET | Freq: Three times a day (TID) | ORAL | Status: DC | PRN
  Filled 2022-02-17: qty 1

## 2022-02-17 MED ORDER — HYDRALAZINE HCL 20 MG/ML IJ SOLN: 5.0000 mg | INTRAMUSCULAR | Status: DC | PRN

## 2022-02-17 MED ORDER — DOCUSATE SODIUM 100 MG PO CAPS
100.0000 mg | ORAL_CAPSULE | Freq: Every day | ORAL | Status: DC
  Administered 2022-02-18: 100 mg via ORAL
  Filled 2022-02-17: qty 1

## 2022-02-17 MED ORDER — TRAZODONE HCL 50 MG PO TABS
50.0000 mg | ORAL_TABLET | Freq: Every day | ORAL | Status: DC
  Administered 2022-02-17 – 2022-02-22 (×6): 50 mg via ORAL
  Filled 2022-02-17 (×6): qty 1

## 2022-02-17 MED ORDER — PROPRANOLOL HCL 10 MG PO TABS
5.0000 mg | ORAL_TABLET | Freq: Two times a day (BID) | ORAL | Status: DC
Start: 2022-02-17 — End: 2022-02-24
  Administered 2022-02-17 – 2022-02-23 (×12): 5 mg via ORAL
  Filled 2022-02-17 (×14): qty 0.5

## 2022-02-17 MED ORDER — MORPHINE SULFATE (PF) 2 MG/ML IV SOLN: 2.0000 mg | INTRAVENOUS | Status: DC | PRN

## 2022-02-17 MED ORDER — VITAMIN D 25 MCG (1000 UNIT) PO TABS
1000.0000 [IU] | ORAL_TABLET | Freq: Every day | ORAL | Status: DC
  Administered 2022-02-18 – 2022-02-23 (×6): 1000 [IU] via ORAL
  Filled 2022-02-17 (×6): qty 1

## 2022-02-17 MED ORDER — CITALOPRAM HYDROBROMIDE 10 MG PO TABS
40.0000 mg | ORAL_TABLET | Freq: Every day | ORAL | Status: DC
Start: 2022-02-18 — End: 2022-02-24
  Administered 2022-02-18 – 2022-02-23 (×6): 40 mg via ORAL
  Filled 2022-02-17 (×6): qty 4

## 2022-02-17 MED ORDER — ACETAMINOPHEN 325 MG PO TABS
650.0000 mg | ORAL_TABLET | Freq: Four times a day (QID) | ORAL | Status: DC | PRN
Start: 2022-02-17 — End: 2022-02-24
  Administered 2022-02-20: 650 mg via ORAL
  Filled 2022-02-17: qty 2

## 2022-02-17 MED ORDER — SENNOSIDES-DOCUSATE SODIUM 8.6-50 MG PO TABS: 1.0000 | ORAL_TABLET | Freq: Every evening | ORAL | Status: DC | PRN

## 2022-02-17 MED ORDER — LEVOTHYROXINE SODIUM 25 MCG PO TABS
25.0000 ug | ORAL_TABLET | Freq: Every day | ORAL | Status: DC
  Administered 2022-02-18 – 2022-02-23 (×6): 25 ug via ORAL
  Filled 2022-02-17 (×6): qty 1

## 2022-02-17 MED ORDER — ALLOPURINOL 100 MG PO TABS
100.0000 mg | ORAL_TABLET | Freq: Every day | ORAL | Status: DC
Start: 2022-02-18 — End: 2022-02-24
  Administered 2022-02-18 – 2022-02-23 (×6): 100 mg via ORAL
  Filled 2022-02-17 (×7): qty 1

## 2022-02-17 MED ORDER — VALBENAZINE TOSYLATE 40 MG PO CAPS
80.0000 mg | ORAL_CAPSULE | Freq: Every day | ORAL | Status: DC
  Administered 2022-02-17 – 2022-02-22 (×6): 80 mg via ORAL
  Filled 2022-02-17 (×7): qty 2

## 2022-02-17 MED ORDER — QUETIAPINE FUMARATE 25 MG PO TABS
25.0000 mg | ORAL_TABLET | Freq: Three times a day (TID) | ORAL | Status: DC
  Administered 2022-02-17 – 2022-02-23 (×17): 25 mg via ORAL
  Filled 2022-02-17 (×17): qty 1

## 2022-02-17 MED ORDER — ONDANSETRON HCL 4 MG/2ML IJ SOLN
4.0000 mg | Freq: Three times a day (TID) | INTRAMUSCULAR | Status: DC | PRN
Start: 2022-02-17 — End: 2022-02-18

## 2022-02-17 MED ORDER — LORATADINE 10 MG PO TABS
10.0000 mg | ORAL_TABLET | Freq: Every day | ORAL | Status: DC
Start: 2022-02-18 — End: 2022-02-24
  Administered 2022-02-18 – 2022-02-23 (×6): 10 mg via ORAL
  Filled 2022-02-17 (×6): qty 1

## 2022-02-17 NOTE — Consult Note (Signed)
ORTHOPAEDIC CONSULTATION  REQUESTING PHYSICIAN: Lorretta Harp, MD  Chief Complaint: left hip pain  HPI: Andres [name] is a 56 y.o. male who complains of left hip pain after fall a week ago. The pain is sharp in character. The pain is severe and 10/10. The pain is worse with movement and better with rest. Denies any numbness, tingling or constitutional symptoms.  Past Medical History:  Diagnosis Date   Allergy    Past Surgical History:  Procedure Laterality Date   ANKLE SURGERY     COLON SURGERY     HERNIA REPAIR     INTRAMEDULLARY (IM) NAIL INTERTROCHANTERIC Right 06/18/2019   Procedure: INTRAMEDULLARY (IM) NAIL INTERTROCHANTRIC;  Surgeon: Lyndle Herrlich, MD;  Location: [facility] ORS;  Service: Orthopedics;  Laterality: Right;   TONSILLECTOMY     Social History   Socioeconomic History   Marital status: Single    Spouse name: Not on file   Number of children: 0   Years of education: High School   Highest education level: High school graduate  Occupational History    Comment: disabled  Tobacco Use   Smoking status: Never   Smokeless tobacco: Never  Vaping Use   Vaping Use: Never used  Substance and Sexual Activity   Alcohol use: No   Drug use: No   Sexual activity: Not Currently  Other Topics Concern   Not on file  Social History Narrative   Lives with parents, mental handicap, uses walker   Social Determinants of Health   Financial Resource Strain: Not on file  Food Insecurity: Not on file  Transportation Needs: Not on file  Physical Activity: Not on file  Stress: Not on file  Social Connections: Not on file   Family History  Problem Relation Age of Onset   Diabetes Mother    Stroke Mother    Cancer Father        colon   Colon cancer Father    Colon cancer Other    Mental illness Neg Hx    Allergies  Allergen Reactions   Prednisone Other (See Comments)    Did not like the way it made him feel  Did not like the way it made him feel    Lamotrigine Rash    Prior to Admission medications   Medication Sig Start Date End Date Taking? Authorizing Provider  allopurinol (ZYLOPRIM) 100 MG tablet TAKE 1 TABLET(100 MG) BY MOUTH DAILY 06/08/21   Althea Charon, Netta Neat, DO  amoxicillin (AMOXIL) 500 MG tablet Take 1,000 mg by mouth 2 (two) times daily. Patient not taking: Reported on 02/17/2022 12/29/21   [provider]  azelastine (ASTELIN) 0.1 % nasal spray Place into both nostrils. 12/29/21   [provider]  cholecalciferol (VITAMIN D) 1000 units tablet Take 1,000 Units by mouth daily.    [provider]  citalopram (CELEXA) 40 MG tablet TAKE 1 TABLET(40 MG) BY MOUTH DAILY 06/08/21   Jomarie Longs, MD  fexofenadine (ALLEGRA) 180 MG tablet Take 180 mg by mouth.    [provider]  ibuprofen (ADVIL) 200 MG tablet Take 200 mg by mouth 2 (two) times daily with a meal.    [provider]  levothyroxine (SYNTHROID) 25 MCG tablet TAKE 1 TABLET BY MOUTH DAILY BEFORE BREAKFAST ON AN EMPTY STOMACH( NOT TO TAKE WITH OTHER MEDICINES) 06/08/21   Smitty Cords, DO  ondansetron (ZOFRAN) 4 MG tablet Take by mouth. 12/30/21   [provider]  propranolol (INDERAL) 10 MG tablet Take  1 tablet (10 mg total) by mouth 2 (two) times daily. For severe anxiety/agitation 10/27/21   Smitty Cords, DO  QUEtiapine (SEROQUEL) 25 MG tablet Take 3 tablets (75 mg total) by mouth at bedtime. 01/05/22   Jomarie Longs, MD  QUEtiapine (SEROQUEL) 50 MG tablet Take 2 tablets by mouth at bedtime. Patient not taking: Reported on 02/17/2022 01/07/22   [provider]  thiamine (VITAMIN B-1) 100 MG tablet Take 100 mg by mouth daily.    [provider]  traZODone (DESYREL) 50 MG tablet Take 1 tablet (50 mg total) by mouth at bedtime. Dose change 01/05/22   Jomarie Longs, MD  valbenazine [facility]) 80 MG capsule Take 1 capsule (80 mg total) by mouth at bedtime. 01/05/22   Jomarie Longs, MD   DG Chest 1  View  Result Date: 02/17/2022 CLINICAL DATA:  LEFT hip and knee pain post fall EXAM: CHEST  1 VIEW COMPARISON:  06/17/2019 FINDINGS: Normal heart size, mediastinal contours, and pulmonary vascularity. Lungs clear. No infiltrate, pleural effusion, or pneumothorax. Bones demineralized. IMPRESSION: No acute abnormalities. Electronically Signed   By: Ulyses Southward M.D.   On: 02/17/2022 12:27   CT Head Wo Contrast  Result Date: 02/17/2022 CLINICAL DATA:  Patient with fall. EXAM: CT HEAD WITHOUT CONTRAST TECHNIQUE: Contiguous axial images were obtained from the base of the skull through the vertex without intravenous contrast. RADIATION DOSE REDUCTION: This exam was performed according to the departmental dose-optimization program which includes automated exposure control, adjustment of the mA and/or kV according to patient size and/or use of iterative reconstruction technique. COMPARISON:  Brain CT 01/20/2022. FINDINGS: Brain: Ventricles and sulci are appropriate for patient's age. No evidence for acute cortical based infarct, intracranial hemorrhage, mass lesion or mass effect. Vascular: Unremarkable Skull: Intact. Sinuses/Orbits: Paranasal sinuses are well aerated. Mastoid air cells are unremarkable. Other: None. IMPRESSION: No acute intracranial process. Electronically Signed   By: Annia Belt M.D.   On: 02/17/2022 15:17   CT Hip Left Wo Contrast  Result Date: 02/17/2022 CLINICAL DATA:  Left hip fracture EXAM: CT OF THE LEFT HIP WITHOUT CONTRAST TECHNIQUE: Multidetector CT imaging of the left hip was performed according to the standard protocol. Multiplanar CT image reconstructions were also generated. RADIATION DOSE REDUCTION: This exam was performed according to the departmental dose-optimization program which includes automated exposure control, adjustment of the mA and/or kV according to patient size and/or use of iterative reconstruction technique. COMPARISON:  X-ray 02/17/2022 FINDINGS:  Bones/Joint/Cartilage Subacute comminuted intratrochanteric fracture of the proximal left femur with varus angulation and anterior displacement. There is developing callus formation across the fracture site although no bridging bone formation at this time. Femoral head remains aligned within the acetabulum without dislocation. Visualized portion of the left hemipelvis is intact. Left SI joint and pubic symphysis are intact without diastasis. Ligaments Suboptimally assessed by CT. Muscles and Tendons Fluid tracking along the course of the left iliopsoas tendon which could be related to posttraumatic hematoma or bursitis. Soft tissues Diffuse soft tissue edema. Presacral fluid/edema. Moderately distended urinary bladder. Moderate-large amount of fluid within the right inguinal canal. Partially visualized bilateral hydroceles. IMPRESSION: 1. Subacute comminuted intratrochanteric fracture of the proximal left femur, as described. 2. Fluid tracking along the course of the left iliopsoas tendon which could be related to posttraumatic hematoma or bursitis. 3. Moderate-large amount of fluid within the right inguinal canal. 4. Moderate distension of the urinary bladder. Correlate for retention or outlet obstruction. 5. Presacral fluid/edema, nonspecific. Electronically Signed  By: Duanne Guess D.O.   On: 02/17/2022 13:07   DG Knee Complete 4 Views Left  Result Date: 02/17/2022 CLINICAL DATA:  Fall, did not have a walker, esp on 4 times since the end of last week, LEFT knee LEFT hip pain EXAM: LEFT KNEE - COMPLETE 4+ VIEW COMPARISON:  None FINDINGS: Osseous demineralization. Soft tissue swelling LEFT knee region. Minimal joint space narrowing medial compartment and patellofemoral joint. No acute fracture, dislocation, or bone destruction. No joint effusion. Mature periosteal new bone versus callus at margin of exam at LEFT fibular diaphysis, question old fracture; incompletely assessed. IMPRESSION: Osseous  demineralization and minimal degenerative changes LEFT knee. No acute osseous abnormalities. Question healing fracture of mid LEFT fibular diaphysis; recommend correlation with patient history and exclusion of pain this site to exclude acute injury, and consider dedicated tibial/fibular radiographs if symptomatic at this site. Electronically Signed   By: Ulyses Southward M.D.   On: 02/17/2022 12:24   DG Hip Unilat W or Wo Pelvis 2-3 Views Left  Result Date: 02/17/2022 CLINICAL DATA:  Pain, fall EXAM: DG HIP (WITH OR WITHOUT PELVIS) 2-3V LEFT COMPARISON:  Pelvic radiograph 06/17/2019 FINDINGS: Osseous demineralization. Comminuted displaced intertrochanteric fracture LEFT femur with slight varus angulation. Minimal callus noted. No dislocation. Bony pelvis intact. Post ORIF of proximal RIGHT femur. IMPRESSION: Comminuted displaced intertrochanteric fracture LEFT femur with slight varus angulation. Minimal callus is seen, suggesting this is subacute. Osseous demineralization with prior ORIF proximal RIGHT femur. Electronically Signed   By: Ulyses Southward M.D.   On: 02/17/2022 12:26    Positive ROS: All other systems have been reviewed and were otherwise negative with the exception of those mentioned in the HPI and as above.  Physical Exam: General: Alert, no acute distress Cardiovascular: No pedal edema Respiratory: No cyanosis, no use of accessory musculature GI: No organomegaly, abdomen is soft and non-tender Skin: No lesions in the area of chief complaint Neurologic: Sensation intact distally Psychiatric: Patient is competent for consent with normal mood and affect Lymphatic: No axillary or cervical lymphadenopathy  MUSCULOSKELETAL: left hip short, externally rotated. Compartments soft. Good cap refill. Motor and sensory intact distally.  Assessment: Left hip intertrochanteric fracture, closed, displaced  Plan: Plan left hip nailing tomorrow when OR available. NPO after midnight.  The diagnosis,  risks, benefits and alternatives to treatment are all discussed in detail with the patient and family. Risks include but are not limited to bleeding, infection, deep vein thrombosis, pulmonary embolism, nerve or vascular injury, non-union, repeat operation, persistent pain, weakness, stiffness and death. Patient and his family understand and are eager to proceed.     Lyndle Herrlich, MD    02/17/2022 4:35 PM

## 2022-02-17 NOTE — ED Provider Notes (Signed)
[facility] Provider Note    Event Date/Time   First MD Initiated Contact with Patient 02/17/22 1102     (approximate)   History   Fall   HPI  Andres [name] is a 56 y.o. male   presents to the ED via EMS from a group home.  Patient states that he is fallen 4 times in the past week.  He complains of his left hip, left knee and ankle pain.  He initially fell on his left knee but is continue to ambulate with the use of his walker.  Today he reports that he fell on his left hip.  He denies any head injury or loss of consciousness during any of his falls.  Patient does not take blood thinners.  Sister is present and states that he had a fall last week in which another sibling took him to [facility] where his left knee was x-rayed and was negative for fracture.  Per sister he had a fractured right hip and was repaired in 2020.  Patient has history of hypertension, schizoaffective disorder, developmental delay, hypothyroidism, tardive dyskinesia, drug-induced parkinsonism, anxiety disorder, and chronic venous insufficiency.      Physical Exam   Triage Vital Signs: ED Triage Vitals  Enc Vitals Group     BP      Pulse      Resp      Temp      Temp src      SpO2      Weight      Height      Head Circumference      Peak Flow      Pain Score      Pain Loc      Pain Edu?      Excl. in GC?     Most recent vital signs: Vitals:   02/17/22 1102 02/17/22 1426  BP: (!) 136/94 110/76  Pulse: 64 72  Resp:  18  Temp: 98 F (36.7 C) (!) 97.5 F (36.4 C)  SpO2: 100% 100%     General: Awake, no distress.  Alert, very talkative. CV:  Good peripheral perfusion.  Heart regular rate and rhythm. Resp:  Normal effort.  Lungs are clear bilaterally. Abd:  No distention.  Soft, nontender, bowel sounds present. Other:  On examination of left hip there is no gross deformity however there is soft tissue edema and ecchymosis in various stages both medially and  anteriorly.  Skin is intact.  There is shortening of the left leg with rotation.  Decreased range of motion secondary to pain.  Motor sensory function intact distally.  Patient has skin consistent with his venous insufficiency bilateral lower extremities.  No ulcers or skin breakdown in this area.   ED Results / Procedures / Treatments   Labs (all labs ordered are listed, but only abnormal results are displayed) Labs Reviewed  COMPREHENSIVE METABOLIC PANEL - Abnormal; Notable for the following components:      Result Value   Chloride 112 (*)    CO2 21 (*)    Glucose, Bld 108 (*)    BUN 28 (*)    Calcium 8.4 (*)    Total Protein 5.3 (*)    Albumin 3.1 (*)    All other components within normal limits  CBC WITH DIFFERENTIAL/PLATELET - Abnormal; Notable for the following components:   RBC 3.03 (*)    Hemoglobin 9.4 (*)    HCT 27.9 (*)    nRBC  0.4 (*)    All other components within normal limits  URINALYSIS, ROUTINE W REFLEX MICROSCOPIC - Abnormal; Notable for the following components:   Color, Urine YELLOW (*)    APPearance CLEAR (*)    Bacteria, UA RARE (*)    All other components within normal limits  RESP PANEL BY RT-PCR (FLU A&B, COVID) ARPGX2  TYPE AND SCREEN     EKG  pending   RADIOLOGY Left knee x-ray reviewed by me no acute fracture and radiology report was reviewed.  Degenerative changes noted. Left hip x-ray with pelvis reviewed by me and shows fracture left hip with repair of right hip.  Radiology report was read and comminuted displaced trochanteric fracture with slight angulation. CT scan left hip without contrast to confirm. Chest x-ray no acute cardiopulmonary changes. CT head per radiology is negative for any acute intracranial changes.  PROCEDURES:  Critical Care performed:   Procedures   MEDICATIONS ORDERED IN ED: Medications - No data to display   IMPRESSION / MDM / ASSESSMENT AND PLAN / ED COURSE  I reviewed the triage vital signs and the  nursing notes.   Differential diagnosis includes, but is not limited to, fracture left hip, contusion left knee versus fracture, history of falls witnessed versus unwitnessed.  56 year old male is brought to the ED via EMS with complaint of left hip pain and multiple falls this week.  Family states that he was seen at [facility] after 1 fall where his knee was x-rayed and family was told that there was no fracture.  Today he had another fall and complained of his left hip hurting.  Patient was noted to have some bruising in various stages of healing to his upper thigh and shortening of the left extremity with rotation.  Patient is very talkative and cooperative.  A CT scan of his head was also ordered as some of his falls may have been unwitnessed.  CT scan of the left hip shows a comminuted intertrochanteric fracture.  I contacted Dr. Odis Luster who is the orthopedist on-call who actually did surgery on this patient for his right hip fracture in 2020.  He is to be admitted to the hospital and Dr. Odis Luster will do surgery tomorrow.  Dr. Clyde Lundborg is the admitting doctor and will be seeing him in the emergency department.   FINAL CLINICAL IMPRESSION(S) / ED DIAGNOSES   Final diagnoses:  Closed fracture of left hip, initial encounter [facility])     Rx / DC Orders   ED Discharge Orders     None        Note:  This document was prepared using Dragon voice recognition software and may include unintentional dictation errors.   Tommi Rumps, PA-C 02/17/22 1530    Georga Hacking, MD 02/17/22 [phone]

## 2022-02-17 NOTE — H&P (Signed)
History and Physical    Andres [name] [id number] DOB: 1966-07-23 DOA: 02/17/2022  Referring MD/NP/PA:   PCP: Olin Hauser, DO   Patient coming from:  The patient is coming from group home  Chief Complaint: fall and left hip pain  HPI: Andres [name] is a 56 y.o. male with medical history significant of hypertension, hypothyroidism, gout, anxiety, schizophrenia, developmental delay, tardive dyskinesia, dCHF, who presents with fall and left hip pain.  Per his sister (I called his sister by phone), pt fell at least twice in the last week. Last fall was on Monday. He has pain in his hip and left knee. He states that his left hip is severe, constant, sharp, nonradiating.  Patient denies any head or neck injury.  No loss of conscious.  No chest pain, cough, shortness breath.  Denies nausea vomiting, diarrhea or abdominal pain.  No symptoms of UTI.  Denies any dark stool or rectal bleeding.  Data Reviewed and ED Course: pt was found to have WBC 5.0, pending COVID PCR, electrolytes renal function okay, temperature normal, blood pressure 110/76, heart rate 72, RR 18, oxygen saturation 100% on room air.  Chest x-ray negative.  CT of head is negative for acute intracranial abnormalities.  X-ray of left knee is negative for bony fracture.  X-ray of left hip and CT scan of left hip showed intertrochanteric fracture of left proximal femur.  Patient is admitted to [facility] bed as inpatient.  Dr. Harlow Mares of Ortho is consulted.   CT-left hip 1. Subacute comminuted intratrochanteric fracture of the proximal left femur, as described. 2. Fluid tracking along the course of the left iliopsoas tendon which could be related to posttraumatic hematoma or bursitis. 3. Moderate-large amount of fluid within the right inguinal canal. 4. Moderate distension of the urinary bladder. Correlate for retention or outlet obstruction. 5. Presacral fluid/edema, nonspecific.  EKG:  Not done in ED, will get one.       Review of Systems:   General: no fevers, chills, no body weight gain, has fatigue HEENT: no blurry vision, hearing changes or sore throat Respiratory: no dyspnea, coughing, wheezing CV: no chest pain, no palpitations GI: no nausea, vomiting, abdominal pain, diarrhea, constipation GU: no dysuria, burning on urination, increased urinary frequency, hematuria  Ext: has leg edema Neuro: no unilateral weakness, numbness, or tingling, no vision change or hearing loss. Has fall Skin: no rash, no skin tear. MSK: has left hip and knee pain Heme: No easy bruising.  Travel history: No recent long distant travel.   Allergy:  Allergies  Allergen Reactions   Prednisone Other (See Comments)    Did not like the way it made him feel  Did not like the way it made him feel    Lamotrigine Rash    Past Medical History:  Diagnosis Date   Allergy     Past Surgical History:  Procedure Laterality Date   ANKLE SURGERY     COLON SURGERY     HERNIA REPAIR     INTRAMEDULLARY (IM) NAIL INTERTROCHANTERIC Right 06/18/2019   Procedure: INTRAMEDULLARY (IM) NAIL INTERTROCHANTRIC;  Surgeon: Lovell Sheehan, MD;  Location: [facility] ORS;  Service: Orthopedics;  Laterality: Right;   TONSILLECTOMY      Social History:  reports that he has never smoked. He has never used smokeless tobacco. He reports that he does not drink alcohol and does not use drugs.  Family History:  Family History  Problem Relation Age of Onset   Diabetes Mother  Stroke Mother    Cancer Father        colon   Colon cancer Father    Colon cancer Other    Mental illness Neg Hx      Prior to Admission medications   Medication Sig Start Date End Date Taking? Authorizing Provider  allopurinol (ZYLOPRIM) 100 MG tablet TAKE 1 TABLET(100 MG) BY MOUTH DAILY 06/08/21   Parks Ranger, Devonne Doughty, DO  amoxicillin (AMOXIL) 500 MG tablet Take 1,000 mg by mouth 2 (two) times daily. 12/29/21   [provider]  azelastine (ASTELIN) 0.1 %  nasal spray Place into both nostrils. 12/29/21   [provider]  cholecalciferol (VITAMIN D) 1000 units tablet Take 1,000 Units by mouth daily.    [provider]  citalopram (CELEXA) 40 MG tablet TAKE 1 TABLET(40 MG) BY MOUTH DAILY 06/08/21   Ursula Alert, MD  fexofenadine (ALLEGRA) 180 MG tablet Take 180 mg by mouth.    [provider]  ibuprofen (ADVIL) 200 MG tablet Take 200 mg by mouth 2 (two) times daily with a meal.    [provider]  levothyroxine (SYNTHROID) 25 MCG tablet TAKE 1 TABLET BY MOUTH DAILY BEFORE BREAKFAST ON AN EMPTY STOMACH( NOT TO TAKE WITH OTHER MEDICINES) 06/08/21   Olin Hauser, DO  ondansetron (ZOFRAN) 4 MG tablet Take by mouth. 12/30/21   [provider]  propranolol (INDERAL) 10 MG tablet Take 1 tablet (10 mg total) by mouth 2 (two) times daily. For severe anxiety/agitation 10/27/21   Olin Hauser, DO  QUEtiapine (SEROQUEL) 25 MG tablet Take 3 tablets (75 mg total) by mouth at bedtime. 01/05/22   Ursula Alert, MD  thiamine (VITAMIN B-1) 100 MG tablet Take 100 mg by mouth daily.    [provider]  traZODone (DESYREL) 50 MG tablet Take 1 tablet (50 mg total) by mouth at bedtime. Dose change 01/05/22   Ursula Alert, MD  valbenazine [facility]) 80 MG capsule Take 1 capsule (80 mg total) by mouth at bedtime. 01/05/22   Ursula Alert, MD    Physical Exam: Vitals:   02/17/22 1800 02/17/22 1830 02/17/22 1900 02/17/22 1930  BP: (!) 143/83 136/83 (!) 142/87 (!) 148/101  Pulse: 71 74 69 75  Resp:      Temp:      TempSrc:      SpO2: 100% 100% 100% 100%   General: Not in acute distress HEENT:       Eyes: PERRL, EOMI, no scleral icterus.       ENT: No discharge from the ears and nose, no pharynx injection, no tonsillar enlargement.        Neck: No JVD, no bruit, no mass felt. Heme: No neck lymph node enlargement. Cardiac: S1/S2, RRR, No murmurs, No gallops or rubs. Respiratory: No rales,  wheezing, rhonchi or rubs. GI: Soft, nondistended, nontender, no rebound pain, no organomegaly, BS present. GU: No hematuria Ext: has 2+ left leg edema and 1+ right leg edema. 1+DP/PT pulse bilaterally. Musculoskeletal: has tenderness in left hip Skin: No rashes.  Neuro: Alert, oriented X3, cranial nerves II-XII grossly intact, moves all extremities. Psych: Patient is not psychotic, no suicidal or hemocidal ideation.  Labs on Admission: I have personally reviewed following labs and imaging studies  CBC: Recent Labs  Lab 02/17/22 1230  WBC 5.0  NEUTROABS 3.2  HGB 9.4*  HCT 27.9*  MCV 92.1  PLT 0000000   Basic Metabolic Panel: Recent Labs  Lab 02/17/22 1230  NA 141  K  3.9  CL 112*  CO2 21*  GLUCOSE 108*  BUN 28*  CREATININE 0.90  CALCIUM 8.4*   GFR: CrCl cannot be calculated (Unknown ideal weight.). Liver Function Tests: Recent Labs  Lab 02/17/22 1230  AST 24  ALT 16  ALKPHOS 46  BILITOT 1.2  PROT 5.3*  ALBUMIN 3.1*   No results for input(s): LIPASE, AMYLASE in the last 168 hours. No results for input(s): AMMONIA in the last 168 hours. Coagulation Profile: Recent Labs  Lab 02/17/22 1233  INR 1.0   Cardiac Enzymes: No results for input(s): CKTOTAL, CKMB, CKMBINDEX, TROPONINI in the last 168 hours. BNP (last 3 results) No results for input(s): PROBNP in the last 8760 hours. HbA1C: No results for input(s): HGBA1C in the last 72 hours. CBG: No results for input(s): GLUCAP in the last 168 hours. Lipid Profile: No results for input(s): CHOL, HDL, LDLCALC, TRIG, CHOLHDL, LDLDIRECT in the last 72 hours. Thyroid Function Tests: No results for input(s): TSH, T4TOTAL, FREET4, T3FREE, THYROIDAB in the last 72 hours. Anemia Panel: No results for input(s): VITAMINB12, FOLATE, FERRITIN, TIBC, IRON, RETICCTPCT in the last 72 hours. Urine analysis:    Component Value Date/Time   COLORURINE YELLOW (A) 02/17/2022 1230   APPEARANCEUR CLEAR (A) 02/17/2022 1230    LABSPEC 1.012 02/17/2022 1230   PHURINE 6.0 02/17/2022 1230   GLUCOSEU NEGATIVE 02/17/2022 1230   HGBUR NEGATIVE 02/17/2022 1230   BILIRUBINUR NEGATIVE 02/17/2022 1230   KETONESUR NEGATIVE 02/17/2022 1230   PROTEINUR NEGATIVE 02/17/2022 1230   NITRITE NEGATIVE 02/17/2022 1230   LEUKOCYTESUR NEGATIVE 02/17/2022 1230   Sepsis Labs: @LABRCNTIP (procalcitonin:4,lacticidven:4) ) Recent Results (from the past 240 hour(s))  Resp Panel by RT-PCR (Flu A&B, Covid) Nasopharyngeal Swab     Status: None   Collection Time: 02/17/22  6:03 PM   Specimen: Nasopharyngeal Swab; Nasopharyngeal(NP) swabs in vial transport medium  Result Value Ref Range Status   SARS Coronavirus 2 by RT PCR NEGATIVE NEGATIVE Final    Comment: (NOTE) SARS-CoV-2 target nucleic acids are NOT DETECTED.  The SARS-CoV-2 RNA is generally detectable in upper respiratory specimens during the acute phase of infection. The lowest concentration of SARS-CoV-2 viral copies this assay can detect is 138 copies/mL. A negative result does not preclude SARS-Cov-2 infection and should not be used as the sole basis for treatment or other patient management decisions. A negative result may occur with  improper specimen collection/handling, submission of specimen other than nasopharyngeal swab, presence of viral mutation(s) within the areas targeted by this assay, and inadequate number of viral copies(<138 copies/mL). A negative result must be combined with clinical observations, patient history, and epidemiological information. The expected result is Negative.  Fact Sheet for Patients:  EntrepreneurPulse.com.au  Fact Sheet for Healthcare Providers:  IncredibleEmployment.be  This test is no t yet approved or cleared by the [place] FDA and  has been authorized for detection and/or diagnosis of SARS-CoV-2 by FDA under an Emergency Use Authorization (EUA). This EUA will remain  in effect (meaning  this test can be used) for the duration of the COVID-19 declaration under Section 564(b)(1) of the Act, 21 U.S.C.section 360bbb-3(b)(1), unless the authorization is terminated  or revoked sooner.       Influenza A by PCR NEGATIVE NEGATIVE Final   Influenza B by PCR NEGATIVE NEGATIVE Final    Comment: (NOTE) The Xpert Xpress SARS-CoV-2/FLU/RSV plus assay is intended as an aid in the diagnosis of influenza from Nasopharyngeal swab specimens and should not be used as a sole  basis for treatment. Nasal washings and aspirates are unacceptable for Xpert Xpress SARS-CoV-2/FLU/RSV testing.  Fact Sheet for Patients: EntrepreneurPulse.com.au  Fact Sheet for Healthcare Providers: IncredibleEmployment.be  This test is not yet approved or cleared by the [place] FDA and has been authorized for detection and/or diagnosis of SARS-CoV-2 by FDA under an Emergency Use Authorization (EUA). This EUA will remain in effect (meaning this test can be used) for the duration of the COVID-19 declaration under Section 564(b)(1) of the Act, 21 U.S.C. section 360bbb-3(b)(1), unless the authorization is terminated or revoked.  Performed at [facility], [address] [place], [place] [address]      Radiological Exams on Admission: DG Chest 1 View  Result Date: 02/17/2022 CLINICAL DATA:  LEFT hip and knee pain post fall EXAM: CHEST  1 VIEW COMPARISON:  06/17/2019 FINDINGS: Normal heart size, mediastinal contours, and pulmonary vascularity. Lungs clear. No infiltrate, pleural effusion, or pneumothorax. Bones demineralized. IMPRESSION: No acute abnormalities. Electronically Signed   By: Lavonia Dana M.D.   On: 02/17/2022 12:27   CT Head Wo Contrast  Result Date: 02/17/2022 CLINICAL DATA:  Patient with fall. EXAM: CT HEAD WITHOUT CONTRAST TECHNIQUE: Contiguous axial images were obtained from the base of the skull through the vertex without intravenous  contrast. RADIATION DOSE REDUCTION: This exam was performed according to the departmental dose-optimization program which includes automated exposure control, adjustment of the mA and/or kV according to patient size and/or use of iterative reconstruction technique. COMPARISON:  Brain CT 01/20/2022. FINDINGS: Brain: Ventricles and sulci are appropriate for patient's age. No evidence for acute cortical based infarct, intracranial hemorrhage, mass lesion or mass effect. Vascular: Unremarkable Skull: Intact. Sinuses/Orbits: Paranasal sinuses are well aerated. Mastoid air cells are unremarkable. Other: None. IMPRESSION: No acute intracranial process. Electronically Signed   By: Lovey Newcomer M.D.   On: 02/17/2022 15:17   CT Hip Left Wo Contrast  Result Date: 02/17/2022 CLINICAL DATA:  Left hip fracture EXAM: CT OF THE LEFT HIP WITHOUT CONTRAST TECHNIQUE: Multidetector CT imaging of the left hip was performed according to the standard protocol. Multiplanar CT image reconstructions were also generated. RADIATION DOSE REDUCTION: This exam was performed according to the departmental dose-optimization program which includes automated exposure control, adjustment of the mA and/or kV according to patient size and/or use of iterative reconstruction technique. COMPARISON:  X-ray 02/17/2022 FINDINGS: Bones/Joint/Cartilage Subacute comminuted intratrochanteric fracture of the proximal left femur with varus angulation and anterior displacement. There is developing callus formation across the fracture site although no bridging bone formation at this time. Femoral head remains aligned within the acetabulum without dislocation. Visualized portion of the left hemipelvis is intact. Left SI joint and pubic symphysis are intact without diastasis. Ligaments Suboptimally assessed by CT. Muscles and Tendons Fluid tracking along the course of the left iliopsoas tendon which could be related to posttraumatic hematoma or bursitis. Soft tissues  Diffuse soft tissue edema. Presacral fluid/edema. Moderately distended urinary bladder. Moderate-large amount of fluid within the right inguinal canal. Partially visualized bilateral hydroceles. IMPRESSION: 1. Subacute comminuted intratrochanteric fracture of the proximal left femur, as described. 2. Fluid tracking along the course of the left iliopsoas tendon which could be related to posttraumatic hematoma or bursitis. 3. Moderate-large amount of fluid within the right inguinal canal. 4. Moderate distension of the urinary bladder. Correlate for retention or outlet obstruction. 5. Presacral fluid/edema, nonspecific. Electronically Signed   By: Davina Poke D.O.   On: 02/17/2022 13:07   DG Knee Complete 4 Views Left  Result Date: 02/17/2022 CLINICAL DATA:  Fall, did not have a walker, esp on 4 times since the end of last week, LEFT knee LEFT hip pain EXAM: LEFT KNEE - COMPLETE 4+ VIEW COMPARISON:  None FINDINGS: Osseous demineralization. Soft tissue swelling LEFT knee region. Minimal joint space narrowing medial compartment and patellofemoral joint. No acute fracture, dislocation, or bone destruction. No joint effusion. Mature periosteal new bone versus callus at margin of exam at LEFT fibular diaphysis, question old fracture; incompletely assessed. IMPRESSION: Osseous demineralization and minimal degenerative changes LEFT knee. No acute osseous abnormalities. Question healing fracture of mid LEFT fibular diaphysis; recommend correlation with patient history and exclusion of pain this site to exclude acute injury, and consider dedicated tibial/fibular radiographs if symptomatic at this site. Electronically Signed   By: Lavonia Dana M.D.   On: 02/17/2022 12:24   DG Hip Unilat W or Wo Pelvis 2-3 Views Left  Result Date: 02/17/2022 CLINICAL DATA:  Pain, fall EXAM: DG HIP (WITH OR WITHOUT PELVIS) 2-3V LEFT COMPARISON:  Pelvic radiograph 06/17/2019 FINDINGS: Osseous demineralization. Comminuted displaced  intertrochanteric fracture LEFT femur with slight varus angulation. Minimal callus noted. No dislocation. Bony pelvis intact. Post ORIF of proximal RIGHT femur. IMPRESSION: Comminuted displaced intertrochanteric fracture LEFT femur with slight varus angulation. Minimal callus is seen, suggesting this is subacute. Osseous demineralization with prior ORIF proximal RIGHT femur. Electronically Signed   By: Lavonia Dana M.D.   On: 02/17/2022 12:26      Assessment/Plan Principal Problem:   Closed left hip fracture [facility]) Active Problems:   Fall   Essential hypertension   Hypothyroidism   Schizoaffective disorder, depressive type ([place])   Tardive dyskinesia   Urine retention   Gout   Normocytic anemia   Chronic diastolic CHF (congestive heart failure) (H[facility])   Closed left hip fracture [facility]): image showed subacute comminuted intratrochanteric fracture of the proximal left femur.  Patient has moderate pain now. No neurovascular compromise. Orthopedic surgeon, Dr. Harlow Mares was consulted.   - will admit to Med-surg bed - Pain control: morphine prn and percocet - When necessary Zofran for nausea - Robaxin for muscle spasm - type and cross - INR/PTT -PT/OT when able to (not ordered now)  Fall: -pt/ot when able to  Essential hypertension -IV hydralazine as needed -Propranolol  Hypothyroidism -Synthroid  Schizoaffective disorder, depressive type ([place]) -Celexa, Seroquel  Tardive dyskinesia -Ingrezza  Urine retention: Patient has acute urinary retention -Foley catheter placed with  Gout -Allopurinol  Normocytic anemia: Hemoglobin 9.4 (14.3 on 01/20/2022).  Patient denies dark stool or rectal bleeding.  May be due to bone fracture -Follow-up with CBC  Chronic diastolic CHF (congestive heart failure) ([place]): 2D echo on 10/03/2015 showed EF > 55%.  Patient is not aware of this diagnosis.  Patient is not taking diuretics.  Patient has asymmetric leg edema, but no shortness of breath.  Chest  x-ray does not have pulmonary edema.  Patient does not seem to have CHF exacerbation. -Check BMP -Follow-up lower extremity venous Doppler to rule out DVT due to asymmetric leg edema.   Perioperative Cardiac Risk: pt has multiple comorbidities, as listed above, including dCHF with EF > 55%. Currently patient is active. No recent acute cardiac issues.  Patient does not have chest pain, shortness of breath, palpitation. He has asymmetric leg edema, will need to rule out DVT.  No signs of acute CHF exacerbation. At this time point, no further work up is needed. Patient's GUPTA score perioperative myocardial infarction or cardaic arrest is 0.99 %.  I discussed the risk with patient and her sister, they would like to proceed for surgery.    DVT ppx: SCD  Code Status: DNR per his sister who is POA  Family Communication: Yes, patient's sister  by phone  Disposition Plan:  to be determined  Consults called:  Dr. Harlow Mares of ortho Admission status and Level of care: Med-Surg:  as inpt      Severity of Illness:  The appropriate patient status for this patient is INPATIENT. Inpatient status is judged to be reasonable and necessary in order to provide the required intensity of service to ensure the patient's safety. The patient's presenting symptoms, physical exam findings, and initial radiographic and laboratory data in the context of their chronic comorbidities is felt to place them at high risk for further clinical deterioration. Furthermore, it is not anticipated that the patient will be medically stable for discharge from the hospital within 2 midnights of admission.   * I certify that at the point of admission it is my clinical judgment that the patient will require inpatient hospital care spanning beyond 2 midnights from the point of admission due to high intensity of service, high risk for further deterioration and high frequency of surveillance required.*       Date of Service 02/17/2022     Ivor Costa [facility]   If 7PM-7AM, please contact night-coverage [email or web address] 02/17/2022, 7:59 PM

## 2022-02-17 NOTE — ED Notes (Addendum)
Bladder scan volume indicates >951mL. Bjorn Loser PA, notified of results.

## 2022-02-17 NOTE — ED Triage Notes (Signed)
Pt BIB by EMS from group home.  States he didn't have a walker there  Has fallen 4 times since end of last week.  Complaints of left knee, hip, and ankle pain since fall. No blood thinners.

## 2022-02-18 ENCOUNTER — Other Ambulatory Visit: Payer: Self-pay

## 2022-02-18 ENCOUNTER — Inpatient Hospital Stay: Payer: Medicare Other

## 2022-02-18 ENCOUNTER — Inpatient Hospital Stay: Payer: Medicare Other | Admitting: Anesthesiology

## 2022-02-18 ENCOUNTER — Encounter
Admission: EM | Disposition: A | Discharge status: Skilled Nursing Facility | Payer: Self-pay | Source: Home / Self Care | Attending: Internal Medicine

## 2022-02-18 DIAGNOSIS — I5032 Chronic diastolic (congestive) heart failure: Secondary | ICD-10-CM

## 2022-02-18 HISTORY — PX: INTRAMEDULLARY (IM) NAIL INTERTROCHANTERIC: SHX5875

## 2022-02-18 LAB — CBC
HCT: 27.2 % — ABNORMAL LOW (ref 39.0–52.0)
Hemoglobin: 9.1 g/dL — ABNORMAL LOW (ref 13.0–17.0)
MCH: 30.8 pg (ref 26.0–34.0)
MCHC: 33.5 g/dL (ref 30.0–36.0)
MCV: 92.2 fL (ref 80.0–100.0)
Platelets: 152 10*3/uL (ref 150–400)
RBC: 2.95 MIL/uL — ABNORMAL LOW (ref 4.22–5.81)
RDW: 13.8 % (ref 11.5–15.5)
WBC: 5 10*3/uL (ref 4.0–10.5)
nRBC: 0 % (ref 0.0–0.2)

## 2022-02-18 LAB — CREATININE, SERUM
Creatinine, Ser: 0.92 mg/dL (ref 0.61–1.24)
GFR, Estimated: >60 mL/min (ref >60)

## 2022-02-18 LAB — GLUCOSE, CAPILLARY: Glucose-Capillary: 97 mg/dL (ref 70–99)

## 2022-02-18 LAB — HIV ANTIBODY (ROUTINE TESTING W REFLEX): HIV Screen 4th Generation wRfx: NONREACTIVE

## 2022-02-18 SURGERY — FIXATION, FRACTURE, INTERTROCHANTERIC, WITH INTRAMEDULLARY ROD
Anesthesia: General | Laterality: Left

## 2022-02-18 MED ORDER — MIDAZOLAM HCL 2 MG/2ML IJ SOLN
INTRAMUSCULAR | Status: DC | PRN
Start: 2022-02-18 — End: 2022-02-18
  Administered 2022-02-18: 2 mg via INTRAVENOUS

## 2022-02-18 MED ORDER — DOCUSATE SODIUM 100 MG PO CAPS
100.0000 mg | ORAL_CAPSULE | Freq: Two times a day (BID) | ORAL | Status: DC
  Administered 2022-02-18 – 2022-02-22 (×4): 100 mg via ORAL
  Filled 2022-02-18 (×7): qty 1

## 2022-02-18 MED ORDER — FENTANYL CITRATE (PF) 100 MCG/2ML IJ SOLN
INTRAMUSCULAR | Status: AC
  Filled 2022-02-18: qty 2

## 2022-02-18 MED ORDER — ONDANSETRON HCL 4 MG PO TABS: 4.0000 mg | ORAL_TABLET | Freq: Four times a day (QID) | ORAL | Status: DC | PRN

## 2022-02-18 MED ORDER — TRANEXAMIC ACID-NACL 1000-0.7 MG/100ML-% IV SOLN
1000.0000 mg | INTRAVENOUS | Status: AC
  Administered 2022-02-18: 1000 mg via INTRAVENOUS

## 2022-02-18 MED ORDER — ROCURONIUM BROMIDE 100 MG/10ML IV SOLN
INTRAVENOUS | Status: DC | PRN
  Administered 2022-02-18: 50 mg via INTRAVENOUS
  Administered 2022-02-18: 25 mg via INTRAVENOUS

## 2022-02-18 MED ORDER — ONDANSETRON HCL 4 MG/2ML IJ SOLN
INTRAMUSCULAR | Status: DC | PRN
  Administered 2022-02-18: 4 mg via INTRAVENOUS

## 2022-02-18 MED ORDER — ACETAMINOPHEN 10 MG/ML IV SOLN
INTRAVENOUS | Status: DC | PRN
  Administered 2022-02-18: 1000 mg via INTRAVENOUS

## 2022-02-18 MED ORDER — ROCURONIUM BROMIDE 10 MG/ML (PF) SYRINGE
PREFILLED_SYRINGE | INTRAVENOUS | Status: AC
  Filled 2022-02-18: qty 10

## 2022-02-18 MED ORDER — PROPOFOL 10 MG/ML IV BOLUS
INTRAVENOUS | Status: DC | PRN
  Administered 2022-02-18: 70 mg via INTRAVENOUS

## 2022-02-18 MED ORDER — LACTATED RINGERS IV SOLN: INTRAVENOUS | Status: DC | PRN

## 2022-02-18 MED ORDER — FENTANYL CITRATE (PF) 100 MCG/2ML IJ SOLN: 25.0000 ug | INTRAMUSCULAR | Status: DC | PRN

## 2022-02-18 MED ORDER — MIDAZOLAM HCL 2 MG/2ML IJ SOLN
2.0000 mg | Freq: Once | INTRAMUSCULAR | Status: AC
  Administered 2022-02-18: 2 mg via INTRAVENOUS

## 2022-02-18 MED ORDER — ADULT MULTIVITAMIN W/MINERALS CH
1.0000 | ORAL_TABLET | Freq: Every day | ORAL | Status: DC
  Administered 2022-02-19 – 2022-02-23 (×5): 1 via ORAL
  Filled 2022-02-18 (×6): qty 1

## 2022-02-18 MED ORDER — CHLORHEXIDINE GLUCONATE CLOTH 2 % EX PADS
6.0000 | MEDICATED_PAD | Freq: Every day | CUTANEOUS | Status: DC
  Administered 2022-02-19 – 2022-02-20 (×2): 6 via TOPICAL

## 2022-02-18 MED ORDER — ENOXAPARIN SODIUM 40 MG/0.4ML IJ SOSY
40.0000 mg | PREFILLED_SYRINGE | INTRAMUSCULAR | Status: DC
  Administered 2022-02-19 – 2022-02-23 (×5): 40 mg via SUBCUTANEOUS
  Filled 2022-02-18 (×5): qty 0.4

## 2022-02-18 MED ORDER — CEFAZOLIN SODIUM-DEXTROSE 1-4 GM/50ML-% IV SOLN
1.0000 g | Freq: Four times a day (QID) | INTRAVENOUS | Status: AC
  Administered 2022-02-18 – 2022-02-19 (×3): 1 g via INTRAVENOUS
  Filled 2022-02-18 (×3): qty 50

## 2022-02-18 MED ORDER — CEFAZOLIN SODIUM-DEXTROSE 2-4 GM/100ML-% IV SOLN
INTRAVENOUS | Status: AC
  Filled 2022-02-18: qty 100

## 2022-02-18 MED ORDER — DEXAMETHASONE SODIUM PHOSPHATE 10 MG/ML IJ SOLN
INTRAMUSCULAR | Status: DC | PRN
  Administered 2022-02-18: 10 mg via INTRAVENOUS

## 2022-02-18 MED ORDER — GLYCOPYRROLATE 0.2 MG/ML IJ SOLN
INTRAMUSCULAR | Status: AC
Start: 2022-02-18 — End: ?
  Filled 2022-02-18: qty 1

## 2022-02-18 MED ORDER — GLYCOPYRROLATE 0.2 MG/ML IJ SOLN
INTRAMUSCULAR | Status: DC | PRN
  Administered 2022-02-18: .2 mg via INTRAVENOUS

## 2022-02-18 MED ORDER — LIDOCAINE HCL (PF) 2 % IJ SOLN
INTRAMUSCULAR | Status: AC
  Filled 2022-02-18: qty 5

## 2022-02-18 MED ORDER — ONDANSETRON HCL 4 MG/2ML IJ SOLN
INTRAMUSCULAR | Status: AC
  Filled 2022-02-18: qty 2

## 2022-02-18 MED ORDER — BUPIVACAINE-EPINEPHRINE (PF) 0.25% -1:200000 IJ SOLN
INTRAMUSCULAR | Status: DC | PRN
  Administered 2022-02-18: 30 mL via PERINEURAL

## 2022-02-18 MED ORDER — METOCLOPRAMIDE HCL 5 MG/ML IJ SOLN: 5.0000 mg | Freq: Three times a day (TID) | INTRAMUSCULAR | Status: DC | PRN

## 2022-02-18 MED ORDER — LIDOCAINE HCL (CARDIAC) PF 100 MG/5ML IV SOSY
PREFILLED_SYRINGE | INTRAVENOUS | Status: DC | PRN
Start: 2022-02-18 — End: 2022-02-18
  Administered 2022-02-18: 100 mg via INTRAVENOUS

## 2022-02-18 MED ORDER — ENSURE ENLIVE PO LIQD
237.0000 mL | Freq: Two times a day (BID) | ORAL | Status: DC
  Administered 2022-02-19 – 2022-02-23 (×8): 237 mL via ORAL

## 2022-02-18 MED ORDER — MIDAZOLAM HCL 2 MG/2ML IJ SOLN
INTRAMUSCULAR | Status: AC
  Filled 2022-02-18: qty 2

## 2022-02-18 MED ORDER — TRANEXAMIC ACID-NACL 1000-0.7 MG/100ML-% IV SOLN
INTRAVENOUS | Status: AC
  Filled 2022-02-18: qty 100

## 2022-02-18 MED ORDER — TRANEXAMIC ACID 1000 MG/10ML IV SOLN
INTRAVENOUS | Status: AC
  Filled 2022-02-18: qty 10

## 2022-02-18 MED ORDER — SUGAMMADEX SODIUM 200 MG/2ML IV SOLN
INTRAVENOUS | Status: DC | PRN
  Administered 2022-02-18: 200 mg via INTRAVENOUS

## 2022-02-18 MED ORDER — BUPIVACAINE-EPINEPHRINE (PF) 0.25% -1:200000 IJ SOLN
INTRAMUSCULAR | Status: AC
  Filled 2022-02-18: qty 30

## 2022-02-18 MED ORDER — PHENYLEPHRINE HCL (PRESSORS) 10 MG/ML IV SOLN
INTRAVENOUS | Status: DC | PRN
  Administered 2022-02-18 (×3): 160 ug via INTRAVENOUS

## 2022-02-18 MED ORDER — FENTANYL CITRATE (PF) 100 MCG/2ML IJ SOLN: INTRAMUSCULAR | Status: DC | PRN

## 2022-02-18 MED ORDER — METOCLOPRAMIDE HCL 10 MG PO TABS: 5.0000 mg | ORAL_TABLET | Freq: Three times a day (TID) | ORAL | Status: DC | PRN

## 2022-02-18 MED ORDER — PHENYLEPHRINE HCL-NACL 20-0.9 MG/250ML-% IV SOLN
INTRAVENOUS | Status: DC | PRN
  Administered 2022-02-18: 50 ug/min via INTRAVENOUS

## 2022-02-18 MED ORDER — SEVOFLURANE IN SOLN
RESPIRATORY_TRACT | Status: AC
  Filled 2022-02-18: qty 250

## 2022-02-18 MED ORDER — FENTANYL CITRATE (PF) 100 MCG/2ML IJ SOLN
INTRAMUSCULAR | Status: DC | PRN
  Administered 2022-02-18 (×2): 50 ug via INTRAVENOUS

## 2022-02-18 MED ORDER — CEFAZOLIN SODIUM-DEXTROSE 2-4 GM/100ML-% IV SOLN
2.0000 g | INTRAVENOUS | Status: AC
  Administered 2022-02-18: 2 g via INTRAVENOUS

## 2022-02-18 MED ORDER — ONDANSETRON HCL 4 MG/2ML IJ SOLN: 4.0000 mg | Freq: Once | INTRAMUSCULAR | Status: DC | PRN

## 2022-02-18 MED ORDER — ACETAMINOPHEN 10 MG/ML IV SOLN
INTRAVENOUS | Status: AC
  Filled 2022-02-18: qty 100

## 2022-02-18 MED ORDER — ONDANSETRON HCL 4 MG/2ML IJ SOLN: 4.0000 mg | Freq: Four times a day (QID) | INTRAMUSCULAR | Status: DC | PRN

## 2022-02-18 MED ORDER — 0.9 % SODIUM CHLORIDE (POUR BTL) OPTIME
TOPICAL | Status: DC | PRN
  Administered 2022-02-18: 200 mL

## 2022-02-18 MED ORDER — DEXAMETHASONE SODIUM PHOSPHATE 10 MG/ML IJ SOLN
INTRAMUSCULAR | Status: AC
  Filled 2022-02-18: qty 1

## 2022-02-18 SURGICAL SUPPLY — 39 items
BIT DRILL CANN 16 HIP (BIT) ×1 IMPLANT
BIT DRILL CANN STP 6/9 HIP (BIT) ×1 IMPLANT
BLADE HELICAL TFNA 90 (Anchor) ×1 IMPLANT
BNDG COHESIVE 4X5 TAN ST LF (GAUZE/BANDAGES/DRESSINGS) ×2 IMPLANT
BRUSH SCRUB EZ  4% CHG (MISCELLANEOUS) ×2
BRUSH SCRUB EZ 4% CHG (MISCELLANEOUS) ×2 IMPLANT
CHLORAPREP W/TINT 26 (MISCELLANEOUS) ×2 IMPLANT
DRAPE 3/4 80X56 (DRAPES) ×2 IMPLANT
DRAPE U-SHAPE 47X51 STRL (DRAPES) ×2 IMPLANT
DRSG AQUACEL AG 3.5X4 (GAUZE/BANDAGES/DRESSINGS) ×1 IMPLANT
DRSG AQUACEL AG ADV 3.5X10 (GAUZE/BANDAGES/DRESSINGS) ×3 IMPLANT
ELECT REM PT RETURN 9FT ADLT (ELECTROSURGICAL) ×2
ELECTRODE REM PT RTRN 9FT ADLT (ELECTROSURGICAL) ×1 IMPLANT
GAUZE XEROFORM 1X8 LF (GAUZE/BANDAGES/DRESSINGS) ×2 IMPLANT
GLOVE SURG ORTHO LTX SZ8.5 (GLOVE) ×2 IMPLANT
GLOVE SURG UNDER POLY LF SZ8.5 (GLOVE) ×2 IMPLANT
GOWN STRL REUS W/ TWL LRG LVL3 (GOWN DISPOSABLE) ×1 IMPLANT
GOWN STRL REUS W/ TWL XL LVL3 (GOWN DISPOSABLE) ×1 IMPLANT
GOWN STRL REUS W/TWL LRG LVL3 (GOWN DISPOSABLE) ×1
GOWN STRL REUS W/TWL XL LVL3 (GOWN DISPOSABLE) ×1
GUIDEWIRE 3.2X400 (WIRE) ×1 IMPLANT
KIT PATIENT CARE HANA TABLE (KITS) ×2 IMPLANT
KIT TURNOVER CYSTO (KITS) ×2 IMPLANT
MANIFOLD NEPTUNE II (INSTRUMENTS) ×2 IMPLANT
MAT ABSORB  FLUID 56X50 GRAY (MISCELLANEOUS) ×1
MAT ABSORB FLUID 56X50 GRAY (MISCELLANEOUS) ×1 IMPLANT
NAIL CANN TFNA 9 130D 380 (Nail) ×1 IMPLANT
NDL SPNL 20GX3.5 QUINCKE YW (NEEDLE) ×1 IMPLANT
NEEDLE SPNL 20GX3.5 QUINCKE YW (NEEDLE) ×2 IMPLANT
NS IRRIG 1000ML POUR BTL (IV SOLUTION) ×2 IMPLANT
PACK HIP COMPR (MISCELLANEOUS) ×2 IMPLANT
REAMER ROD DEEP FLUTE 2.5X950 (INSTRUMENTS) ×1 IMPLANT
STAPLER SKIN PROX 35W (STAPLE) ×2 IMPLANT
SUT VIC AB 0 CT1 36 (SUTURE) ×2 IMPLANT
SUT VIC AB 2-0 CT1 27 (SUTURE) ×2
SUT VIC AB 2-0 CT1 TAPERPNT 27 (SUTURE) ×2 IMPLANT
SYR 30ML LL (SYRINGE) ×2 IMPLANT
TOWEL OR 17X26 4PK STRL BLUE (TOWEL DISPOSABLE) ×2 IMPLANT
WATER STERILE IRR 500ML POUR (IV SOLUTION) ×2 IMPLANT

## 2022-02-18 NOTE — Progress Notes (Signed)
°  Progress Note   Patient: Andres [name] NFA:[id number] DOB: 1966-09-24 DOA: 02/17/2022     1 DOS: the patient was seen and examined on 02/18/2022   Brief hospital course: GERALD KUEHL is a 56 y.o. male with medical history significant of hypertension, hypothyroidism, gout, anxiety, schizophrenia, developmental delay, tardive dyskinesia, dCHF, who presents with fall and left hip pain.X-ray of left hip and CT scan of left hip showed intertrochanteric fracture of left proximal femur.   Assessment and Plan: No notes have been filed under this hospital service. Service: Hospitalist  Left hip fracture. Mechanical fall. Patient has been evaluated by orthopedics, pending surgery. Continue fluids for n.p.o. status. Patient did not have syncope prior to the fall.  Schizoaffective disorder, depression type. Tardive dyskinesia. Continue home medicines.  Urinary tension. Continue Foley catheter for now, start Flomax.  Diastolic congestive heart failure. No volume overload this time      Subjective:  Patient doing well today, she has some pain in the hip otherwise no other complaints  Physical Exam: Vitals:   02/17/22 2000 02/17/22 2146 02/18/22 0519 02/18/22 0848  BP: (!) 176/92 127/84 129/76 (!) 127/57  Pulse: 74 76 61 69  Resp: 17 20 20 20   Temp: 97.8 F (36.6 C) 98.9 F (37.2 C) 98 F (36.7 C) 98.2 F (36.8 C)  TempSrc: Oral   Oral  SpO2: 99% 100% 100% 100%   General exam: Appears calm and comfortable  Respiratory system: Clear to auscultation. Respiratory effort normal. Cardiovascular system: S1 & S2 heard, RRR. No JVD, murmurs, rubs, gallops or clicks. No pedal edema. Gastrointestinal system: Abdomen is nondistended, soft and nontender. No organomegaly or masses felt. Normal bowel sounds heard. Central nervous system: Alert and oriented. No focal neurological deficits. Extremities: Symmetric 5 x 5 power. Skin: No rashes, lesions or ulcers Psychiatry: Judgement and  insight appear normal. Mood & affect appropriate.    Data Reviewed: Reviewed hip x-ray and CT scan, reviewed all labs. Family Communication:   Disposition: Status is: Inpatient Remains inpatient appropriate because: Severity of disease, pending surgery.          Planned Discharge Destination:  Pending     Time spent: 28 minutes  Author: , MD 02/18/2022 11:01 AM  For on call review [email or web address].

## 2022-02-18 NOTE — Progress Notes (Signed)
Vitals entered manually- [facility] didn't send over ?

## 2022-02-18 NOTE — Op Note (Signed)
DATE OF SURGERY:  02/18/2022  TIME: 6:25 PM  PATIENT NAME:  Andres [name]  AGE: 56 y.o.  PRE-OPERATIVE DIAGNOSIS:  Left hip fracture  POST-OPERATIVE DIAGNOSIS:  SAME  PROCEDURE:  LEFT INTRAMEDULLARY (IM) NAIL INTERTROCHANTRIC  SURGEON:  Lyndle Herrlich  EBL:  100 cc  COMPLICATIONS:  none apparent  OPERATIVE IMPLANTS: Synthes trochanteric femoral nail  380 mm by 9 mm  with interlocking helical blade  90 mm  PREOPERATIVE INDICATIONS:  Andres [name] is a 56 y.o. year old who fell and suffered a hip fracture. He was brought into the ER and then admitted and optimized and then elected for surgical intervention.    The risks benefits and alternatives were discussed with the patient including but not limited to the risks of nonoperative treatment, versus surgical intervention including infection, bleeding, nerve injury, malunion, nonunion, hardware prominence, hardware failure, need for hardware removal, blood clots, cardiopulmonary complications, morbidity, mortality, among others, and they were willing to proceed.    OPERATIVE PROCEDURE:  The patient was brought to the operating room and placed in the supine position.  General anesthesia was administered, with a foley. He was placed on the fracture table.  Closed reduction was performed under C-arm guidance. The length of the femur was also measured using fluoroscopy. Time out was then performed after sterile prep and drape. He received preoperative antibiotics.  Incision was made proximal to the greater trochanter. A guidewire was placed in the appropriate position. Confirmation was made on AP and lateral views. The above-named nail was opened. I opened the proximal femur with a reamer. I then placed the nail by hand easily down. I did not need to ream the femur.  Once the nail was completely seated, I placed a guidepin into the femoral head into the center center position through a second incision.  I measured the length, and then reamed  the lateral cortex and up into the head. I then placed the helical blade. Slight compression was applied. Anatomic fixation achieved. Bone quality was poor.  I then secured the proximal interlock.  I then removed the instruments, and took final C-arm pictures AP and lateral the entire length of the leg. Anatomic reconstruction was achieved, and the wounds were irrigated copiously and closed with Vicryl  followed by staples and dry sterile dressing. Sponge and needle count were correct.   The patient was awakened and returned to PACU in stable and satisfactory condition. There no complications and the patient tolerated the procedure well.  He will be weightbearing as tolerated.    Lyndle Herrlich

## 2022-02-18 NOTE — Anesthesia Preprocedure Evaluation (Signed)
Anesthesia Evaluation  Patient identified by MRN, date of birth, ID band Patient awake    Reviewed: Allergy & Precautions, H&P , NPO status , Patient's Chart, lab work & pertinent test results, reviewed documented beta blocker date and time   Airway Mallampati: II  TM Distance: >3 FB Neck ROM: full    Dental  (+) Teeth Intact   Pulmonary neg pulmonary ROS,    Pulmonary exam normal        Cardiovascular Exercise Tolerance: Good hypertension, On Medications +CHF  Normal cardiovascular exam Rhythm:regular Rate:Normal     Neuro/Psych PSYCHIATRIC DISORDERS Anxiety Depression Schizophrenia negative neurological ROS     GI/Hepatic negative GI ROS, Neg liver ROS,   Endo/Other  Hypothyroidism   Renal/GU negative Renal ROS  negative genitourinary   Musculoskeletal   Abdominal   Peds  Hematology  (+) Blood dyscrasia, anemia ,   Anesthesia Other Findings Past Medical History: No date: Allergy Past Surgical History: No date: ANKLE SURGERY No date: COLON SURGERY No date: HERNIA REPAIR 06/18/2019: INTRAMEDULLARY (IM) NAIL INTERTROCHANTERIC; Right     Comment:  Procedure: INTRAMEDULLARY (IM) NAIL INTERTROCHANTRIC;                Surgeon: Lyndle Herrlich, MD;  Location: [facility] ORS;                Service: Orthopedics;  Laterality: Right; No date: TONSILLECTOMY BMI    Body Mass Index: 22.11 kg/m     Reproductive/Obstetrics negative OB ROS                             Anesthesia Physical Anesthesia Plan  ASA: 3  Anesthesia Plan: General ETT   Post-op Pain Management:    Induction:   PONV Risk Score and Plan: 3  Airway Management Planned:   Additional Equipment:   Intra-op Plan:   Post-operative Plan:   Informed Consent: I have reviewed the patients History and Physical, chart, labs and discussed the procedure including the risks, benefits and alternatives for the proposed anesthesia  with the patient or authorized representative who has indicated his/her understanding and acceptance.     Dental Advisory Given  Plan Discussed with: CRNA  Anesthesia Plan Comments: (Pt is very anxious and agitated in preop.  Will plan on GOT as discussed with sisters and patient. Patient agrees to this paln. ja)        Anesthesia Quick Evaluation

## 2022-02-18 NOTE — Transfer of Care (Signed)
Immediate Anesthesia Transfer of Care Note  Patient: Oday Ridings [place]  Procedure(s) Performed: INTRAMEDULLARY (IM) NAIL INTERTROCHANTRIC (Left)  Patient Location: PACU  Anesthesia Type:General  Level of Consciousness: sedated  Airway & Oxygen Therapy: Patient Spontanous Breathing and Patient connected to face mask oxygen  Post-op Assessment: Report given to RN and Post -op Vital signs reviewed and stable  Post vital signs: Reviewed  Last Vitals:  Vitals Value Taken Time  BP 131/84 02/18/22 1815  Temp    Pulse 82 02/18/22 1816  Resp 13 02/18/22 1816  SpO2 100 % 02/18/22 1816  Vitals shown include unvalidated device data.  Last Pain:  Vitals:   02/18/22 1534  TempSrc: Temporal  PainSc: 0-No pain         Complications: No notable events documented.

## 2022-02-18 NOTE — Progress Notes (Signed)
Initial Nutrition Assessment  DOCUMENTATION CODES:   Non-severe (moderate) malnutrition in context of chronic illness  INTERVENTION:   -Once diet is advanced, add:   -Ensure Enlive po BID, each supplement provides 350 kcal and 20 grams of protein -MVI with minerals daily  NUTRITION DIAGNOSIS:   Moderate Malnutrition related to chronic illness (CHF) as evidenced by mild fat depletion, mild muscle depletion.  GOAL:   Patient will meet greater than or equal to 90% of their needs  MONITOR:   PO intake, Supplement acceptance, Diet advancement, Labs, Weight trends, Skin, I & O's  REASON FOR ASSESSMENT:   Consult Assessment of nutrition requirement/status, Hip fracture protocol  ASSESSMENT:   Andres [name] is a 56 y.o. male with medical history significant of hypertension, hypothyroidism, gout, anxiety, schizophrenia, developmental delay, tardive dyskinesia, dCHF, who presents with fall and left hip pain.  Pt admitted with closed lt hip fracture.   Reviewed I/O's: -300 ml x 24 hours  UOP: 300 ml x 24 hours  Spoke with pt at bedside, who reports anxiety of over surgery and physical therapy. Pt reports the he usually has a good appetite, but has had to adjust to the menu at the group home, as they often do not have foods that he is used to. Pt reports he consumes 3 meals per day. He favorite foods include cheerios. He tries to eat a healthful diet and avoids food such as pizza, as it is too greasy.   Pt reports he thinks he has lost weight, but unsure of UBW.  Reviewed wt hx; pt has experienced a 10.5% wt loss over the past year, which is not significant for time frame.   Discussed importance of good meal and supplement intake to promote healing. Pt amenable to supplements.   Medications reviewed and include vitamin D3, colace, and thiamine.   Labs reviewed: CBGS: 97.    NUTRITION - FOCUSED PHYSICAL EXAM:  Flowsheet Row Most Recent Value  Orbital Region Mild depletion   Upper Arm Region Mild depletion  Thoracic and Lumbar Region No depletion  Buccal Region No depletion  Temple Region Mild depletion  Clavicle Bone Region Mild depletion  Clavicle and Acromion Bone Region Mild depletion  Scapular Bone Region Mild depletion  Dorsal Hand No depletion  Patellar Region No depletion  Anterior Thigh Region No depletion  Posterior Calf Region No depletion  Edema (RD Assessment) None  Hair Reviewed  Eyes Reviewed  Mouth Reviewed  Skin Reviewed  Nails Reviewed       Diet Order:   Diet Order             Diet NPO time specified Except for: Ice Chips, Sips with Meds  Diet effective midnight                   EDUCATION NEEDS:   Education needs have been addressed  Skin:  Skin Assessment: Reviewed RN Assessment  Last BM:  02/18/22  Height:   Ht Readings from Last 1 Encounters:  02/18/22 5\' 6"  (1.676 m)    Weight:   Wt Readings from Last 1 Encounters:  02/18/22 62.1 kg    Ideal Body Weight:  59.1 kg  BMI:  Body mass index is 22.11 kg/m.  Estimated Nutritional Needs:   Kcal:  1850-2050  Protein:  95-110 grams  Fluid:  > 1.8 L    02/20/22, RD, LDN, CDCES Registered Dietitian II Certified Diabetes Care and Education Specialist Please refer to [facility] for RD and/or RD  on-call/weekend/after hours pager

## 2022-02-18 NOTE — Anesthesia Procedure Notes (Signed)
Procedure Name: Intubation Date/Time: 02/18/2022 5:00 PM Performed by: Morene Crocker, CRNA Pre-anesthesia Checklist: Patient identified, Emergency Drugs available, Suction available and Patient being monitored Patient Re-evaluated:Patient Re-evaluated prior to induction Oxygen Delivery Method: Circle system utilized Preoxygenation: Pre-oxygenation with 100% oxygen Induction Type: IV induction Ventilation: Mask ventilation without difficulty Laryngoscope Size: McGraph and 3 Grade View: Grade I Tube type: Oral Tube size: 7.5 mm Number of attempts: 1 Airway Equipment and Method: Stylet Placement Confirmation: ETT inserted through vocal cords under direct vision, positive ETCO2 and breath sounds checked- equal and bilateral Secured at: 21 cm Tube secured with: Tape Dental Injury: Teeth and Oropharynx as per pre-operative assessment

## 2022-02-19 ENCOUNTER — Encounter: Payer: Self-pay | Admitting: Orthopedic Surgery

## 2022-02-19 DIAGNOSIS — E440 Moderate protein-calorie malnutrition: Secondary | ICD-10-CM | POA: Insufficient documentation

## 2022-02-19 LAB — CBC WITH DIFFERENTIAL/PLATELET
Abs Immature Granulocytes: 0.02 10*3/uL (ref 0.00–0.07)
Basophils Absolute: 0 10*3/uL (ref 0.0–0.1)
Basophils Relative: 0 %
Eosinophils Absolute: 0 10*3/uL (ref 0.0–0.5)
Eosinophils Relative: 0 %
HCT: 26.3 % — ABNORMAL LOW (ref 39.0–52.0)
Hemoglobin: 9 g/dL — ABNORMAL LOW (ref 13.0–17.0)
Immature Granulocytes: 0 %
Lymphocytes Relative: 13 %
Lymphs Abs: 0.7 10*3/uL (ref 0.7–4.0)
MCH: 30.9 pg (ref 26.0–34.0)
MCHC: 34.2 g/dL (ref 30.0–36.0)
MCV: 90.4 fL (ref 80.0–100.0)
Monocytes Absolute: 0.4 10*3/uL (ref 0.1–1.0)
Monocytes Relative: 9 %
Neutro Abs: 3.9 10*3/uL (ref 1.7–7.7)
Neutrophils Relative %: 78 %
Platelets: 204 10*3/uL (ref 150–400)
RBC: 2.91 MIL/uL — ABNORMAL LOW (ref 4.22–5.81)
RDW: 13.8 % (ref 11.5–15.5)
WBC: 5 10*3/uL (ref 4.0–10.5)
nRBC: 0 % (ref 0.0–0.2)

## 2022-02-19 LAB — GLUCOSE, CAPILLARY: Glucose-Capillary: 124 mg/dL — ABNORMAL HIGH (ref 70–99)

## 2022-02-19 LAB — BASIC METABOLIC PANEL
Anion gap: 10 (ref 5–15)
BUN: 24 mg/dL — ABNORMAL HIGH (ref 6–20)
CO2: 21 mmol/L — ABNORMAL LOW (ref 22–32)
Calcium: 8.4 mg/dL — ABNORMAL LOW (ref 8.9–10.3)
Chloride: 111 mmol/L (ref 98–111)
Creatinine, Ser: 0.91 mg/dL (ref 0.61–1.24)
GFR, Estimated: >60 mL/min (ref >60)
Glucose, Bld: 142 mg/dL — ABNORMAL HIGH (ref 70–99)
Potassium: 3.7 mmol/L (ref 3.5–5.1)
Sodium: 142 mmol/L (ref 135–145)

## 2022-02-19 LAB — MAGNESIUM: Magnesium: 1.9 mg/dL (ref 1.7–2.4)

## 2022-02-19 NOTE — Progress Notes (Signed)
Subjective:  Patient reports pain as mild.  Family in the room.  Objective:   VITALS:   Vitals:   02/18/22 2025 02/19/22 0548 02/19/22 0738 02/19/22 1324  BP: (!) 150/96 103/77 107/78 117/69  Pulse: 91 77 67 71  Resp: 20 20 18    Temp: 98.2 F (36.8 C) 98.4 F (36.9 C) 98.2 F (36.8 C)   TempSrc:      SpO2: 100% 100% 99% 100%  Weight:      Height:        PHYSICAL EXAM:  ABD soft Sensation intact distally Dorsiflexion/Plantar flexion intact Incision: dressing C/D/I No cellulitis present Compartment soft  LABS  Results for orders placed or performed during the hospital encounter of 02/17/22 (from the past 24 hour(s))  CBC with Differential/Platelet     Status: Abnormal   Collection Time: 02/19/22  5:32 AM  Result Value Ref Range   WBC 5.0 4.0 - 10.5 K/uL   RBC 2.91 (L) 4.22 - 5.81 MIL/uL   Hemoglobin 9.0 (L) 13.0 - 17.0 g/dL   HCT 26.3 (L) 39.0 - 52.0 %   MCV 90.4 80.0 - 100.0 fL   MCH 30.9 26.0 - 34.0 pg   MCHC 34.2 30.0 - 36.0 g/dL   RDW 13.8 11.5 - 15.5 %   Platelets 204 150 - 400 K/uL   nRBC 0.0 0.0 - 0.2 %   Neutrophils Relative % 78 %   Neutro Abs 3.9 1.7 - 7.7 K/uL   Lymphocytes Relative 13 %   Lymphs Abs 0.7 0.7 - 4.0 K/uL   Monocytes Relative 9 %   Monocytes Absolute 0.4 0.1 - 1.0 K/uL   Eosinophils Relative 0 %   Eosinophils Absolute 0.0 0.0 - 0.5 K/uL   Basophils Relative 0 %   Basophils Absolute 0.0 0.0 - 0.1 K/uL   Immature Granulocytes 0 %   Abs Immature Granulocytes 0.02 0.00 - 0.07 K/uL  Basic metabolic panel     Status: Abnormal   Collection Time: 02/19/22  5:32 AM  Result Value Ref Range   Sodium 142 135 - 145 mmol/L   Potassium 3.7 3.5 - 5.1 mmol/L   Chloride 111 98 - 111 mmol/L   CO2 21 (L) 22 - 32 mmol/L   Glucose, Bld 142 (H) 70 - 99 mg/dL   BUN 24 (H) 6 - 20 mg/dL   Creatinine, Ser 0.91 0.61 - 1.24 mg/dL   Calcium 8.4 (L) 8.9 - 10.3 mg/dL   GFR, Estimated >60 >60 mL/min   Anion gap 10 5 - 15  Magnesium     Status: None    Collection Time: 02/19/22  5:32 AM  Result Value Ref Range   Magnesium 1.9 1.7 - 2.4 mg/dL  Glucose, capillary     Status: Abnormal   Collection Time: 02/19/22  8:05 AM  Result Value Ref Range   Glucose-Capillary 124 (H) 70 - 99 mg/dL    CT Head Wo Contrast  Result Date: 02/17/2022 CLINICAL DATA:  Patient with fall. EXAM: CT HEAD WITHOUT CONTRAST TECHNIQUE: Contiguous axial images were obtained from the base of the skull through the vertex without intravenous contrast. RADIATION DOSE REDUCTION: This exam was performed according to the departmental dose-optimization program which includes automated exposure control, adjustment of the mA and/or kV according to patient size and/or use of iterative reconstruction technique. COMPARISON:  Brain CT 01/20/2022. FINDINGS: Brain: Ventricles and sulci are appropriate for patient's age. No evidence for acute cortical based infarct, intracranial hemorrhage, mass lesion or mass effect.  Vascular: Unremarkable Skull: Intact. Sinuses/Orbits: Paranasal sinuses are well aerated. Mastoid air cells are unremarkable. Other: None. IMPRESSION: No acute intracranial process. Electronically Signed   By: Lovey Newcomer M.D.   On: 02/17/2022 15:17   US Venous Img Lower Bilateral (DVT)  Result Date: 02/18/2022 CLINICAL DATA:  Bilateral lower extremity edema. Recent acute intratrochanteric fracture of the left hip. EXAM: BILATERAL LOWER EXTREMITY VENOUS DOPPLER ULTRASOUND TECHNIQUE: Gray-scale sonography with graded compression, as well as color Doppler and duplex ultrasound were performed to evaluate the lower extremity deep venous systems from the level of the common femoral vein and including the common femoral, femoral, profunda femoral, popliteal and calf veins including the posterior tibial, peroneal and gastrocnemius veins when visible. The superficial great saphenous vein was also interrogated. Spectral Doppler was utilized to evaluate flow at rest and with distal  augmentation maneuvers in the common femoral, femoral and popliteal veins. COMPARISON:  None. FINDINGS: RIGHT LOWER EXTREMITY Common Femoral Vein: No evidence of thrombus. Normal compressibility, respiratory phasicity and response to augmentation. Saphenofemoral Junction: No evidence of thrombus. Normal compressibility and flow on color Doppler imaging. Profunda Femoral Vein: No evidence of thrombus. Normal compressibility and flow on color Doppler imaging. Femoral Vein: No evidence of thrombus. Normal compressibility, respiratory phasicity and response to augmentation. Popliteal Vein: No evidence of thrombus. Normal compressibility, respiratory phasicity and response to augmentation. Calf Veins: No evidence of thrombus. Normal compressibility and flow on color Doppler imaging. Superficial Great Saphenous Vein: No evidence of thrombus. Normal compressibility. Venous Reflux:  None. Other Findings: No evidence of superficial thrombophlebitis or abnormal fluid collection. LEFT LOWER EXTREMITY Common Femoral Vein: No evidence of thrombus. Normal compressibility, respiratory phasicity and response to augmentation. Saphenofemoral Junction: No evidence of thrombus. Normal compressibility and flow on color Doppler imaging. Profunda Femoral Vein: No evidence of thrombus. Normal compressibility and flow on color Doppler imaging. Femoral Vein: No evidence of thrombus. Normal compressibility, respiratory phasicity and response to augmentation. Popliteal Vein: No evidence of thrombus. Normal compressibility, respiratory phasicity and response to augmentation. Calf Veins: No evidence of thrombus. Normal compressibility and flow on color Doppler imaging. Superficial Great Saphenous Vein: No evidence of thrombus. Normal compressibility. Venous Reflux:  None. Other Findings: No evidence of superficial thrombophlebitis or abnormal fluid collection. IMPRESSION: No evidence of deep venous thrombosis in either lower extremity.  Electronically Signed   By: Aletta Edouard M.D.   On: 02/18/2022 11:23   DG C-Arm 1-60 Min-No Report  Result Date: 02/18/2022 Fluoroscopy was utilized by the requesting physician.  No radiographic interpretation.   DG HIP UNILAT WITH PELVIS 2-3 VIEWS LEFT  Result Date: 02/19/2022 CLINICAL DATA:  Left intramedullary nail and operating room. EXAM: DG HIP (WITH OR WITHOUT PELVIS) 2-3V LEFT COMPARISON:  Pelvis and left hip radiographs 02/17/2022 FINDINGS: Images were performed intraoperatively without the presence of a radiologist. Patient appears to be undergoing intramedullary nail fixation of the previously seen displaced left proximal femoral intertrochanteric fracture. Please see intraoperative findings for further detail. IMPRESSION: Fluoroscopy for left proximal femoral intramedullary nail fixation. Electronically Signed   By: Yvonne Kendall M.D.   On: 02/19/2022 08:10    Assessment/Plan: 1 Day Post-Op   Principal Problem:   Closed left hip fracture (HCC) Active Problems:   Essential hypertension   Hypothyroidism   Schizoaffective disorder, depressive type ([place])   Tardive dyskinesia   Fall   Urine retention   Gout   Normocytic anemia   Chronic diastolic CHF (congestive heart failure) (HCC)   Malnutrition of moderate  degree   Up with therapy OK for discharge from orthopedic standpoint WBAT on the left leg ASA 81 mg BID for 6 weeks upon discharge RTC 2 weeks for staple removal Please call with questions   Lovell Sheehan , MD 02/19/2022, 2:48 PM

## 2022-02-19 NOTE — Care Management Important Message (Signed)
Important Message  Patient Details  Name: Andres [name] MRN: [id number] Date of Birth: 28-May-1966   Medicare Important Message Given:  N/A - LOS <3 / Initial given by admissions     Olegario Messier A Krystall Kruckenberg 02/19/2022, 9:06 AM

## 2022-02-19 NOTE — Progress Notes (Signed)
Physical Therapy Treatment Patient Details Name: Andres [name] MRN: [id number] DOB: [birth year] Today's Date: 02/19/2022   History of Present Illness Patient is a 56 year old male s/p Left Intramedullary Nail Intertrochantric surgeon on 02/18/22 due to a left hip fracture. Patient has a PMH (+) for  hypertension, hypothyroidism, gout, anxiety, schizophrenia, developmental delay, tardive dyskinesia, dCHF.    PT Comments    Patient tolerated PM session better compared to AM session. RN in room upon arrival (just prescribed pain medication, patient supine in bed. Patient reported no pain at rest, however with functional mobility facial grimacing was made. Patient was able to complete supine<>sitting at Mod A at the trunk and BLEs. Upon sitting patient competed therapeutic exercises to help with swelling, ROM, and strength. With increased motivation, patient completed to sit to stand transfers with RW at Min-Mod A. Patient was cued to increased weight-bearing on the LLE due to significant weight-bearing on the RLE, however patient refused. Patient declined ambulation today. Patient is progressing towards his goals and would continue to benefit from skilled physical therapy in order to optimize patient's return to PLOF. Continue to recommend STR upon discharge from acute hospitalization.    Recommendations for follow up therapy are one component of a multi-disciplinary discharge planning process, led by the attending physician.  Recommendations may be updated based on patient status, additional functional criteria and insurance authorization.  Follow Up Recommendations  Skilled nursing-short term rehab (<3 hours/day)     Assistance Recommended at Discharge Frequent or constant Supervision/Assistance  Patient can return home with the following Two people to help with walking and/or transfers;A lot of help with bathing/dressing/bathroom;Assistance with cooking/housework;Assistance with feeding;Two people  to help with bathing/dressing/bathroom;Direct supervision/assist for financial management;Direct supervision/assist for medications management;Assist for transportation;Help with stairs or ramp for entrance   Equipment Recommendations  Rolling walker (2 wheels)    Recommendations for Other Services       Precautions / Restrictions Precautions Precautions: Fall Restrictions Weight Bearing Restrictions: Yes LLE Weight Bearing: Weight bearing as tolerated     Mobility  Bed Mobility Overal bed mobility: Needs Assistance Bed Mobility: Supine to Sit, Sit to Supine Rolling: Mod assist   Supine to sit: Mod assist     General bed mobility comments: Mod A for bed mobility with assistance at trunk and BLEs Patient Response: Anxious  Transfers Overall transfer level: Needs assistance Equipment used: Rolling walker (2 wheels) Transfers: Sit to/from Stand (x2) Sit to Stand: Min assist, Mod assist                Ambulation/Gait Ambulation/Gait assistance:  (patient refused ambulation today)                 Stairs             Wheelchair Mobility    Modified Rankin (Stroke Patients Only)       Balance Overall balance assessment: Needs assistance Sitting-balance support: Bilateral upper extremity supported Sitting balance-Leahy Scale: Poor   Postural control: Posterior lean, Right lateral lean Standing balance support: Bilateral upper extremity supported, Reliant on assistive device for balance Standing balance-Leahy Scale: Poor Standing balance comment: Significant weight-bearing on RLE, patient cueing to increase weight-bearing on LLE, however patient refused                            Cognition Arousal/Alertness: Awake/alert Behavior During Therapy: WFL for tasks assessed/performed Overall Cognitive Status: No family/caregiver present to determine baseline cognitive  functioning                                 General  Comments: A&Ox3- self, location, situation        Exercises General Exercises - Lower Extremity Ankle Circles/Pumps: Seated, 15 reps, AROM, Both Long Arc Quad: Seated, 10 reps, AROM, Both Other Exercises Other Exercises: patient educated on role of PT in acute care setting, WBing precautions, fall risk, and d/c plans    General Comments General comments (skin integrity, edema, etc.): Patient continues to be very anxious and pain limiting      Pertinent Vitals/Pain Pain Assessment Pain Assessment: Faces Pain Score: 5  Faces Pain Scale: Hurts a little bit Pain Location: Left leg Pain Descriptors / Indicators: Dull, Discomfort Pain Intervention(s): Limited activity within patient's tolerance, Monitored during session, Repositioned, RN gave pain meds during session    Home Living                          Prior Function            PT Goals (current goals can now be found in the care plan section) Acute Rehab PT Goals Patient Stated Goal: to get better PT Goal Formulation: With patient Time For Goal Achievement: 03/05/22 Potential to Achieve Goals: Fair Progress towards PT goals: Progressing toward goals    Frequency    7X/week      PT Plan Current plan remains appropriate    Co-evaluation              AM-PAC PT "6 Clicks" Mobility   Outcome Measure  Help needed turning from your back to your side while in a flat bed without using bedrails?: A Lot Help needed moving from lying on your back to sitting on the side of a flat bed without using bedrails?: A Lot Help needed moving to and from a bed to a chair (including a wheelchair)?: A Lot Help needed standing up from a chair using your arms (e.g., wheelchair or bedside chair)?: A Lot Help needed to walk in hospital room?: A Lot Help needed climbing 3-5 steps with a railing? : Total 6 Click Score: 11    End of Session Equipment Utilized During Treatment: Gait belt Activity Tolerance: Patient  limited by pain Patient left: in bed;with bed alarm set;with call bell/phone within reach Nurse Communication: Mobility status PT Visit Diagnosis: Unsteadiness on feet (R26.81);Muscle weakness (generalized) (M62.81);Difficulty in walking, not elsewhere classified (R26.2);Pain;Repeated falls (R29.6);History of falling (Z91.81) Pain - Right/Left: Left Pain - part of body: Hip     Time: 1326-1340 PT Time Calculation (min) (ACUTE ONLY): 14 min  Charges:  $Therapeutic Exercise: 8-22 mins                     Angelica Ran, PT  02/19/22. 2:21 PM

## 2022-02-19 NOTE — Evaluation (Signed)
Occupational Therapy Evaluation Patient Details Name: Andres [name] MRN: [id number] DOB: 01-Mar-1966 Today's Date: 02/19/2022   History of Present Illness Patient is a 56 year old male s/p Left Intramedullary Nail Intertrochantric surgeon on 02/18/22 due to a left hip fracture. Patient has a PMH (+) for  hypertension, hypothyroidism, gout, anxiety, schizophrenia, developmental delay, tardive dyskinesia, dCHF.   Clinical Impression   Pt seen for OT evaluation this date, POD#1 from above surgery. Pt was independent in all ADL and mobility per chart prior to surgery and residing in a group home. Per chart, several recent falls prior to admission. Pt is eager to return to PLOF with less pain and improved safety and independence. Pt currently requires MAX A for LB dressing and bathing while in seated position due to pain and limited AROM of L hip, set up and PRN MIN A for seated UB ADL, and MIN-MOD A for ADL transfers and lateral steps EOB with RW +VC for RW mgt. Reassurance provided during ADL mobility to support participation and celebrate progress made. Pt would benefit from additional instruction in self care skills and techniques with or without assistive devices to support recall and carryover prior to discharge. Recommend SNF for short term rehab upon discharge.     Recommendations for follow up therapy are one component of a multi-disciplinary discharge planning process, led by the attending physician.  Recommendations may be updated based on patient status, additional functional criteria and insurance authorization.   Follow Up Recommendations  Skilled nursing-short term rehab (<3 hours/day)    Assistance Recommended at Discharge Frequent or constant Supervision/Assistance  Patient can return home with the following A lot of help with walking and/or transfers;A lot of help with bathing/dressing/bathroom;Direct supervision/assist for medications management;Direct supervision/assist for financial  management;Assist for transportation;Assistance with cooking/housework;Help with stairs or ramp for entrance    Functional Status Assessment  Patient has had a recent decline in their functional status and demonstrates the ability to make significant improvements in function in a reasonable and predictable amount of time.  Equipment Recommendations  BSC/3in1    Recommendations for Other Services       Precautions / Restrictions Precautions Precautions: Fall Restrictions Weight Bearing Restrictions: Yes LLE Weight Bearing: Weight bearing as tolerated      Mobility Bed Mobility Overal bed mobility: Needs Assistance Bed Mobility: Supine to Sit, Sit to Supine     Supine to sit: Mod assist Sit to supine: Mod assist   General bed mobility comments: MOD A for LLE vs BLE mgt    Transfers Overall transfer level: Needs assistance Equipment used: Rolling walker (2 wheels) Transfers: Sit to/from Stand, Bed to chair/wheelchair/BSC Sit to Stand: Min assist, Mod assist          Lateral/Scoot Transfers: Mod assist, Min assist General transfer comment: VC for sequencing with RW to take lateral steps EOB      Balance Overall balance assessment: Needs assistance Sitting-balance support: Bilateral upper extremity supported, Single extremity supported Sitting balance-Leahy Scale: Fair     Standing balance support: Bilateral upper extremity supported, During functional activity, Reliant on assistive device for balance Standing balance-Leahy Scale: Poor                             ADL either performed or assessed with clinical judgement   ADL Overall ADL's : Needs assistance/impaired  General ADL Comments: Pt currently requires MAX A for LB ADL tasks, PRN MIN A for seated UB ADL tasks. MIN-MOD A for ADL transfers +VC for RW mgt     Vision         Perception     Praxis      Pertinent Vitals/Pain Pain  Assessment Pain Assessment: Faces Faces Pain Scale: Hurts little more Pain Location: L hip with WBing/standing Pain Descriptors / Indicators: Aching, Grimacing, Guarding Pain Intervention(s): Limited activity within patient's tolerance, Monitored during session, Repositioned, Premedicated before session     Hand Dominance Right   Extremity/Trunk Assessment Upper Extremity Assessment Upper Extremity Assessment: Generalized weakness   Lower Extremity Assessment Lower Extremity Assessment: Generalized weakness;LLE deficits/detail LLE: Unable to fully assess due to pain       Communication Communication Communication: No difficulties   Cognition Arousal/Alertness: Awake/alert Behavior During Therapy: WFL for tasks assessed/performed Overall Cognitive Status: No family/caregiver present to determine baseline cognitive functioning                                 General Comments: alert and oriented x3, follows commands with cues, a bit anxious     General Comments  Patient continues to be very anxious and pain limiting    Exercises Other Exercises Other Exercises: Pt instructed in bed mobility, ADL transfers, RW mgt   Shoulder Instructions      Home Living Family/patient expects to be discharged to:: Group home Living Arrangements: Group Home                               Additional Comments: ~4 falls in last week      Prior Functioning/Environment Prior Level of Function : Independent/Modified Independent             Mobility Comments: Independent ADLs Comments: Independent (needed assistance after his fall)        OT Problem List: Decreased strength;Decreased range of motion;Pain;Decreased cognition;Decreased safety awareness;Impaired balance (sitting and/or standing);Decreased knowledge of use of DME or AE      OT Treatment/Interventions: Self-care/ADL training;Therapeutic exercise;Therapeutic activities;DME and/or AE  instruction;Patient/family education;Balance training    OT Goals(Current goals can be found in the care plan section) Acute Rehab OT Goals Patient Stated Goal: get better and be able to go to [facility] OT Goal Formulation: With patient Time For Goal Achievement: 03/05/22 Potential to Achieve Goals: Good ADL Goals Pt Will Perform Lower Body Dressing: with modified independence;with adaptive equipment;sit to/from stand Pt Will Transfer to Toilet: ambulating;bedside commode;with min guard assist (LRAD) Pt Will Perform Toileting - Clothing Manipulation and hygiene: sit to/from stand;with set-up;with supervision  OT Frequency: Min 2X/week    Co-evaluation              AM-PAC OT "6 Clicks" Daily Activity     Outcome Measure Help from another person eating meals?: None Help from another person taking care of personal grooming?: A Little Help from another person toileting, which includes using toliet, bedpan, or urinal?: A Lot Help from another person bathing (including washing, rinsing, drying)?: A Lot Help from another person to put on and taking off regular upper body clothing?: A Little Help from another person to put on and taking off regular lower body clothing?: A Lot 6 Click Score: 16   End of Session Equipment Utilized During Treatment: Gait belt;Rolling walker (2 wheels)  Nurse Communication: Other (comment);Mobility status (assist from nurse tech for pericare)  Activity Tolerance: Patient tolerated treatment well Patient left: in bed;with call bell/phone within reach;with bed alarm set  OT Visit Diagnosis: Other abnormalities of gait and mobility (R26.89);Muscle weakness (generalized) (M62.81);Pain Pain - Right/Left: Left Pain - part of body: Hip                Time: [id number] OT Time Calculation (min): 11 min Charges:  OT General Charges $OT Visit: 1 Visit OT Evaluation $OT Eval Moderate Complexity: 1 Mod  Ardeth Perfect., MPH, MS, OTR/L ascom [phone] 02/19/22,  5:18 PM

## 2022-02-19 NOTE — Anesthesia Postprocedure Evaluation (Signed)
Anesthesia Post Note  Patient: Konstantinos Cordoba Sharples  Procedure(s) Performed: INTRAMEDULLARY (IM) NAIL INTERTROCHANTRIC (Left)  Patient location during evaluation: PACU Anesthesia Type: General Level of consciousness: awake and alert Pain management: pain level controlled Vital Signs Assessment: post-procedure vital signs reviewed and stable Respiratory status: spontaneous breathing, nonlabored ventilation, respiratory function stable and patient connected to nasal cannula oxygen Cardiovascular status: blood pressure returned to baseline and stable Postop Assessment: no apparent nausea or vomiting Anesthetic complications: no   No notable events documented.   Last Vitals:  Vitals:   02/19/22 1324 02/19/22 1616  BP: 117/69 130/79  Pulse: 71 84  Resp:  16  Temp:  36.6 C  SpO2: 100% 100%    Last Pain:  Vitals:   02/19/22 1403  TempSrc:   PainSc: 2                  Yevette Edwards

## 2022-02-19 NOTE — Evaluation (Signed)
Physical Therapy Evaluation Patient Details Name: Andres [name] MRN: [id number] DOB: 1966-12-16 Today's Date: 02/19/2022  History of Present Illness  Patient is a 56 year old male s/p Left Intramedullary Nail Intertrochantric surgeon on 02/18/22 due to a left hip fracture. Patient has a PMH (+) for  hypertension, hypothyroidism, gout, anxiety, schizophrenia, developmental delay, tardive dyskinesia, dCHF.   Clinical Impression  Physical Therapy evaluation completed on this date. Limited session has patient had continuous lose stool throughout session, and increased pain. Per patient report, he came from a group home, and was independent with all ADLs and Mobility before his fall. Pain was rated at a 5/10, and patient refused sitting EOB or performing out of bed activity. Stated he will attempt this afternoon. Patient is alert and oriented x3.   Patient demonstrated generalized weakness in BLEs (LLE>RLE) with at least 3+/5 strength on the RLE, and unable to assess LLE due to pain. Patient was able to plantarflex into therapists hand, however unable to perform AROM hip abd/adduction, knee flexion, or SLR. Patient found in pool of lose stool. RN called to assist. Required Mod A to roll bilaterally for Max A pericare. Patient able to initiate roll by cross-body reaching to bed rail, however required assistance at trunk and hip to complete. Once patient was cleaned, patient has another loose bowl movement that required assistance +2 to complete again. Will attempt EOB and out of bed activities in next session. Patient would continue to benefit from skilled physical therapy in order to optimize patient's return to PLOF. Recommend STR upon discharge from acute hospitalization.      Recommendations for follow up therapy are one component of a multi-disciplinary discharge planning process, led by the attending physician.  Recommendations may be updated based on patient status, additional functional criteria and  insurance authorization.  Follow Up Recommendations Skilled nursing-short term rehab (<3 hours/day)    Assistance Recommended at Discharge Frequent or constant Supervision/Assistance  Patient can return home with the following  Two people to help with walking and/or transfers;A lot of help with bathing/dressing/bathroom;Assistance with cooking/housework;Assistance with feeding;Two people to help with bathing/dressing/bathroom;Direct supervision/assist for financial management;Direct supervision/assist for medications management;Assist for transportation;Help with stairs or ramp for entrance    Equipment Recommendations Rolling walker (2 wheels)  Recommendations for Other Services  OT consult    Functional Status Assessment Patient has had a recent decline in their functional status and demonstrates the ability to make significant improvements in function in a reasonable and predictable amount of time.     Precautions / Restrictions Precautions Precautions: Fall Restrictions Weight Bearing Restrictions: Yes LLE Weight Bearing: Weight bearing as tolerated      Mobility  Bed Mobility Overal bed mobility: Needs Assistance Bed Mobility: Rolling Rolling: Mod assist         General bed mobility comments: Mod Assist to roll bilaterally twice for Pericare, patient able to initiate corss body reach bilaterally with hands to bed rail, however requires assistance at trunk and hips to complete roll Patient Response: Anxious  Transfers Overall transfer level:  (not assessed- patient declined)                      Ambulation/Gait Ambulation/Gait assistance:  (no assessed- patient declined)                Stairs            Wheelchair Mobility    Modified Rankin (Stroke Patients Only)  Balance Overall balance assessment:  (not assessed)                                           Pertinent Vitals/Pain Pain Assessment Pain  Assessment: 0-10 Pain Score: 5  Pain Location: upper leg Pain Descriptors / Indicators: Dull, Discomfort Pain Intervention(s): Limited activity within patient's tolerance, Monitored during session, Repositioned    Home Living Family/patient expects to be discharged to:: Group home Living Arrangements: Group Home                 Additional Comments: ~4 falls in last week    Prior Function Prior Level of Function : Independent/Modified Independent             Mobility Comments: Independent ADLs Comments: Independent (needed assistance after his fall)     Hand Dominance   Dominant Hand: Right    Extremity/Trunk Assessment   Upper Extremity Assessment Upper Extremity Assessment: Generalized weakness    Lower Extremity Assessment Lower Extremity Assessment: Generalized weakness;LLE deficits/detail;RLE deficits/detail RLE Deficits / Details: At least 3+/5 strength LLE Deficits / Details: Limited ROM and strength due to pain       Communication   Communication: No difficulties  Cognition Arousal/Alertness: Awake/alert Behavior During Therapy: WFL for tasks assessed/performed Overall Cognitive Status: No family/caregiver present to determine baseline cognitive functioning                                 General Comments: A&Ox3- self, location, situation        General Comments General comments (skin integrity, edema, etc.): Patient very anxious and pain limiting    Exercises Other Exercises Other Exercises: patient educated on role of PT in acute care setting, WBing precautions, fall risk, and d/c plans   Assessment/Plan    PT Assessment Patient needs continued PT services  PT Problem List Decreased strength;Decreased balance;Decreased cognition;Decreased mobility;Decreased range of motion;Decreased knowledge of precautions;Decreased knowledge of use of DME;Decreased activity tolerance;Decreased coordination;Decreased safety awareness;Pain        PT Treatment Interventions      PT Goals (Current goals can be found in the Care Plan section)  Acute Rehab PT Goals Patient Stated Goal: to get better PT Goal Formulation: With patient Time For Goal Achievement: 03/05/22 Potential to Achieve Goals: Fair    Frequency BID     Co-evaluation               AM-PAC PT "6 Clicks" Mobility  Outcome Measure Help needed turning from your back to your side while in a flat bed without using bedrails?: A Lot Help needed moving from lying on your back to sitting on the side of a flat bed without using bedrails?: A Lot Help needed moving to and from a bed to a chair (including a wheelchair)?: A Lot Help needed standing up from a chair using your arms (e.g., wheelchair or bedside chair)?: A Lot Help needed to walk in hospital room?: A Lot Help needed climbing 3-5 steps with a railing? : Total 6 Click Score: 11    End of Session   Activity Tolerance: Patient limited by pain Patient left: in bed;with nursing/sitter in room Nurse Communication: Mobility status PT Visit Diagnosis: Unsteadiness on feet (R26.81);Muscle weakness (generalized) (M62.81);Difficulty in walking, not elsewhere classified (R26.2);Pain;Repeated falls (R29.6);History of falling (Z91.81) Pain -  Right/Left: Left Pain - part of body: Hip    Time: [id number] PT Time Calculation (min) (ACUTE ONLY): 33 min   Charges:   PT Evaluation $PT Eval Low Complexity: 1 Low PT Treatments $Self Care/Home Management: 8-22        Iva Boop, PT  02/19/22. 10:04 AM

## 2022-02-19 NOTE — Progress Notes (Signed)
°  Progress Note   Patient: Andres [name] [id number] DOB: Oct 30, 1966 DOA: 02/17/2022     2 DOS: the patient was seen and examined on 02/19/2022   Brief hospital course: Andres [name] is a 56 y.o. male with medical history significant of hypertension, hypothyroidism, gout, anxiety, schizophrenia, developmental delay, tardive dyskinesia, dCHF, who presents with fall and left hip pain.X-ray of left hip and CT scan of left hip showed intertrochanteric fracture of left proximal femur.  Left intramedullary nailing of intertrochanteric was performed on 2/23.  Assessment and Plan: Left hip fracture. Mechanical fall. Status postsurgery.  Patient doing well.  Good appetite.  Discontinue fluids.  Recheck a CBC tomorrow.  PT has recommended nursing home placement.  Schizoaffective disorder, depression type. Tardive dyskinesia. Condition stable, continue home medicines.  Urinary tension secondary to benign prostate hypertrophy. Continue Flomax, consider discontinuing Foley catheter in 1 to 2 days.   Chronic diastolic congestive heart failure. Stable.     Subjective:  Patient doing well postop, currently no complaints.   Denies any short of breath or cough. No abdominal pain or nausea vomiting.  Physical Exam: Vitals:   02/18/22 1906 02/18/22 2025 02/19/22 0548 02/19/22 0738  BP: 134/88 (!) 150/96 103/77 107/78  Pulse: 85 91 77 67  Resp: 20 20 20 18   Temp: 98 F (36.7 C) 98.2 F (36.8 C) 98.4 F (36.9 C) 98.2 F (36.8 C)  TempSrc:      SpO2: 100% 100% 100% 99%  Weight:      Height:       General exam: Appears calm and comfortable  Respiratory system: Clear to auscultation. Respiratory effort normal. Cardiovascular system: S1 & S2 heard, RRR. No JVD, murmurs, rubs, gallops or clicks. No pedal edema. Gastrointestinal system: Abdomen is nondistended, soft and nontender. No organomegaly or masses felt. Normal bowel sounds heard. Central nervous system: Alert and oriented. No focal  neurological deficits. Extremities: Symmetric 5 x 5 power. Skin: No rashes, lesions or ulcers Psychiatry: Judgement and insight appear normal. Mood & affect appropriate.    Data Reviewed: Reviewed all lab results.  Family Communication:   Disposition: Status is: Inpatient Remains inpatient appropriate because: Severity of disease, postop day 1.          Planned Discharge Destination: Skilled nursing facility     Time spent: 27 minutes  Author: , MD 02/19/2022 12:17 PM  For on call review [email or web address].

## 2022-02-20 LAB — CBC WITH DIFFERENTIAL/PLATELET
Abs Immature Granulocytes: 0.02 10*3/uL (ref 0.00–0.07)
Basophils Absolute: 0 10*3/uL (ref 0.0–0.1)
Basophils Relative: 0 %
Eosinophils Absolute: 0.3 10*3/uL (ref 0.0–0.5)
Eosinophils Relative: 5 %
HCT: 26.9 % — ABNORMAL LOW (ref 39.0–52.0)
Hemoglobin: 9 g/dL — ABNORMAL LOW (ref 13.0–17.0)
Immature Granulocytes: 0 %
Lymphocytes Relative: 11 %
Lymphs Abs: 0.6 10*3/uL — ABNORMAL LOW (ref 0.7–4.0)
MCH: 31 pg (ref 26.0–34.0)
MCHC: 33.5 g/dL (ref 30.0–36.0)
MCV: 92.8 fL (ref 80.0–100.0)
Monocytes Absolute: 0.5 10*3/uL (ref 0.1–1.0)
Monocytes Relative: 8 %
Neutro Abs: 4.1 10*3/uL (ref 1.7–7.7)
Neutrophils Relative %: 76 %
Platelets: 173 10*3/uL (ref 150–400)
RBC: 2.9 MIL/uL — ABNORMAL LOW (ref 4.22–5.81)
RDW: 14.4 % (ref 11.5–15.5)
WBC: 5.5 10*3/uL (ref 4.0–10.5)
nRBC: 0 % (ref 0.0–0.2)

## 2022-02-20 LAB — GLUCOSE, CAPILLARY: Glucose-Capillary: 110 mg/dL — ABNORMAL HIGH (ref 70–99)

## 2022-02-20 NOTE — [place] FL2 (Addendum)
[place] MEDICAID FL2 LEVEL OF CARE SCREENING TOOL     IDENTIFICATION  Patient Name: Andres [name] Birthdate: May 18, 1966 Sex: male Admission Date (Current Location): 02/17/2022  [place] and [place] Number:  Chiropodist and Address:  [facility], [address] [place], [place] [address]      Provider Number: [id number]  Attending Physician Name and Address:  Marrion Coy, MD  Relative Name and Phone Number:       Current Level of Care: Hospital Recommended Level of Care: Skilled Nursing Facility Prior Approval Number:    Date Approved/Denied:   PASRR Number: [id number] Expires 03/25/22    Discharge Plan: SNF    Current Diagnoses: Patient Active Problem List   Diagnosis Date Noted   Malnutrition of moderate degree 02/19/2022   Closed left hip fracture (HCC) 02/17/2022   Fall 02/17/2022   Urine retention 02/17/2022   Gout 02/17/2022   Normocytic anemia 02/17/2022   Chronic diastolic CHF (congestive heart failure) (HCC) 02/17/2022   Hypotension 01/06/2022   At risk for prolonged QT interval syndrome 08/04/2021   High risk medication use 08/04/2021   Hypersomnia 08/04/2021   Bereavement 08/04/2021   Lymphedema 04/27/2021   Insomnia due to mental disorder 12/03/2020   Anxiety disorder 10/08/2020   Chronic venous insufficiency 10/10/2019   Drug-induced parkinsonism (HCC) 10/10/2019   Schizoaffective disorder, depressive type (HCC) 07/05/2019   Tardive dyskinesia 07/05/2019   Loose stools 07/07/2018   Weight loss 06/09/2018   Hypothyroidism 02/28/2018   Vitamin D deficiency, unspecified 02/28/2018   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   Development delay 01/18/2018   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   Localized edema 09/26/2015   Lack of expected normal physiological development 05/09/2015   FHx: colon cancer 05/09/2015   Essential hypertension 05/09/2015   Constitutional short stature 05/09/2015   Tibia/fibula  fracture 08/21/2013   Seasonal allergies 08/21/2013   Allergic rhinitis 08/21/2013   Schizoaffective disorder, in remission (HCC) 05/05/2004    Orientation RESPIRATION BLADDER Height & Weight     Self, Situation, Place, Time  Normal Continent Weight: 137 lb (62.1 kg) Height:  5\' 6"  (167.6 cm)  BEHAVIORAL SYMPTOMS/MOOD NEUROLOGICAL BOWEL NUTRITION STATUS      Incontinent Diet (regular diet)  AMBULATORY STATUS COMMUNICATION OF NEEDS Skin   Extensive Assist Verbally Surgical wounds (closed incision on left leg)                       Personal Care Assistance Level of Assistance  Bathing, Dressing, Feeding Bathing Assistance: Maximum assistance Feeding assistance: Independent Dressing Assistance: Maximum assistance     Functional Limitations Info  Sight, Hearing, Speech Sight Info: Adequate Hearing Info: Adequate Speech Info: Adequate    SPECIAL CARE FACTORS FREQUENCY  PT (By licensed PT), OT (By licensed OT)     PT Frequency: 5x OT Frequency: 5x            Contractures Contractures Info: Not present    Additional Factors Info  Code Status, Allergies Code Status Info: full code Allergies Info: Prednisone, Lamotrigine           Current Medications (02/20/2022):  This is the current hospital active medication list Current Facility-Administered Medications  Medication Dose Route Frequency Provider Last Rate Last Admin   acetaminophen (TYLENOL) tablet 650 mg  650 mg Oral Q6H PRN 02/22/2022, MD       allopurinol (ZYLOPRIM) tablet 100 mg  100 mg Oral Daily Lyndle Herrlich, MD  100 mg at 02/20/22 9935   Chlorhexidine Gluconate Cloth 2 % PADS 6 each  6 each Topical Daily Lyndle Herrlich, MD   6 each at 02/19/22 7017   cholecalciferol (VITAMIN D3) tablet 1,000 Units  1,000 Units Oral Daily Lyndle Herrlich, MD   1,000 Units at 02/20/22 0955   citalopram (CELEXA) tablet 40 mg  40 mg Oral Daily Lyndle Herrlich, MD   40 mg at 02/20/22 7939   docusate sodium  (COLACE) capsule 100 mg  100 mg Oral BID Lyndle Herrlich, MD   100 mg at 02/19/22 2154   enoxaparin (LOVENOX) injection 40 mg  40 mg Subcutaneous Q24H Lyndle Herrlich, MD   40 mg at 02/20/22 0954   feeding supplement (ENSURE ENLIVE / ENSURE PLUS) liquid 237 mL  237 mL Oral BID BM Lyndle Herrlich, MD   237 mL at 02/20/22 1451   hydrALAZINE (APRESOLINE) injection 5 mg  5 mg Intravenous Q2H PRN Lyndle Herrlich, MD       levothyroxine (SYNTHROID) tablet 25 mcg  25 mcg Oral Q0600 Lyndle Herrlich, MD   25 mcg at 02/20/22 0300   loratadine (CLARITIN) tablet 10 mg  10 mg Oral Daily Lyndle Herrlich, MD   10 mg at 02/20/22 9233   methocarbamol (ROBAXIN) tablet 500 mg  500 mg Oral Q8H PRN Lyndle Herrlich, MD       metoCLOPramide (REGLAN) tablet 5-10 mg  5-10 mg Oral Q8H PRN Lyndle Herrlich, MD       Or   metoCLOPramide (REGLAN) injection 5-10 mg  5-10 mg Intravenous Q8H PRN Lyndle Herrlich, MD       morphine (PF) 2 MG/ML injection 2 mg  2 mg Intravenous Q4H PRN Lyndle Herrlich, MD       multivitamin with minerals tablet 1 tablet  1 tablet Oral Daily Lyndle Herrlich, MD   1 tablet at 02/20/22 0954   ondansetron (ZOFRAN) tablet 4 mg  4 mg Oral Q6H PRN Lyndle Herrlich, MD       Or   ondansetron [facility]) injection 4 mg  4 mg Intravenous Q6H PRN Lyndle Herrlich, MD       oxyCODONE-acetaminophen (PERCOCET/ROXICET) 5-325 MG per tablet 1 tablet  1 tablet Oral Q4H PRN Lyndle Herrlich, MD   1 tablet at 02/20/22 0954   propranolol (INDERAL) tablet 5 mg  5 mg Oral BID Lyndle Herrlich, MD   5 mg at 02/20/22 0076   QUEtiapine (SEROQUEL) tablet 25 mg  25 mg Oral TID Lyndle Herrlich, MD   25 mg at 02/20/22 2263   senna-docusate (Senokot-S) tablet 1 tablet  1 tablet Oral QHS PRN Lyndle Herrlich, MD       thiamine tablet 100 mg  100 mg Oral Daily Lyndle Herrlich, MD   100 mg at 02/20/22 0954   traZODone (DESYREL) tablet 50 mg  50 mg Oral QHS Lyndle Herrlich, MD   50 mg at 02/19/22 2155   valbenazine (INGREZZA) capsule 80 mg   80 mg Oral QHS Lyndle Herrlich, MD   80 mg at 02/19/22 2156     Discharge Medications: Please see discharge summary for a list of discharge medications.  Relevant Imaging Results:  Relevant Lab Results:   Additional Information SSN:[id number]  Reuel Boom Dalasia Predmore, LCSW

## 2022-02-20 NOTE — Clinical Social Work Note (Signed)
RE: Andres [name]    Date of Birth:   [birth year]___________  Date: [id number]       To Whom It May Concern:  Please be advised that the above-named patient will require a short-term nursing home stay - anticipated 30 days or less for rehabilitation and strengthening.  The plan is for return home.    MD electronic signature noted below

## 2022-02-20 NOTE — Progress Notes (Signed)
Subjective:  POD #2 s/p intramedullary fixation for left intertrochanteric hip fracture.   Patient reports left hip pain as mild.  Patient up out of bed to a chair.  Physical therapist states the patient is making progress.  Objective:   VITALS:   Vitals:   02/19/22 2153 02/20/22 0549 02/20/22 0746 02/20/22 1204  BP: 129/80 121/80 122/79 117/82  Pulse: 66 80 73 90  Resp: 16 15 15 16   Temp: 97.8 F (36.6 C) 98 F (36.7 C) 98 F (36.7 C) 98.5 F (36.9 C)  TempSrc:  Axillary    SpO2: 100% 99% 100% 100%  Weight:      Height:        PHYSICAL EXAM: Left lower extremity Neurovascular intact Sensation intact distally Intact pulses distally Dorsiflexion/Plantar flexion intact Aquacel dressings with scant sanguinous drainage No cellulitis present Compartment soft  LABS  Results for orders placed or performed during the hospital encounter of 02/17/22 (from the past 24 hour(s))  CBC with Differential/Platelet     Status: Abnormal   Collection Time: 02/20/22  4:36 AM  Result Value Ref Range   WBC 5.5 4.0 - 10.5 K/uL   RBC 2.90 (L) 4.22 - 5.81 MIL/uL   Hemoglobin 9.0 (L) 13.0 - 17.0 g/dL   HCT 26.9 (L) 39.0 - 52.0 %   MCV 92.8 80.0 - 100.0 fL   MCH 31.0 26.0 - 34.0 pg   MCHC 33.5 30.0 - 36.0 g/dL   RDW 14.4 11.5 - 15.5 %   Platelets 173 150 - 400 K/uL   nRBC 0.0 0.0 - 0.2 %   Neutrophils Relative % 76 %   Neutro Abs 4.1 1.7 - 7.7 K/uL   Lymphocytes Relative 11 %   Lymphs Abs 0.6 (L) 0.7 - 4.0 K/uL   Monocytes Relative 8 %   Monocytes Absolute 0.5 0.1 - 1.0 K/uL   Eosinophils Relative 5 %   Eosinophils Absolute 0.3 0.0 - 0.5 K/uL   Basophils Relative 0 %   Basophils Absolute 0.0 0.0 - 0.1 K/uL   Immature Granulocytes 0 %   Abs Immature Granulocytes 0.02 0.00 - 0.07 K/uL  Glucose, capillary     Status: Abnormal   Collection Time: 02/20/22  7:47 AM  Result Value Ref Range   Glucose-Capillary 110 (H) 70 - 99 mg/dL   Comment 1 Notify RN    Comment 2 Document in Chart      DG C-Arm 1-60 Min-No Report  Result Date: 02/18/2022 Fluoroscopy was utilized by the requesting physician.  No radiographic interpretation.   DG HIP UNILAT WITH PELVIS 2-3 VIEWS LEFT  Result Date: 02/19/2022 CLINICAL DATA:  Left intramedullary nail and operating room. EXAM: DG HIP (WITH OR WITHOUT PELVIS) 2-3V LEFT COMPARISON:  Pelvis and left hip radiographs 02/17/2022 FINDINGS: Images were performed intraoperatively without the presence of a radiologist. Patient appears to be undergoing intramedullary nail fixation of the previously seen displaced left proximal femoral intertrochanteric fracture. Please see intraoperative findings for further detail. IMPRESSION: Fluoroscopy for left proximal femoral intramedullary nail fixation. Electronically Signed   By: Yvonne Kendall M.D.   On: 02/19/2022 08:10    Assessment/Plan: 2 Days Post-Op   Principal Problem:   Closed left hip fracture (HCC) Active Problems:   Essential hypertension   Hypothyroidism   Schizoaffective disorder, depressive type ([place])   Tardive dyskinesia   Fall   Urine retention   Gout   Normocytic anemia   Chronic diastolic CHF (congestive heart failure) ([place])   Malnutrition of  moderate degree  Continue with therapy OK for discharge from orthopedic standpoint WBAT on the left leg ASA 81 mg BID for 6 weeks upon discharge Follow-up with Dr. Harlow Mares in clinic in 2 weeks for staple removal    Th[place]ton [place] , MD 02/20/2022, 12:31 PM

## 2022-02-20 NOTE — Progress Notes (Signed)
°  Progress Note   Patient: Andres [name] NLZ:[id number] DOB: 10/21/1966 DOA: 02/17/2022     3 DOS: the patient was seen and examined on 02/20/2022   Brief hospital course: Andres [name] is a 56 y.o. male with medical history significant of hypertension, hypothyroidism, gout, anxiety, schizophrenia, developmental delay, tardive dyskinesia, dCHF, who presents with fall and left hip pain.X-ray of left hip and CT scan of left hip showed intertrochanteric fracture of left proximal femur.  Left intramedullary nailing of intertrochanteric was performed on 2/23.  Assessment and Plan:  Left hip fracture. Mechanical fall. Status postsurgery.   Patient is evaluated by PT/OT, still has signal weakness.  Still recommending nursing placement.  Patient was only able to stand and not able to walk.   Schizoaffective disorder, depression type. Tardive dyskinesia. Continue home medicines.  Urinary tension secondary to benign prostate hypertrophy. On Flomax, may consider discontinue catheter tomorrow.   Chronic diastolic congestive heart failure. Stable.     Subjective:  Patient doing well today, still has signal weakness, not able to walk. Appetite has improved, no nausea vomiting.   Physical Exam: Vitals:   02/19/22 1754 02/19/22 2153 02/20/22 0549 02/20/22 0746  BP: (!) 158/97 129/80 121/80 122/79  Pulse: 72 66 80 73  Resp: 18 16 15 15   Temp: 98.2 F (36.8 C) 97.8 F (36.6 C) 98 F (36.7 C) 98 F (36.7 C)  TempSrc:   Axillary   SpO2: 100% 100% 99% 100%  Weight:      Height:       General exam: Appears calm and comfortable  Respiratory system: Clear to auscultation. Respiratory effort normal. Cardiovascular system: S1 & S2 heard, RRR. No JVD, murmurs, rubs, gallops or clicks. No pedal edema. Gastrointestinal system: Abdomen is nondistended, soft and nontender. No organomegaly or masses felt. Normal bowel sounds heard. Central nervous system: Alert and oriented. No focal neurological  deficits. Extremities: Symmetric 5 x 5 power. Skin: No rashes, lesions or ulcers Psychiatry: Judgement and insight appear normal. Mood & affect appropriate.    Data Reviewed: Lab reviewed.  Family Communication:   Disposition: Status is: Inpatient Remains inpatient appropriate because: Unsafe discharge, pending nursing home placement.          Planned Discharge Destination: Skilled nursing facility     Time spent: 26 minutes  Author: , MD 02/20/2022 11:19 AM  For on call review www.02/22/2022.

## 2022-02-20 NOTE — Progress Notes (Signed)
Physical Therapy Treatment Patient Details Name: Andres [name] MRN: [id number] DOB: 1966-03-12 Today's Date: 02/20/2022   History of Present Illness Patient is a 56 year old male s/p Left Intramedullary Nail Intertrochantric surgeon on 02/18/22 due to a left hip fracture. Patient has a PMH (+) for  hypertension, hypothyroidism, gout, anxiety, schizophrenia, developmental delay, tardive dyskinesia, dCHF.    PT Comments    Pt ready for session but voices overall anxiety with movements.  Participated in exercises as described below.  To EOB with min a x 1 and increased time/encouragement.  He is able to stand with mod a x 1 and with encouragement  transfer slowly to chair.  Nursing in to provide +2 assist for moral support.  After short seated rest, stood for pericare as BM was noted in brief with nursing.     Recommendations for follow up therapy are one component of a multi-disciplinary discharge planning process, led by the attending physician.  Recommendations may be updated based on patient status, additional functional criteria and insurance authorization.  Follow Up Recommendations  Skilled nursing-short term rehab (<3 hours/day)     Assistance Recommended at Discharge Frequent or constant Supervision/Assistance  Patient can return home with the following Two people to help with walking and/or transfers;A lot of help with bathing/dressing/bathroom;Assistance with cooking/housework;Assistance with feeding;Two people to help with bathing/dressing/bathroom;Direct supervision/assist for financial management;Direct supervision/assist for medications management;Assist for transportation;Help with stairs or ramp for entrance   Equipment Recommendations  Rolling walker (2 wheels)    Recommendations for Other Services       Precautions / Restrictions Precautions Precautions: Fall Restrictions Weight Bearing Restrictions: Yes LLE Weight Bearing: Weight bearing as tolerated     Mobility   Bed Mobility Overal bed mobility: Needs Assistance Bed Mobility: Supine to Sit     Supine to sit: Min assist          Transfers Overall transfer level: Needs assistance Equipment used: Rolling walker (2 wheels) Transfers: Sit to/from Stand, Bed to chair/wheelchair/BSC Sit to Stand: Min assist, Mod assist   Step pivot transfers: Mod assist, Min assist, +2 physical assistance       General transfer comment: +2 for pt's comfort but only needed +1 for assist.    Ambulation/Gait Ambulation/Gait assistance: Min assist Gait Distance (Feet): 2 Feet Assistive device: Rolling walker (2 wheels) Gait Pattern/deviations: Step-to pattern Gait velocity: decreased     General Gait Details: very small shuffling steps to turn to chair, no true gait.   Stairs             Wheelchair Mobility    Modified Rankin (Stroke Patients Only)       Balance Overall balance assessment: Needs assistance Sitting-balance support: Bilateral upper extremity supported, Single extremity supported Sitting balance-Leahy Scale: Fair     Standing balance support: Bilateral upper extremity supported, During functional activity, Reliant on assistive device for balance Standing balance-Leahy Scale: Poor                              Cognition Arousal/Alertness: Awake/alert Behavior During Therapy: Anxious Overall Cognitive Status: No family/caregiver present to determine baseline cognitive functioning                                 General Comments: alert and oriented x3, follows commands with cues, a bit anxious        Exercises  Other Exercises Other Exercises: supine AAROM x 10    General Comments        Pertinent Vitals/Pain Pain Assessment Pain Assessment: Faces Faces Pain Scale: Hurts a little bit Pain Location: L hip with WBing/standing Pain Descriptors / Indicators: Aching, Grimacing, Guarding Pain Intervention(s): Limited activity within  patient's tolerance, Monitored during session, Premedicated before session    Home Living                          Prior Function            PT Goals (current goals can now be found in the care plan section) Progress towards PT goals: Progressing toward goals    Frequency    7X/week      PT Plan Current plan remains appropriate    Co-evaluation              AM-PAC PT "6 Clicks" Mobility   Outcome Measure  Help needed turning from your back to your side while in a flat bed without using bedrails?: A Little Help needed moving from lying on your back to sitting on the side of a flat bed without using bedrails?: A Little Help needed moving to and from a bed to a chair (including a wheelchair)?: A Lot Help needed standing up from a chair using your arms (e.g., wheelchair or bedside chair)?: A Lot Help needed to walk in hospital room?: Total Help needed climbing 3-5 steps with a railing? : Total 6 Click Score: 12    End of Session Equipment Utilized During Treatment: Gait belt Activity Tolerance: Patient tolerated treatment well;Other (comment) Patient left: in chair;with call bell/phone within reach;with chair alarm set;with nursing/sitter in room Nurse Communication: Mobility status PT Visit Diagnosis: Unsteadiness on feet (R26.81);Muscle weakness (generalized) (M62.81);Difficulty in walking, not elsewhere classified (R26.2);Pain;Repeated falls (R29.6);History of falling (Z91.81) Pain - Right/Left: Left Pain - part of body: Hip     Time: 5465-0354 PT Time Calculation (min) (ACUTE ONLY): 26 min  Charges:  $Therapeutic Exercise: 8-22 mins $Therapeutic Activity: 8-22 mins                    Danielle Dess, PTA 02/20/22, 9:41 AM

## 2022-02-20 NOTE — TOC Initial Note (Signed)
Transition of Care [facility]) - Initial/Assessment Note    Patient Details  Name: Andres [name] MRN: [id number] Date of Birth: [birth year]  [facility]) CM/SW Contact:    Maree Krabbe, LCSW Phone Number: 02/20/2022, 2:58 PM  Clinical Narrative:    CSW spoke with pt and he is agreeable to go to SNF stating "I guess I don't really have a choice." Pt has been to [facility] the past and is agreeable to go there again. CSW has sent referral. PASRR pending.               Expected Discharge Plan: Skilled Nursing Facility Barriers to Discharge: Continued Medical Work up   Patient Goals and CMS Choice Patient states their goals for this hospitalization and ongoing recovery are:: to get better   Choice offered to / list presented to : Patient  Expected Discharge Plan and Services Expected Discharge Plan: Skilled Nursing Facility In-house Referral: Clinical Social Work   Post Acute Care Choice: Skilled Nursing Facility Living arrangements for the past 2 months: Group Home                                      Prior Living Arrangements/Services Living arrangements for the past 2 months: Group Home Lives with:: Self Patient language and need for interpreter reviewed:: Yes Do you feel safe going back to the place where you live?: Yes      Need for Family Participation in Patient Care: Yes (Comment) Care giver support system in place?: Yes (comment)   Criminal Activity/Legal Involvement Pertinent to Current Situation/Hospitalization: No - Comment as needed  Activities of Daily Living Home Assistive Devices/Equipment: None ADL Screening (condition at time of admission) Patient's cognitive ability adequate to safely complete daily activities?: Yes Is the patient deaf or have difficulty hearing?: No Does the patient have difficulty seeing, even when wearing glasses/contacts?: No Does the patient have difficulty concentrating, remembering, or making decisions?: Yes Patient  able to express need for assistance with ADLs?: Yes Does the patient have difficulty dressing or bathing?: Yes Independently performs ADLs?: No Communication: Needs assistance Is this a change from baseline?: Change from baseline, expected to last <3 days Does the patient have difficulty walking or climbing stairs?: Yes Weakness of Legs: Left Weakness of Arms/Hands: Both  Permission Sought/Granted   Permission granted to share information with : Yes, Release of Information Signed  Share Information with NAME: beth     Permission granted to share info w Relationship: sister     Emotional Assessment Appearance:: Appears stated age Attitude/Demeanor/Rapport: Engaged Affect (typically observed): Accepting Orientation: : Oriented to Self, Oriented to Place, Oriented to  Time, Oriented to Situation Alcohol / Substance Use: Not Applicable Psych Involvement: No (comment)  Admission diagnosis:  Leg swelling [M79.89] Fall [W19.XXXA] Closed left hip fracture (HCC) [S72.002A] Closed fracture of left hip, initial encounter [facility]) [S72.002A] Patient Active Problem List   Diagnosis Date Noted   Malnutrition of moderate degree 02/19/2022   Closed left hip fracture (HCC) 02/17/2022   Fall 02/17/2022   Urine retention 02/17/2022   Gout 02/17/2022   Normocytic anemia 02/17/2022   Chronic diastolic CHF (congestive heart failure) (HCC) 02/17/2022   Hypotension 01/06/2022   At risk for prolonged QT interval syndrome 08/04/2021   High risk medication use 08/04/2021   Hypersomnia 08/04/2021   Bereavement 08/04/2021   Lymphedema 04/27/2021   Insomnia due to mental  disorder 12/03/2020   Anxiety disorder 10/08/2020   Chronic venous insufficiency 10/10/2019   Drug-induced parkinsonism (HCC) 10/10/2019   Schizoaffective disorder, depressive type (HCC) 07/05/2019   Tardive dyskinesia 07/05/2019   Loose stools 07/07/2018   Weight loss 06/09/2018   Hypothyroidism 02/28/2018   Vitamin D  deficiency, unspecified 02/28/2018   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   Development delay 01/18/2018   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   Localized edema 09/26/2015   Lack of expected normal physiological development 05/09/2015   FHx: colon cancer 05/09/2015   Essential hypertension 05/09/2015   Constitutional short stature 05/09/2015   Tibia/fibula fracture 08/21/2013   Seasonal allergies 08/21/2013   Allergic rhinitis 08/21/2013   Schizoaffective disorder, in remission (HCC) 05/05/2004   PCP:  Smitty Cords, DO Pharmacy:   [facility] [phone] [place],  - [address] AT [facility] OF [address] [place] [address] [place] [address] Phone: [phone] Fax: [phone]  [id number] - [place], [place] - [address] [place] [address] Phone: [phone] Fax: [phone]  [facility] - [place], [place] - [address] [place] [address] Phone: [phone] Fax: [phone]     Social Determinants of Health (SDOH) Interventions    Readmission Risk Interventions No flowsheet data found.

## 2022-02-21 LAB — GLUCOSE, CAPILLARY: Glucose-Capillary: 119 mg/dL — ABNORMAL HIGH (ref 70–99)

## 2022-02-21 NOTE — TOC Progression Note (Addendum)
Transition of Care [facility]) - Progression Note    Patient Details  Name: Andres [name] MRN: [id number] Date of Birth: 1966/03/09  Transition of Care [facility]) CM/SW Contact  Joseph Art, [place] Phone Number: [phone] 02/21/2022, 2:47 PM  Clinical Narrative:     CSW received confirmation from Tammy at [facility] with bed offer for patient.  Tammy requested patient bring his Ingrezza medication. CSW updated patient's sister Tonette Bihari [phone], who stated she would bring the medication to [facility] when the patient discharges.  TOC CSW will run insurance via [place] for [employer].  CSW explained possible timeline to Ms. Marina Goodell.  Ms. Marina Goodell verbalized understanding.  Attending and Unit RN updated.   Expected Discharge Plan: Skilled Nursing Facility Barriers to Discharge: Continued Medical Work up  Expected Discharge Plan and Services Expected Discharge Plan: Skilled Nursing Facility In-house Referral: Clinical Social Work   Post Acute Care Choice: Skilled Nursing Facility Living arrangements for the past 2 months: Group Home                                       Social Determinants of Health (SDOH) Interventions    Readmission Risk Interventions No flowsheet data found.

## 2022-02-21 NOTE — Progress Notes (Signed)
Physical Therapy Treatment Patient Details Name: Andres [name] MRN: [id number] DOB: 03-19-1966 Today's Date: 02/21/2022   History of Present Illness Patient is a 56 year old male s/p Left Intramedullary Nail Intertrochantric surgeon on 02/18/22 due to a left hip fracture. Patient has a PMH (+) for  hypertension, hypothyroidism, gout, anxiety, schizophrenia, developmental delay, tardive dyskinesia, dCHF.    PT Comments    Participated in exercises as described below.  To EOB with mod a x 1.  Once seated noticed pt inc large loose BM.  Tech in to assist with care. Stood with min a x 2 for care which was improved over yesterday.  He seemed more confident taking steps to chair today and overall less anxious about moving.  Once turned however he generally stiffens his body which affects balance and ability to sit well in chair needing increased assist.  Response seems fear based and not medical concerns.  Mod/max a to transition safely to chair.  Encouraged +2 with staff for pt and staff safety.   Recommendations for follow up therapy are one component of a multi-disciplinary discharge planning process, led by the attending physician.  Recommendations may be updated based on patient status, additional functional criteria and insurance authorization.  Follow Up Recommendations  Skilled nursing-short term rehab (<3 hours/day)     Assistance Recommended at Discharge    Patient can return home with the following Two people to help with walking and/or transfers;A lot of help with bathing/dressing/bathroom;Assistance with cooking/housework;Assistance with feeding;Two people to help with bathing/dressing/bathroom;Direct supervision/assist for financial management;Direct supervision/assist for medications management;Assist for transportation;Help with stairs or ramp for entrance   Equipment Recommendations  Rolling walker (2 wheels)    Recommendations for Other Services       Precautions / Restrictions  Precautions Precautions: Fall Restrictions Weight Bearing Restrictions: Yes LLE Weight Bearing: Weight bearing as tolerated     Mobility  Bed Mobility Overal bed mobility: Needs Assistance Bed Mobility: Supine to Sit Rolling: Mod assist              Transfers Overall transfer level: Needs assistance Equipment used: Rolling walker (2 wheels)   Sit to Stand: Min assist, +2 physical assistance   Step pivot transfers: Mod assist       General transfer comment: recommend +2 assist with nursing staff    Ambulation/Gait   Gait Distance (Feet): 2 Feet Assistive device: Rolling walker (2 wheels) Gait Pattern/deviations: Step-to pattern Gait velocity: decreased     General Gait Details: some improvement in steps but pt becomes more fearful after turning and generally stiffens his body affecting balance and overall safety.   Stairs             Wheelchair Mobility    Modified Rankin (Stroke Patients Only)       Balance Overall balance assessment: Needs assistance Sitting-balance support: Bilateral upper extremity supported, Single extremity supported Sitting balance-Leahy Scale: Fair     Standing balance support: Bilateral upper extremity supported, During functional activity, Reliant on assistive device for balance Standing balance-Leahy Scale: Poor Standing balance comment: does well statically but once stepping needs increased support                            Cognition Arousal/Alertness: Awake/alert Behavior During Therapy: WFL for tasks assessed/performed, Anxious Overall Cognitive Status: No family/caregiver present to determine baseline cognitive functioning  General Comments: less fearful of mobility today        Exercises Other Exercises Other Exercises: supine AAROM x 10    General Comments        Pertinent Vitals/Pain Pain Assessment Pain Assessment: Faces Faces Pain  Scale: Hurts little more Pain Location: L hip with WBing/standing Pain Descriptors / Indicators: Aching, Grimacing, Guarding Pain Intervention(s): Limited activity within patient's tolerance, Monitored during session, Repositioned    Home Living                          Prior Function            PT Goals (current goals can now be found in the care plan section) Progress towards PT goals: Progressing toward goals    Frequency    7X/week      PT Plan Current plan remains appropriate    Co-evaluation              AM-PAC PT "6 Clicks" Mobility   Outcome Measure  Help needed turning from your back to your side while in a flat bed without using bedrails?: A Little Help needed moving from lying on your back to sitting on the side of a flat bed without using bedrails?: A Little Help needed moving to and from a bed to a chair (including a wheelchair)?: A Lot Help needed standing up from a chair using your arms (e.g., wheelchair or bedside chair)?: A Lot Help needed to walk in hospital room?: A Lot Help needed climbing 3-5 steps with a railing? : Total 6 Click Score: 13    End of Session Equipment Utilized During Treatment: Gait belt Activity Tolerance: Patient tolerated treatment well;Other (comment) Patient left: in chair;with call bell/phone within reach;with chair alarm set Nurse Communication: Mobility status PT Visit Diagnosis: Unsteadiness on feet (R26.81);Muscle weakness (generalized) (M62.81);Difficulty in walking, not elsewhere classified (R26.2);Pain;Repeated falls (R29.6);History of falling (Z91.81) Pain - Right/Left: Left Pain - part of body: Hip     Time: 7681-1572 PT Time Calculation (min) (ACUTE ONLY): 13 min  Charges:  $Therapeutic Exercise: 8-22 mins                    Danielle Dess, PTA 02/21/22, 11:10 AM

## 2022-02-21 NOTE — TOC Progression Note (Signed)
Transition of Care [facility]) - Progression Note    Patient Details  Name: ALIE MORTON MRN: [id number] Date of Birth: 06-Jun-1966  Transition of Care [facility]) CM/SW [place], [place] Phone Number: [phone] 02/21/2022, 12:51 PM  Clinical Narrative:     CSW received update from RN stating the patient and his sister did not want [facility] for SNF placement. CSW spoke with patient's sister and [facility] [phone].  Patient does not have a legal guardian. Patient does not have an IDD diagnosis.  CSW spoke with Ms. Perry at length about the difference between [facility] and guardianship, Medicaid application and SNF placement for rehab.  Ms. Henrene Pastor stated the patient currently lives at [facility] [address] [place], [place].    After my conversation about SNF placement with Ms. Henrene Pastor, she stated she would like [facility] to remain on the list of potential SNF placements.   Expected Discharge Plan: [place] Barriers to Discharge: Continued Medical Work up  Expected Discharge Plan and Services Expected Discharge Plan: [place] In-house Referral: Clinical Social Work   Post Acute Care Choice: [place] Living arrangements for the past 2 months: Group Home                                       Social Determinants of Health (SDOH) Interventions    Readmission Risk Interventions No flowsheet data found.

## 2022-02-21 NOTE — TOC Progression Note (Signed)
Transition of Care [facility]) - Progression Note    Patient Details  Name: Andres [name] MRN: [id number] Date of Birth: Jan 02, 1966  Transition of Care [facility]) CM/SW Contact  Maree Krabbe, LCSW Phone Number: 02/21/2022, 3:40 PM  Clinical Narrative:   [facility]erk[facility] Harvey started for [facility].    Expected Discharge Plan: Skilled Nursing Facility Barriers to Discharge: Continued Medical Work up  Expected Discharge Plan and Services Expected Discharge Plan: Skilled Nursing Facility In-house Referral: Clinical Social Work   Post Acute Care Choice: Skilled Nursing Facility Living arrangements for the past 2 months: Group Home                                       Social Determinants of Health (SDOH) Interventions    Readmission Risk Interventions No flowsheet data found.

## 2022-02-21 NOTE — Progress Notes (Signed)
°  Subjective:  POD #3 s/p left hip hemiarthroplasty.   Patient reports left hip pain as mild to moderate when standing or walking.  Patient is up out of bed to a chair.  He was talking on the phone when I arrived.  Objective:   VITALS:   Vitals:   02/20/22 1630 02/20/22 1936 02/21/22 0300 02/21/22 0755  BP: 120/70 133/89 130/80 120/79  Pulse: 90 85 80 96  Resp: 15 20 20 14   Temp: 97.7 F (36.5 C) 98.5 F (36.9 C) 98 F (36.7 C) 97.6 F (36.4 C)  TempSrc:      SpO2: 100% 100% 98% 98%  Weight:      Height:        PHYSICAL EXAM: Left lower extremity: Neurovascular intact Sensation intact distally Intact pulses distally Dorsiflexion/Plantar flexion intact Aquacel dressing: moderate drainage No cellulitis present Compartment soft  LABS  Results for orders placed or performed during the hospital encounter of 02/17/22 (from the past 24 hour(s))  Glucose, capillary     Status: Abnormal   Collection Time: 02/21/22  7:48 AM  Result Value Ref Range   Glucose-Capillary 119 (H) 70 - 99 mg/dL    No results found.  Assessment/Plan: 3 Days Post-Op   Principal Problem:   Closed left hip fracture (HCC) Active Problems:   Essential hypertension   Hypothyroidism   Schizoaffective disorder, depressive type (HCC)   Tardive dyskinesia   Fall   Gout   Normocytic anemia   Chronic diastolic CHF (congestive heart failure) (HCC)   Malnutrition of moderate degree  Continue with physical therapy OK for discharge from orthopedic standpoint when cleared by medicine. WBAT on the left leg ASA 81 mg BID for 6 weeks upon discharge Follow-up with Dr. 02/23/22 in clinic in 2 weeks for staple removal    Andres [name] , MD 02/21/2022, 4:45 PM

## 2022-02-21 NOTE — Progress Notes (Addendum)
°  Progress Note   Patient: Andres [name] PJK:[id number] DOB: Oct 06, 1966 DOA: 02/17/2022     4 DOS: the patient was seen and examined on 02/21/2022   Brief hospital course: DARUIS SWAIM is a 56 y.o. male with medical history significant of hypertension, hypothyroidism, gout, anxiety, schizophrenia, developmental delay, tardive dyskinesia, dCHF, who presents with fall and left hip pain.X-ray of left hip and CT scan of left hip showed intertrochanteric fracture of left proximal femur.  Left intramedullary nailing of intertrochanteric was performed on 2/23.  Assessment and Plan:  Left hip fracture. Mechanical fall. Status postsurgery.   Patient doing well, able to stand up and walk a few steps.  Pending nursing home placement.   Schizoaffective disorder, depression type. Tardive dyskinesia. Continue home medicines.   Chronic diastolic congestive heart failure. Stable.   Patient did not have urinary retention.    Subjective:  Patient doing well today, denies any short of breath or cough. Able to ambulate a few steps.  Physical Exam: Vitals:   02/20/22 1630 02/20/22 1936 02/21/22 0300 02/21/22 0755  BP: 120/70 133/89 130/80 120/79  Pulse: 90 85 80 96  Resp: 15 20 20 14   Temp: 97.7 F (36.5 C) 98.5 F (36.9 C) 98 F (36.7 C) 97.6 F (36.4 C)  TempSrc:      SpO2: 100% 100% 98% 98%  Weight:      Height:       General exam: Appears calm and comfortable  Respiratory system: Clear to auscultation. Respiratory effort normal. Cardiovascular system: S1 & S2 heard, RRR. No JVD, murmurs, rubs, gallops or clicks. No pedal edema. Gastrointestinal system: Abdomen is nondistended, soft and nontender. No organomegaly or masses felt. Normal bowel sounds heard. Central nervous system: Alert and oriented. No focal neurological deficits. Extremities: Symmetric 5 x 5 power. Skin: No rashes, lesions or ulcers Psychiatry: Judgement and insight appear normal. Mood & affect appropriate.     Data Reviewed: Labs reviewed.  Family Communication:   Disposition: Status is: Inpatient Remains inpatient appropriate because: Burden of disease.  Nursing home placement.          Planned Discharge Destination: Skilled nursing facility     Time spent: 23 minutes  Author: , MD 02/21/2022 12:17 PM  For on call review [email or web address]/23/2022.

## 2022-02-22 LAB — CBC WITH DIFFERENTIAL/PLATELET
Abs Immature Granulocytes: 0.02 K/uL (ref 0.00–0.07)
Basophils Absolute: 0 K/uL (ref 0.0–0.1)
Basophils Relative: 0 %
Eosinophils Absolute: 0.4 K/uL (ref 0.0–0.5)
Eosinophils Relative: 8 %
HCT: 25.9 % — ABNORMAL LOW (ref 39.0–52.0)
Hemoglobin: 8.6 g/dL — ABNORMAL LOW (ref 13.0–17.0)
Immature Granulocytes: 1 %
Lymphocytes Relative: 23 %
Lymphs Abs: 1 K/uL (ref 0.7–4.0)
MCH: 30.8 pg (ref 26.0–34.0)
MCHC: 33.2 g/dL (ref 30.0–36.0)
MCV: 92.8 fL (ref 80.0–100.0)
Monocytes Absolute: 0.5 K/uL (ref 0.1–1.0)
Monocytes Relative: 11 %
Neutro Abs: 2.5 K/uL (ref 1.7–7.7)
Neutrophils Relative %: 57 %
Platelets: 174 K/uL (ref 150–400)
RBC: 2.79 MIL/uL — ABNORMAL LOW (ref 4.22–5.81)
RDW: 14.7 % (ref 11.5–15.5)
WBC: 4.4 K/uL (ref 4.0–10.5)
nRBC: 0 % (ref 0.0–0.2)

## 2022-02-22 LAB — GLUCOSE, CAPILLARY: Glucose-Capillary: 95 mg/dL (ref 70–99)

## 2022-02-22 MED ORDER — QUETIAPINE FUMARATE 25 MG PO TABS: 50.0000 mg | ORAL_TABLET | Freq: Every day | ORAL | Status: DC

## 2022-02-22 NOTE — Progress Notes (Signed)
Physical Therapy Treatment Patient Details Name: Andres [name] MRN: [id number] DOB: 05/11/1966 Today's Date: 02/22/2022   History of Present Illness Patient is a 57 year old male s/p Left Intramedullary Nail Intertrochantric surgeon on 02/18/22 due to a left hip fracture. Patient has a PMH (+) for  hypertension, hypothyroidism, gout, anxiety, schizophrenia, developmental delay, tardive dyskinesia, dCHF.    PT Comments    Overlap with OT for mobility for pt and staff safety.  Sitting on EOB with OT upon arrival.  Stood with min a x 2 and is able to take good steps to chair with encouragement.  After short seated rest and encouragement, his is able to stand with mod a x 1 and does take a few forward steps but with decreasing gait quality.  He is only able to take a few steps.  Pt does well with transfer but fear overtakes him affecting gait quality and is is guided to sitting.  He does continue to make daily progress.  Participated in exercises as described below.   Recommendations for follow up therapy are one component of a multi-disciplinary discharge planning process, led by the attending physician.  Recommendations may be updated based on patient status, additional functional criteria and insurance authorization.  Follow Up Recommendations  Skilled nursing-short term rehab (<3 hours/day)     Assistance Recommended at Discharge Frequent or constant Supervision/Assistance  Patient can return home with the following Two people to help with walking and/or transfers;A lot of help with bathing/dressing/bathroom;Assistance with cooking/housework;Assistance with feeding;Two people to help with bathing/dressing/bathroom;Direct supervision/assist for financial management;Direct supervision/assist for medications management;Assist for transportation;Help with stairs or ramp for entrance   Equipment Recommendations  Rolling walker (2 wheels)    Recommendations for Other Services       Precautions  / Restrictions Precautions Precautions: Fall Restrictions Weight Bearing Restrictions: Yes LLE Weight Bearing: Weight bearing as tolerated     Mobility  Bed Mobility Overal bed mobility: Needs Assistance             General bed mobility comments: with OT    Transfers Overall transfer level: Needs assistance Equipment used: Rolling walker (2 wheels) Transfers: Sit to/from Stand Sit to Stand: Min assist, +2 safety/equipment   Step pivot transfers: Min guard, +2 safety/equipment            Ambulation/Gait Ambulation/Gait assistance: Mod assist Gait Distance (Feet): 2 Feet Assistive device: Rolling walker (2 wheels) Gait Pattern/deviations: Step-to pattern           Stairs             Wheelchair Mobility    Modified Rankin (Stroke Patients Only)       Balance Overall balance assessment: Needs assistance Sitting-balance support: Single extremity supported, No upper extremity supported, Feet supported Sitting balance-Leahy Scale: Fair   Postural control: Posterior lean, Right lateral lean Standing balance support: Bilateral upper extremity supported, During functional activity, Reliant on assistive device for balance Standing balance-Leahy Scale: Fair Standing balance comment: initially fair getting from bed to recliner, with increased fear of falling balance does worse with noted posterior lean                            Cognition Arousal/Alertness: Awake/alert Behavior During Therapy: WFL for tasks assessed/performed, Anxious Overall Cognitive Status: History of cognitive impairments - at baseline  General Comments: still somewhat fearful of falling, follows simple commands well        Exercises Other Exercises Other Exercises: seated A/AAROM 2 x 10    General Comments        Pertinent Vitals/Pain Pain Assessment Pain Assessment: Faces Faces Pain Scale: Hurts little more Pain  Location: L hip with WBing/standing Pain Descriptors / Indicators: Grimacing, Guarding Pain Intervention(s): Limited activity within patient's tolerance, Monitored during session, Premedicated before session, Repositioned    Home Living                          Prior Function            PT Goals (current goals can now be found in the care plan section) Progress towards PT goals: Progressing toward goals    Frequency    7X/week      PT Plan Current plan remains appropriate    Co-evaluation PT/OT/SLP Co-Evaluation/Treatment: Yes Reason for Co-Treatment: Complexity of the patient's impairments (multi-system involvement) PT goals addressed during session: Mobility/safety with mobility OT goals addressed during session: ADL's and self-care      AM-PAC PT "6 Clicks" Mobility   Outcome Measure  Help needed turning from your back to your side while in a flat bed without using bedrails?: A Little Help needed moving from lying on your back to sitting on the side of a flat bed without using bedrails?: A Little Help needed moving to and from a bed to a chair (including a wheelchair)?: A Lot Help needed standing up from a chair using your arms (e.g., wheelchair or bedside chair)?: A Lot Help needed to walk in hospital room?: A Lot Help needed climbing 3-5 steps with a railing? : Total 6 Click Score: 13    End of Session Equipment Utilized During Treatment: Gait belt Activity Tolerance: Patient tolerated treatment well;Other (comment) Patient left: in chair;with call bell/phone within reach;with chair alarm set;with family/visitor present Nurse Communication: Mobility status PT Visit Diagnosis: Unsteadiness on feet (R26.81);Muscle weakness (generalized) (M62.81);Difficulty in walking, not elsewhere classified (R26.2);Pain;Repeated falls (R29.6);History of falling (Z91.81) Pain - Right/Left: Left Pain - part of body: Hip     Time: 0454-0981 PT Time Calculation (min)  (ACUTE ONLY): 25 min  Charges:  $Therapeutic Exercise: 8-22 mins $Therapeutic Activity: 8-22 mins                     Danielle Dess, PTA 02/22/22, 12:54 PM

## 2022-02-22 NOTE — Progress Notes (Signed)
Since last night, patient has had increased drainage on post-op dressing of left hip. Reinforced with foam dressing. PA Altamese Cabal paged and MD Martha Clan notified.

## 2022-02-22 NOTE — Progress Notes (Signed)
Occupational Therapy Treatment Patient Details Name: Andres [name] MRN: [id number] DOB: May 22, 1966 Today's Date: 02/22/2022   History of present illness Patient is a 56 year old male s/p Left Intramedullary Nail Intertrochantric surgeon on 02/18/22 due to a left hip fracture. Patient has a PMH (+) for  hypertension, hypothyroidism, gout, anxiety, schizophrenia, developmental delay, tardive dyskinesia, dCHF.   OT comments  Pt seen for OT tx and co-tx with PT to address ADL transfers. Pt's sister present and encouraging throughout. Pt denied pain and did appear anxious at times during session. Encouragement and active listening utilized. Pt completed bed mobility with minimal assist for LLE mgt and with cues/encouragement pt was able to scoot towards the EOB to improve sitting balance without direct assist. Pt set up for grooming tasks on tray table with supv for safety. PTA entered session. Pt required MIN A to stand from EOB with RW and +2 for safety, then took several small steps to the recliner with CGA-MIN A +2 for safety. VC for hand placement to improve descent/safety. OT provided chair follow for additional steps in room. Pt progressing towards goals and continues to benefit from skilled OT services to maximize safety/indep and return to PLOF.    Recommendations for follow up therapy are one component of a multi-disciplinary discharge planning process, led by the attending physician.  Recommendations may be updated based on patient status, additional functional criteria and insurance authorization.    Follow Up Recommendations  Skilled nursing-short term rehab (<3 hours/day)    Assistance Recommended at Discharge Frequent or constant Supervision/Assistance  Patient can return home with the following  A lot of help with bathing/dressing/bathroom;Direct supervision/assist for medications management;Direct supervision/assist for financial management;Assist for transportation;Assistance with  cooking/housework;Help with stairs or ramp for entrance;Two people to help with walking and/or transfers   Equipment Recommendations  BSC/3in1    Recommendations for Other Services      Precautions / Restrictions Precautions Precautions: Fall Restrictions Weight Bearing Restrictions: Yes LLE Weight Bearing: Weight bearing as tolerated       Mobility Bed Mobility Overal bed mobility: Needs Assistance Bed Mobility: Supine to Sit     Supine to sit: Min assist     General bed mobility comments: MIN A to intiate RLE to EOB but then pt able to complete with increased time/effort and heavy use of bed rails    Transfers Overall transfer level: Needs assistance Equipment used: Rolling walker (2 wheels) Transfers: Sit to/from Stand, Bed to chair/wheelchair/BSC Sit to Stand: Min assist, +2 safety/equipment     Step pivot transfers: Min assist     General transfer comment: STS from EOB with MIN A +2 for safety, VC for sequencing and hand placement to improve safety with sitting     Balance Overall balance assessment: Needs assistance Sitting-balance support: Single extremity supported, No upper extremity supported, Feet supported Sitting balance-Leahy Scale: Fair   Postural control: Posterior lean Standing balance support: Bilateral upper extremity supported, During functional activity, Reliant on assistive device for balance Standing balance-Leahy Scale: Fair Standing balance comment: initially fair getting from bed to recliner, with increased fear of falling balance does worse with noted posterior lean                           ADL either performed or assessed with clinical judgement   ADL Overall ADL's : Needs assistance/impaired     Grooming: Sitting;Wash/dry hands;Wash/dry face;Oral care;Set up;Supervision/safety  Extremity/Trunk Assessment              Vision       Perception     Praxis       Cognition Arousal/Alertness: Awake/alert Behavior During Therapy: WFL for tasks assessed/performed, Anxious Overall Cognitive Status: History of cognitive impairments - at baseline                                 General Comments: still somewhat fearful of falling, follows simple commands well        Exercises      Shoulder Instructions       General Comments      Pertinent Vitals/ Pain       Pain Assessment Pain Assessment: Faces Faces Pain Scale: Hurts a little bit Pain Location: L hip with WBing/standing Pain Descriptors / Indicators: Grimacing, Guarding Pain Intervention(s): Limited activity within patient's tolerance, Monitored during session, Premedicated before session, Repositioned  Home Living                                          Prior Functioning/Environment              Frequency  Min 2X/week        Progress Toward Goals  OT Goals(current goals can now be found in the care plan section)  Progress towards OT goals: Progressing toward goals  Acute Rehab OT Goals Patient Stated Goal: get better and be able to walk to [facility] OT Goal Formulation: With patient Time For Goal Achievement: 03/05/22 Potential to Achieve Goals: Good  Plan Discharge plan remains appropriate;Frequency remains appropriate    Co-evaluation    PT/OT/SLP Co-Evaluation/Treatment: Yes Reason for Co-Treatment: For patient/therapist safety;To address functional/ADL transfers PT goals addressed during session: Mobility/safety with mobility;Balance;Proper use of DME OT goals addressed during session: Proper use of Adaptive equipment and DME;ADL's and self-care      AM-PAC OT "6 Clicks" Daily Activity     Outcome Measure   Help from another person eating meals?: None Help from another person taking care of personal grooming?: A Little Help from another person toileting, which includes using toliet, bedpan, or urinal?: A Lot Help  from another person bathing (including washing, rinsing, drying)?: A Lot Help from another person to put on and taking off regular upper body clothing?: A Little Help from another person to put on and taking off regular lower body clothing?: A Lot 6 Click Score: 16    End of Session Equipment Utilized During Treatment: Gait belt;Rolling walker (2 wheels)  OT Visit Diagnosis: Other abnormalities of gait and mobility (R26.89);Muscle weakness (generalized) (M62.81);Pain Pain - Right/Left: Left Pain - part of body: Hip   Activity Tolerance Patient tolerated treatment well   Patient Left in chair;with call bell/phone within reach;with family/visitor present;Other (comment) (seated in recliner with PTA for continued therapy)   Nurse Communication          TimeAR:[id number] OT Time Calculation (min): 21 min  Charges: OT General Charges $OT Visit: 1 Visit OT Treatments $Self Care/Home Management : 8-22 mins  Ardeth Perfect., MPH, MS, OTR/L ascom [phone] 02/22/22, 10:01 AM

## 2022-02-22 NOTE — Progress Notes (Signed)
°  Progress Note   Patient: Andres [name] [id number] DOB: 29-May-1966 DOA: 02/17/2022     5 DOS: the patient was seen and examined on 02/22/2022   Brief hospital course: JACOLBY RISBY is a 56 y.o. male with medical history significant of hypertension, hypothyroidism, gout, anxiety, schizophrenia, developmental delay, tardive dyskinesia, dCHF, who presents with fall and left hip pain.X-ray of left hip and CT scan of left hip showed intertrochanteric fracture of left proximal femur.  Left intramedullary nailing of intertrochanteric was performed on 2/23.  Assessment and Plan: Left hip fracture. Mechanical fall. Status postsurgery.   Patient is medically stable to be discharged, currently pending nursing home placement.   Schizoaffective disorder, depression type. Tardive dyskinesia. No change in treatment plan.   Chronic diastolic congestive heart failure. Stable.        Subjective:  Patient still has significant weakness, not able to walk.  Otherwise doing well, no shortness of breath or cough. No abdominal pain or nausea vomiting  Physical Exam: Vitals:   02/21/22 0755 02/21/22 1945 02/22/22 0000 02/22/22 0433  BP: 120/79 126/78 122/68 130/86  Pulse: 96 88 86 76  Resp: 14 20 17 18   Temp: 97.6 F (36.4 C) 97.8 F (36.6 C) 97.9 F (36.6 C) 98 F (36.7 C)  TempSrc:  Oral Oral Oral  SpO2: 98%   100%  Weight:      Height:       General exam: Appears calm and comfortable  Respiratory system: Clear to auscultation. Respiratory effort normal. Cardiovascular system: S1 & S2 heard, RRR. No JVD, murmurs, rubs, gallops or clicks. No pedal edema. Gastrointestinal system: Abdomen is nondistended, soft and nontender. No organomegaly or masses felt. Normal bowel sounds heard. Central nervous system: Alert and oriented. No focal neurological deficits. Extremities: Symmetric 5 x 5 power. Skin: No rashes, lesions or ulcers Psychiatry: Judgement and insight appear normal. Mood &  affect appropriate.    Data Reviewed: Labs reviewed  Family Communication: Sister updated at the bedside.  Disposition: Status is: Inpatient Remains inpatient appropriate because: Unsafe discharge.          Planned Discharge Destination: Skilled nursing facility     Time spent: 26 minutes  Author: , MD 02/22/2022 11:56 AM  For on call review [email or web address].

## 2022-02-23 LAB — GLUCOSE, CAPILLARY: Glucose-Capillary: 88 mg/dL (ref 70–99)

## 2022-02-23 MED ORDER — OXYCODONE-ACETAMINOPHEN 5-325 MG PO TABS: 1.0000 | ORAL_TABLET | Freq: Four times a day (QID) | ORAL | 0 refills | Status: DC | PRN

## 2022-02-23 MED ORDER — ENOXAPARIN SODIUM 40 MG/0.4ML IJ SOSY: 40.0000 mg | PREFILLED_SYRINGE | INTRAMUSCULAR | 0 refills | Status: DC

## 2022-02-23 NOTE — Discharge Summary (Signed)
Physician Discharge Summary   Patient: Andres [name] MRN: [id number] DOB: [birth year]  Admit date:     02/17/2022  Discharge date: 02/23/22  Discharge Physician: Marrion Coy   PCP: Smitty Cords, DO   Recommendations at discharge:   Follow-up with orthopedics in 2 weeks.  Discharge Diagnoses: Principal Problem:   Closed left hip fracture (HCC) Active Problems:   Essential hypertension   Hypothyroidism   Schizoaffective disorder, depressive type (HCC)   Tardive dyskinesia   Fall   Gout   Normocytic anemia   Chronic diastolic CHF (congestive heart failure) (HCC)   Malnutrition of moderate degree  Resolved Problems:   * No resolved hospital problems. *   Hospital Course: THA [name] is a 56 y.o. male with medical history significant of hypertension, hypothyroidism, gout, anxiety, schizophrenia, developmental delay, tardive dyskinesia, dCHF, who presents with fall and left hip pain.X-ray of left hip and CT scan of left hip showed intertrochanteric fracture of left proximal femur.  Left intramedullary nailing of intertrochanteric was performed on 2/23.  Assessment and Plan: Left hip fracture. Mechanical fall. Status postsurgery.   Patient is medically stable to be discharged, accepted to nursing home.   Schizoaffective disorder, depression type. Tardive dyskinesia. No change in treatment    Chronic diastolic congestive heart failure. Stable.         Consultants: Orthopedics. Procedures performed: Hip surgery Disposition: Skilled nursing facility Diet recommendation:  Discharge Diet Orders (From admission, onward)     Start     Ordered   02/23/22 0000  Diet - low sodium heart healthy        02/23/22 0942           Cardiac diet  DISCHARGE MEDICATION: Allergies as of 02/23/2022       Reactions   Prednisone Other (See Comments)   Did not like the way it made him feel  Did not like the way it made him feel    Lamotrigine Rash         Medication List     STOP taking these medications    amoxicillin 500 MG tablet Commonly known as: AMOXIL   azelastine 0.1 % nasal spray Commonly known as: ASTELIN       TAKE these medications    allopurinol 100 MG tablet Commonly known as: ZYLOPRIM TAKE 1 TABLET(100 MG) BY MOUTH DAILY   cholecalciferol 1000 units tablet Commonly known as: VITAMIN D Take 1,000 Units by mouth daily.   citalopram 40 MG tablet Commonly known as: CELEXA TAKE 1 TABLET(40 MG) BY MOUTH DAILY   docusate sodium 100 MG capsule Commonly known as: COLACE Take 100 mg by mouth daily.   enoxaparin 40 MG/0.4ML injection Commonly known as: LOVENOX Inject 0.4 mLs (40 mg total) into the skin daily for 14 days.   fexofenadine 180 MG tablet Commonly known as: ALLEGRA Take 180 mg by mouth.   ibuprofen 200 MG tablet Commonly known as: ADVIL Take 200 mg by mouth 2 (two) times daily with a meal.   levothyroxine 25 MCG tablet Commonly known as: SYNTHROID TAKE 1 TABLET BY MOUTH DAILY BEFORE BREAKFAST ON AN EMPTY STOMACH( NOT TO TAKE WITH OTHER MEDICINES)   ondansetron 4 MG tablet Commonly known as: ZOFRAN Take by mouth.   oxyCODONE-acetaminophen 5-325 MG tablet Commonly known as: PERCOCET/ROXICET Take 1 tablet by mouth every 6 (six) hours as needed for moderate pain.   propranolol 10 MG tablet Commonly known as: INDERAL Take 5 mg by mouth 2 (two) times  daily. For severe anxiety/agitation   QUEtiapine 25 MG tablet Commonly known as: SEROquel Take 3 tablets (75 mg total) by mouth at bedtime. What changed:  how much to take when to take this Another medication with the same name was removed. Continue taking this medication, and follow the directions you see here.   thiamine 100 MG tablet Commonly known as: Vitamin B-1 Take 100 mg by mouth daily.   traZODone 50 MG tablet Commonly known as: DESYREL Take 1 tablet (50 mg total) by mouth at bedtime. Dose change   valbenazine 80 MG  capsule Commonly known as: Ingrezza Take 1 capsule (80 mg total) by mouth at bedtime.               Discharge Care Instructions  (From admission, onward)           Start     Ordered   02/23/22 0000  Discharge wound care:       Comments: Follow with orthopedics   02/23/22 0037            Follow-up Information     Juanell Fairly, MD Follow up in 2 week(s).   Specialty: Orthopedic Surgery Contact information: [address] [place] [address] [phone]                 Discharge Exam: Ceasar Mons Weights   02/18/22 1534  Weight: 62.1 kg   General exam: Appears calm and comfortable  Respiratory system: Clear to auscultation. Respiratory effort normal. Cardiovascular system: S1 & S2 heard, RRR. No JVD, murmurs, rubs, gallops or clicks. No pedal edema. Gastrointestinal system: Abdomen is nondistended, soft and nontender. No organomegaly or masses felt. Normal bowel sounds heard. Central nervous system: Alert and oriented. No focal neurological deficits. Extremities: Symmetric 5 x 5 power. Skin: No rashes, lesions or ulcers Psychiatry: Judgement and insight appear normal. Mood & affect appropriate.     Condition at discharge: good  The results of significant diagnostics from this hospitalization (including imaging, microbiology, ancillary and laboratory) are listed below for reference.   Imaging Studies: DG Chest 1 View  Result Date: 02/17/2022 CLINICAL DATA:  LEFT hip and knee pain post fall EXAM: CHEST  1 VIEW COMPARISON:  06/17/2019 FINDINGS: Normal heart size, mediastinal contours, and pulmonary vascularity. Lungs clear. No infiltrate, pleural effusion, or pneumothorax. Bones demineralized. IMPRESSION: No acute abnormalities. Electronically Signed   By: Ulyses Southward M.D.   On: 02/17/2022 12:27   CT Head Wo Contrast  Result Date: 02/17/2022 CLINICAL DATA:  Patient with fall. EXAM: CT HEAD WITHOUT CONTRAST TECHNIQUE: Contiguous axial images  were obtained from the base of the skull through the vertex without intravenous contrast. RADIATION DOSE REDUCTION: This exam was performed according to the departmental dose-optimization program which includes automated exposure control, adjustment of the mA and/or kV according to patient size and/or use of iterative reconstruction technique. COMPARISON:  Brain CT 01/20/2022. FINDINGS: Brain: Ventricles and sulci are appropriate for patient's age. No evidence for acute cortical based infarct, intracranial hemorrhage, mass lesion or mass effect. Vascular: Unremarkable Skull: Intact. Sinuses/Orbits: Paranasal sinuses are well aerated. Mastoid air cells are unremarkable. Other: None. IMPRESSION: No acute intracranial process. Electronically Signed   By: Annia Belt M.D.   On: 02/17/2022 15:17   CT Hip Left Wo Contrast  Result Date: 02/17/2022 CLINICAL DATA:  Left hip fracture EXAM: CT OF THE LEFT HIP WITHOUT CONTRAST TECHNIQUE: Multidetector CT imaging of the left hip was performed according to the standard protocol. Multiplanar CT  image reconstructions were also generated. RADIATION DOSE REDUCTION: This exam was performed according to the departmental dose-optimization program which includes automated exposure control, adjustment of the mA and/or kV according to patient size and/or use of iterative reconstruction technique. COMPARISON:  X-ray 02/17/2022 FINDINGS: Bones/Joint/Cartilage Subacute comminuted intratrochanteric fracture of the proximal left femur with varus angulation and anterior displacement. There is developing callus formation across the fracture site although no bridging bone formation at this time. Femoral head remains aligned within the acetabulum without dislocation. Visualized portion of the left hemipelvis is intact. Left SI joint and pubic symphysis are intact without diastasis. Ligaments Suboptimally assessed by CT. Muscles and Tendons Fluid tracking along the course of the left iliopsoas  tendon which could be related to posttraumatic hematoma or bursitis. Soft tissues Diffuse soft tissue edema. Presacral fluid/edema. Moderately distended urinary bladder. Moderate-large amount of fluid within the right inguinal canal. Partially visualized bilateral hydroceles. IMPRESSION: 1. Subacute comminuted intratrochanteric fracture of the proximal left femur, as described. 2. Fluid tracking along the course of the left iliopsoas tendon which could be related to posttraumatic hematoma or bursitis. 3. Moderate-large amount of fluid within the right inguinal canal. 4. Moderate distension of the urinary bladder. Correlate for retention or outlet obstruction. 5. Presacral fluid/edema, nonspecific. Electronically Signed   By: Duanne Guess D.O.   On: 02/17/2022 13:07   US Venous Img Lower Bilateral (DVT)  Result Date: 02/18/2022 CLINICAL DATA:  Bilateral lower extremity edema. Recent acute intratrochanteric fracture of the left hip. EXAM: BILATERAL LOWER EXTREMITY VENOUS DOPPLER ULTRASOUND TECHNIQUE: Gray-scale sonography with graded compression, as well as color Doppler and duplex ultrasound were performed to evaluate the lower extremity deep venous systems from the level of the common femoral vein and including the common femoral, femoral, profunda femoral, popliteal and calf veins including the posterior tibial, peroneal and gastrocnemius veins when visible. The superficial great saphenous vein was also interrogated. Spectral Doppler was utilized to evaluate flow at rest and with distal augmentation maneuvers in the common femoral, femoral and popliteal veins. COMPARISON:  None. FINDINGS: RIGHT LOWER EXTREMITY Common Femoral Vein: No evidence of thrombus. Normal compressibility, respiratory phasicity and response to augmentation. Saphenofemoral Junction: No evidence of thrombus. Normal compressibility and flow on color Doppler imaging. Profunda Femoral Vein: No evidence of thrombus. Normal compressibility  and flow on color Doppler imaging. Femoral Vein: No evidence of thrombus. Normal compressibility, respiratory phasicity and response to augmentation. Popliteal Vein: No evidence of thrombus. Normal compressibility, respiratory phasicity and response to augmentation. Calf Veins: No evidence of thrombus. Normal compressibility and flow on color Doppler imaging. Superficial Great Saphenous Vein: No evidence of thrombus. Normal compressibility. Venous Reflux:  None. Other Findings: No evidence of superficial thrombophlebitis or abnormal fluid collection. LEFT LOWER EXTREMITY Common Femoral Vein: No evidence of thrombus. Normal compressibility, respiratory phasicity and response to augmentation. Saphenofemoral Junction: No evidence of thrombus. Normal compressibility and flow on color Doppler imaging. Profunda Femoral Vein: No evidence of thrombus. Normal compressibility and flow on color Doppler imaging. Femoral Vein: No evidence of thrombus. Normal compressibility, respiratory phasicity and response to augmentation. Popliteal Vein: No evidence of thrombus. Normal compressibility, respiratory phasicity and response to augmentation. Calf Veins: No evidence of thrombus. Normal compressibility and flow on color Doppler imaging. Superficial Great Saphenous Vein: No evidence of thrombus. Normal compressibility. Venous Reflux:  None. Other Findings: No evidence of superficial thrombophlebitis or abnormal fluid collection. IMPRESSION: No evidence of deep venous thrombosis in either lower extremity. Electronically Signed   By: Sherrine Maples  Fredia SorrowYamagata M.D.   On: 02/18/2022 11:23   DG Knee Complete 4 Views Left  Result Date: 02/17/2022 CLINICAL DATA:  Fall, did not have a walker, esp on 4 times since the end of last week, LEFT knee LEFT hip pain EXAM: LEFT KNEE - COMPLETE 4+ VIEW COMPARISON:  None FINDINGS: Osseous demineralization. Soft tissue swelling LEFT knee region. Minimal joint space narrowing medial compartment and  patellofemoral joint. No acute fracture, dislocation, or bone destruction. No joint effusion. Mature periosteal new bone versus callus at margin of exam at LEFT fibular diaphysis, question old fracture; incompletely assessed. IMPRESSION: Osseous demineralization and minimal degenerative changes LEFT knee. No acute osseous abnormalities. Question healing fracture of mid LEFT fibular diaphysis; recommend correlation with patient history and exclusion of pain this site to exclude acute injury, and consider dedicated tibial/fibular radiographs if symptomatic at this site. Electronically Signed   By: Ulyses SouthwardMark  Boles M.D.   On: 02/17/2022 12:24   DG C-Arm 1-60 Min-No Report  Result Date: 02/18/2022 Fluoroscopy was utilized by the requesting physician.  No radiographic interpretation.   DG HIP UNILAT WITH PELVIS 2-3 VIEWS LEFT  Result Date: 02/19/2022 CLINICAL DATA:  Left intramedullary nail and operating room. EXAM: DG HIP (WITH OR WITHOUT PELVIS) 2-3V LEFT COMPARISON:  Pelvis and left hip radiographs 02/17/2022 FINDINGS: Images were performed intraoperatively without the presence of a radiologist. Patient appears to be undergoing intramedullary nail fixation of the previously seen displaced left proximal femoral intertrochanteric fracture. Please see intraoperative findings for further detail. IMPRESSION: Fluoroscopy for left proximal femoral intramedullary nail fixation. Electronically Signed   By: Neita Garnetonald  Viola M.D.   On: 02/19/2022 08:10   DG Hip Unilat W or Wo Pelvis 2-3 Views Left  Result Date: 02/17/2022 CLINICAL DATA:  Pain, fall EXAM: DG HIP (WITH OR WITHOUT PELVIS) 2-3V LEFT COMPARISON:  Pelvic radiograph 06/17/2019 FINDINGS: Osseous demineralization. Comminuted displaced intertrochanteric fracture LEFT femur with slight varus angulation. Minimal callus noted. No dislocation. Bony pelvis intact. Post ORIF of proximal RIGHT femur. IMPRESSION: Comminuted displaced intertrochanteric fracture LEFT femur with  slight varus angulation. Minimal callus is seen, suggesting this is subacute. Osseous demineralization with prior ORIF proximal RIGHT femur. Electronically Signed   By: Ulyses SouthwardMark  Boles M.D.   On: 02/17/2022 12:26    Microbiology: Results for orders placed or performed during the hospital encounter of 02/17/22  Resp Panel by RT-PCR (Flu A&B, Covid) Nasopharyngeal Swab     Status: None   Collection Time: 02/17/22  6:03 PM   Specimen: Nasopharyngeal Swab; Nasopharyngeal(NP) swabs in vial transport medium  Result Value Ref Range Status   SARS Coronavirus 2 by RT PCR NEGATIVE NEGATIVE Final    Comment: (NOTE) SARS-CoV-2 target nucleic acids are NOT DETECTED.  The SARS-CoV-2 RNA is generally detectable in upper respiratory specimens during the acute phase of infection. The lowest concentration of SARS-CoV-2 viral copies this assay can detect is 138 copies/mL. A negative result does not preclude SARS-Cov-2 infection and should not be used as the sole basis for treatment or other patient management decisions. A negative result may occur with  improper specimen collection/handling, submission of specimen other than nasopharyngeal swab, presence of viral mutation(s) within the areas targeted by this assay, and inadequate number of viral copies(<138 copies/mL). A negative result must be combined with clinical observations, patient history, and epidemiological information. The expected result is Negative.  Fact Sheet for Patients:  BloggerCourse.comhttps://www.fda.gov/media/152166/download  Fact Sheet for Healthcare Providers:  SeriousBroker.ithttps://www.fda.gov/media/152162/download  This test is no t yet approved or cleared  by the [place] FDA and  has been authorized for detection and/or diagnosis of SARS-CoV-2 by FDA under an Emergency Use Authorization (EUA). This EUA will remain  in effect (meaning this test can be used) for the duration of the COVID-19 declaration under Section 564(b)(1) of the Act, 21 U.S.C.section  360bbb-3(b)(1), unless the authorization is terminated  or revoked sooner.       Influenza A by PCR NEGATIVE NEGATIVE Final   Influenza B by PCR NEGATIVE NEGATIVE Final    Comment: (NOTE) The Xpert Xpress SARS-CoV-2/FLU/RSV plus assay is intended as an aid in the diagnosis of influenza from Nasopharyngeal swab specimens and should not be used as a sole basis for treatment. Nasal washings and aspirates are unacceptable for Xpert Xpress SARS-CoV-2/FLU/RSV testing.  Fact Sheet for Patients: BloggerCourse.comhttps://www.fda.gov/media/152166/download  Fact Sheet for Healthcare Providers: SeriousBroker.ithttps://www.fda.gov/media/152162/download  This test is not yet approved or cleared by the [place] FDA and has been authorized for detection and/or diagnosis of SARS-CoV-2 by FDA under an Emergency Use Authorization (EUA). This EUA will remain in effect (meaning this test can be used) for the duration of the COVID-19 declaration under Section 564(b)(1) of the Act, 21 U.S.C. section 360bbb-3(b)(1), unless the authorization is terminated or revoked.  Performed at [facility], [address] [place], [place] [address]     Labs: CBC: Recent Labs  Lab 02/17/22 1230 02/18/22 0313 02/19/22 0532 02/20/22 0436 02/22/22 0515  WBC 5.0 5.0 5.0 5.5 4.4  NEUTROABS 3.2  --  3.9 4.1 2.5  HGB 9.4* 9.1* 9.0* 9.0* 8.6*  HCT 27.9* 27.2* 26.3* 26.9* 25.9*  MCV 92.1 92.2 90.4 92.8 92.8  PLT 153 152 204 173 174   Basic Metabolic Panel: Recent Labs  Lab 02/17/22 1230 02/18/22 0313 02/19/22 0532  NA 141  --  142  K 3.9  --  3.7  CL 112*  --  111  CO2 21*  --  21*  GLUCOSE 108*  --  142*  BUN 28*  --  24*  CREATININE 0.90 0.92 0.91  CALCIUM 8.4*  --  8.4*  MG  --   --  1.9   Liver Function Tests: Recent Labs  Lab 02/17/22 1230  AST 24  ALT 16  ALKPHOS 46  BILITOT 1.2  PROT 5.3*  ALBUMIN 3.1*   CBG: Recent Labs  Lab 02/19/22 0805 02/20/22 0747 02/21/22 0748 02/22/22 0811  02/23/22 0813  GLUCAP 124* 110* 119* 95 88    Discharge time spent: less than 30 minutes.  Signed: Marrion Coyekui Caitrin Pendergraph, MD [facility] 02/23/2022

## 2022-02-23 NOTE — TOC Transition Note (Signed)
Transition of Care [facility]) - CM/SW Discharge Note   Patient Details  Name: CONNER MUEGGE MRN: [id number] Date of Birth: 11/17/1966  Transition of Care [facility]) CM/SW Contact:  Maree Krabbe, LCSW Phone Number: 02/23/2022, 10:58 AM   Clinical Narrative:   Clinical Social Worker facilitated patient discharge including contacting patient family and facility to confirm patient discharge plans.  Clinical information faxed to facility and family agreeable with plan.  CSW arranged ambulance transport via [facility] to [facility] room 805 .  RN to call [phone] for report prior to discharge.      Final next level of care: Skilled Nursing Facility Barriers to Discharge: No Barriers Identified   Patient Goals and CMS Choice Patient states their goals for this hospitalization and ongoing recovery are:: to get better   Choice offered to / list presented to : Patient  Discharge Placement              Patient chooses bed at:  ([facility]) Patient to be transferred to facility by: [facility]   Patient and family notified of of transfer: 02/23/22  Discharge Plan and Services In-house Referral: Clinical Social Work   Post Acute Care Choice: Skilled Nursing Facility                               Social Determinants of Health (SDOH) Interventions     Readmission Risk Interventions No flowsheet data found.

## 2022-02-23 NOTE — Care Management Important Message (Signed)
Important Message  Patient Details  Name: Andres [name] MRN: [id number] Date of Birth: 01/28/1966   Medicare Important Message Given:  Yes     Olegario Messier A Lusine Corlett 02/23/2022, 10:45 AM

## 2022-02-23 NOTE — Progress Notes (Signed)
°  Subjective:  Patient reports pain as mild.  Up in chair.  Objective:   VITALS:   Vitals:   02/22/22 2150 02/23/22 0015 02/23/22 0400 02/23/22 0806  BP: (!) 129/97 126/80 122/78 113/70  Pulse: 100 98 97 86  Resp: 18 18 16 16   Temp: (!) 97.5 F (36.4 C) 97.9 F (36.6 C) 97.8 F (36.6 C) 98.2 F (36.8 C)  TempSrc:  Oral Oral   SpO2: 98% 98% 98% 99%  Weight:      Height:        PHYSICAL EXAM:  ABD soft Sensation intact distally Dorsiflexion/Plantar flexion intact Incision: moderate drainage No cellulitis present Compartment soft  LABS  Results for orders placed or performed during the hospital encounter of 02/17/22 (from the past 24 hour(s))  Glucose, capillary     Status: None   Collection Time: 02/23/22  8:13 AM  Result Value Ref Range   Glucose-Capillary 88 70 - 99 mg/dL    No results found.  Assessment/Plan: 5 Days Post-Op   Principal Problem:   Closed left hip fracture (HCC) Active Problems:   Essential hypertension   Hypothyroidism   Schizoaffective disorder, depressive type (HCC)   Tardive dyskinesia   Fall   Gout   Normocytic anemia   Chronic diastolic CHF (congestive heart failure) (HCC)   Malnutrition of moderate degree   Up with therapy Discharge to SNF Dressing changed RTC 10 to 12 days for staple removal ASA for DVT prophylaxis    02/25/22 , MD 02/23/2022, 12:23 PM

## 2022-02-23 NOTE — Progress Notes (Signed)
Contacted [facility]. Reported to Charley, LPN °

## 2022-02-23 NOTE — Progress Notes (Signed)
Physical Therapy Treatment Patient Details Name: Andres [name] MRN: [id number] DOB: March 09, 1966 Today's Date: 02/23/2022   History of Present Illness Patient is a 56 year old male s/p Left Intramedullary Nail Intertrochantric surgeon on 02/18/22 due to a left hip fracture. Patient has a PMH (+) for  hypertension, hypothyroidism, gout, anxiety, schizophrenia, developmental delay, tardive dyskinesia, dCHF.    PT Comments    Pt was pleasant and motivated to participate during the session. Pt required min A with bed mobility to manage the LLE but put forth good effort.  Pt was able to come to standing with increased effort but did not require physical assist.  Once in standing pt was limited by LLE pain but was still able to take several small steps at the EOB and to the chair with min A to guide the RW and with cues for sequencing.  Pt will benefit from PT services in a SNF setting upon discharge to safely address deficits listed in patient problem list for decreased caregiver assistance and eventual return to PLOF.      Recommendations for follow up therapy are one component of a multi-disciplinary discharge planning process, led by the attending physician.  Recommendations may be updated based on patient status, additional functional criteria and insurance authorization.  Follow Up Recommendations  Skilled nursing-short term rehab (<3 hours/day)     Assistance Recommended at Discharge Frequent or constant Supervision/Assistance  Patient can return home with the following Two people to help with walking and/or transfers;A lot of help with bathing/dressing/bathroom;Assistance with cooking/housework;Assistance with feeding;Two people to help with bathing/dressing/bathroom;Direct supervision/assist for financial management;Direct supervision/assist for medications management;Assist for transportation;Help with stairs or ramp for entrance   Equipment Recommendations  Rolling walker (2 wheels)     Recommendations for Other Services       Precautions / Restrictions Precautions Precautions: Fall Restrictions LLE Weight Bearing: Weight bearing as tolerated     Mobility  Bed Mobility Overal bed mobility: Needs Assistance Bed Mobility: Supine to Sit     Supine to sit: Min assist     General bed mobility comments: Min A with LLE control and cues for sequencing    Transfers Overall transfer level: Needs assistance Equipment used: Rolling walker (2 wheels) Transfers: Sit to/from Stand Sit to Stand: Min guard           General transfer comment: Mod verbal cues for hand placement and increased trunk flexion    Ambulation/Gait Ambulation/Gait assistance: Min assist Gait Distance (Feet): 3 Feet Assistive device: Rolling walker (2 wheels) Gait Pattern/deviations: Step-to pattern, Antalgic, Decreased stance time - left Gait velocity: decreased     General Gait Details: Mod verbal cues for sequencing for step-to pattern for LLE pain control;  min A to guide the RW   Stairs             Wheelchair Mobility    Modified Rankin (Stroke Patients Only)       Balance Overall balance assessment: Needs assistance Sitting-balance support: Feet supported, Bilateral upper extremity supported Sitting balance-Leahy Scale: Fair     Standing balance support: Bilateral upper extremity supported, During functional activity, Reliant on assistive device for balance Standing balance-Leahy Scale: Fair                              Cognition Arousal/Alertness: Awake/alert Behavior During Therapy: WFL for tasks assessed/performed, Anxious Overall Cognitive Status: History of cognitive impairments - at baseline  Exercises Total Joint Exercises Ankle Circles/Pumps: AROM, Strengthening, Both, 10 reps Quad Sets: Strengthening, Both, 10 reps Gluteal Sets: Strengthening, Both, 10 reps Heel Slides: AAROM,  Both, 5 reps, Strengthening Hip ABduction/ADduction: AAROM, Strengthening, Both, 5 reps Straight Leg Raises: AAROM, Strengthening, Both, 5 reps Long Arc Quad: AROM, Strengthening, Both, 10 reps    General Comments        Pertinent Vitals/Pain Pain Assessment Pain Assessment: 0-10 Pain Score: 3  Pain Location: L hip Pain Descriptors / Indicators: Aching, Sore Pain Intervention(s): Repositioned, Premedicated before session, Monitored during session    Home Living                          Prior Function            PT Goals (current goals can now be found in the care plan section) Progress towards PT goals: Progressing toward goals    Frequency    7X/week      PT Plan Current plan remains appropriate    Co-evaluation              AM-PAC PT "6 Clicks" Mobility   Outcome Measure  Help needed turning from your back to your side while in a flat bed without using bedrails?: A Little Help needed moving from lying on your back to sitting on the side of a flat bed without using bedrails?: A Little Help needed moving to and from a bed to a chair (including a wheelchair)?: A Little Help needed standing up from a chair using your arms (e.g., wheelchair or bedside chair)?: A Little Help needed to walk in hospital room?: A Lot Help needed climbing 3-5 steps with a railing? : Total 6 Click Score: 15    End of Session Equipment Utilized During Treatment: Gait belt Activity Tolerance: Patient tolerated treatment well Patient left: in chair;with call bell/phone within reach;with chair alarm set Nurse Communication: Mobility status PT Visit Diagnosis: Unsteadiness on feet (R26.81);Muscle weakness (generalized) (M62.81);Difficulty in walking, not elsewhere classified (R26.2);Pain;Repeated falls (R29.6);History of falling (Z91.81) Pain - Right/Left: Left Pain - part of body: Hip     Time: [id number] PT Time Calculation (min) (ACUTE ONLY): 23 min  Charges:  $Gait  Training: 8-22 mins $Therapeutic Exercise: 8-22 mins                     D. Scott Hila Bolding PT, DPT 02/23/22, 11:30 AM

## 2022-03-04 ENCOUNTER — Encounter: Payer: Self-pay | Admitting: Orthopedic Surgery

## 2022-04-29 ENCOUNTER — Ambulatory Visit (INDEPENDENT_AMBULATORY_CARE_PROVIDER_SITE_OTHER): Payer: Medicare Other | Admitting: Nurse Practitioner

## 2022-04-29 ENCOUNTER — Encounter (INDEPENDENT_AMBULATORY_CARE_PROVIDER_SITE_OTHER): Payer: Self-pay | Admitting: Nurse Practitioner

## 2022-04-29 VITALS — BP 120/75 | HR 64 | Resp 16 | Wt 131.8 lb

## 2022-04-29 DIAGNOSIS — I890 Lymphedema, not elsewhere classified: Secondary | ICD-10-CM | POA: Diagnosis not present

## 2022-04-29 DIAGNOSIS — I10 Essential (primary) hypertension: Secondary | ICD-10-CM

## 2022-05-14 ENCOUNTER — Encounter (INDEPENDENT_AMBULATORY_CARE_PROVIDER_SITE_OTHER): Payer: Self-pay | Admitting: Nurse Practitioner

## 2022-05-14 NOTE — Progress Notes (Signed)
Subjective:    Patient ID: Andres [name], male    DOB: Aug 24, 1966, 56 y.o.   MRN: [id number] Chief Complaint  Patient presents with   Follow-up    1 yr follow up    The patient returns today for follow-up with noninvasive studies regarding her lower extremity edema.  The patient is a somewhat poor historian however his sister is able to help with history as well as redirection.  Since last office visit the patient has obtained a lymphedema pump but he has also underwent numerous changes such as a recent broken hip and transition to a group home.  Based on some of the changes his swelling is somewhat worse than it has been previously.  There are no open wounds or ulcerations    Review of Systems  Cardiovascular:  Positive for leg swelling.  Musculoskeletal:  Positive for gait problem.  All other systems reviewed and are negative.     Objective:   Physical Exam Vitals reviewed.  HENT:     Head: Normocephalic.  Cardiovascular:     Rate and Rhythm: Normal rate.  Pulmonary:     Effort: Pulmonary effort is normal.  Musculoskeletal:     Right lower leg: Edema present.     Left lower leg: Edema present.  Skin:    General: Skin is warm and dry.  Neurological:     Mental Status: He is alert and oriented to person, place, and time.     Gait: Gait abnormal.  Psychiatric:        Attention and Perception: He is inattentive.        Mood and Affect: Affect is labile.        Speech: Speech normal.        Behavior: Behavior is agitated.        Cognition and Memory: Cognition is impaired. He exhibits impaired recent memory.        Judgment: Judgment is impulsive.    BP 120/75 (BP Location: Right Arm)   Pulse 64   Resp 16   Wt 131 lb 12.8 oz (59.8 kg)   BMI 21.27 kg/m   Past Medical History:  Diagnosis Date   Allergy    Essential hypertension    Hypothyroidism    Tardive dyskinesia     Social History   Socioeconomic History   Marital status: Single    Spouse name: Not on  file   Number of children: 0   Years of education: High School   Highest education level: High school graduate  Occupational History    Comment: disabled  Tobacco Use   Smoking status: Never   Smokeless tobacco: Never  Vaping Use   Vaping Use: Never used  Substance and Sexual Activity   Alcohol use: No   Drug use: No   Sexual activity: Not Currently  Other Topics Concern   Not on file  Social History Narrative   Lives with parents, mental handicap, uses walker   Social Determinants of Health   Financial Resource Strain: Not on file  Food Insecurity: Not on file  Transportation Needs: Not on file  Physical Activity: Not on file  Stress: Not on file  Social Connections: Not on file  Intimate Partner Violence: Not on file    Past Surgical History:  Procedure Laterality Date   ANKLE SURGERY     COLON SURGERY     HERNIA REPAIR     INTRAMEDULLARY (IM) NAIL INTERTROCHANTERIC Right 06/18/2019   Procedure: INTRAMEDULLARY (IM)  NAIL INTERTROCHANTRIC;  Surgeon: [name], [name], MD;  Location: [facility];  Service: Orthopedics;  Laterality: Right;   INTRAMEDULLARY (IM) NAIL INTERTROCHANTERIC Left 02/18/2022   Procedure: INTRAMEDULLARY (IM) NAIL INTERTROCHANTRIC;  Surgeon: [name], [name], MD;  Location: [facility];  Service: Orthopedics;  Laterality: Left;   TONSILLECTOMY      Family History  Problem Relation Age of Onset   Diabetes Mother    Stroke Mother    Cancer Father        colon   Colon cancer Father    Colon cancer Other    Mental illness Neg Hx     Allergies  Allergen Reactions   Prednisone Other (See Comments)    Did not like the way it made him feel  Did not like the way it made him feel    Lamotrigine Rash       Latest Ref Rng & Units 02/22/2022    5:15 AM 02/20/2022    4:36 AM 02/19/2022    5:32 AM  CBC  WBC 4.0 - 10.5 K/uL 4.4   5.5   5.0    Hemoglobin 13.0 - 17.0 g/dL 8.6   9.0   9.0    Hematocrit 39.0 - 52.0 % 25.9   26.9   26.3    Platelets 150 - 400  K/uL 174   173   204        CMP     Component Value Date/Time   NA 142 02/19/2022 0532   K 3.7 02/19/2022 0532   CL 111 02/19/2022 0532   CO2 21 (L) 02/19/2022 0532   GLUCOSE 142 (H) 02/19/2022 0532   BUN 24 (H) 02/19/2022 0532   CREATININE 0.91 02/19/2022 0532   CREATININE 1.03 10/08/2020 0820   CALCIUM 8.4 (L) 02/19/2022 0532   PROT 5.3 (L) 02/17/2022 1230   ALBUMIN 3.1 (L) 02/17/2022 1230   AST 24 02/17/2022 1230   ALT 16 02/17/2022 1230   ALKPHOS 46 02/17/2022 1230   BILITOT 1.2 02/17/2022 1230   GFRNONAA >60 02/19/2022 0532   GFRNONAA 81 10/08/2020 0820   GFRAA 94 10/08/2020 0820     No results found.     Assessment & Plan:   1. Lymphedema Recommend:  No surgery or intervention at this point in time.  The patient's recent hip replacement as well as some changes in his day-to-day activities are likely causing some of the worsening of his control swelling.  I have reviewed my discussion with the patient regarding lymphedema and why it  causes symptoms.  Patient will continue wearing graduated compression on a daily basis. The patient should put the compression on first thing in the morning and removing them in the evening. The patient should not sleep in the compression.   In addition, behavioral modification throughout the day will be continued.  This will include frequent elevation (such as in a recliner), use of over the counter pain medications as needed and exercise such as walking.  The systemic causes for chronic edema such as liver, kidney and cardiac etiologies does not appear to have significant changed over the past year.    The patient will continue aggressive use of the  lymph pump.  This will continue to improve the edema control and prevent sequela such as ulcers and infections.   The patient will follow-up in 6 months  2. Essential hypertension Continue antihypertensive medications as already ordered, these medications have been reviewed and there  are no changes at this time.  Current Outpatient Medications on File Prior to Visit  Medication Sig Dispense Refill   allopurinol (ZYLOPRIM) 100 MG tablet TAKE 1 TABLET(100 MG) BY MOUTH DAILY 90 tablet 3   cholecalciferol (VITAMIN D) 1000 units tablet Take 1,000 Units by mouth daily.     citalopram (CELEXA) 40 MG tablet TAKE 1 TABLET(40 MG) BY MOUTH DAILY 90 tablet 2   docusate sodium (COLACE) 100 MG capsule Take 100 mg by mouth daily.     fexofenadine (ALLEGRA) 180 MG tablet Take 180 mg by mouth.     ibuprofen (ADVIL) 200 MG tablet Take 200 mg by mouth daily after supper.     levothyroxine (SYNTHROID) 25 MCG tablet TAKE 1 TABLET BY MOUTH DAILY BEFORE BREAKFAST ON AN EMPTY STOMACH( NOT TO TAKE WITH OTHER MEDICINES) 90 tablet 3   ondansetron (ZOFRAN) 4 MG tablet Take by mouth.     propranolol (INDERAL) 10 MG tablet Take 5 mg by mouth 2 (two) times daily. For severe anxiety/agitation 180 tablet 2   QUEtiapine (SEROQUEL) 25 MG tablet Take 3 tablets (75 mg total) by mouth at bedtime. (Patient taking differently: Take 25 mg by mouth 3 (three) times daily.) 90 tablet 1   thiamine (VITAMIN B-1) 100 MG tablet Take 100 mg by mouth daily.     traZODone (DESYREL) 50 MG tablet Take 1 tablet (50 mg total) by mouth at bedtime. Dose change 90 tablet 1   valbenazine (INGREZZA) 80 MG capsule Take 1 capsule (80 mg total) by mouth at bedtime. 30 capsule 7   enoxaparin (LOVENOX) 40 MG/0.4ML injection Inject 0.4 mLs (40 mg total) into the skin daily for 14 days. 0 mL 0   oxyCODONE-acetaminophen (PERCOCET/ROXICET) 5-325 MG tablet Take 1 tablet by mouth every 6 (six) hours as needed for moderate pain. (Patient not taking: Reported on 04/29/2022) 12 tablet 0   No current facility-administered medications on file prior to visit.    There are no Patient Instructions on file for this visit. No follow-ups on file.   Georgiana Spinner, NP

## 2022-08-19 ENCOUNTER — Other Ambulatory Visit (HOSPITAL_COMMUNITY): Payer: Self-pay | Admitting: General Surgery

## 2022-08-19 ENCOUNTER — Other Ambulatory Visit: Payer: Self-pay | Admitting: General Surgery

## 2022-08-19 DIAGNOSIS — N433 Hydrocele, unspecified: Secondary | ICD-10-CM

## 2022-09-01 ENCOUNTER — Ambulatory Visit
Admission: RE | Admit: 2022-09-01 | Discharge: 2022-09-01 | Disposition: A | Discharge status: Home / Self Care | Payer: Medicare Other | Source: Ambulatory Visit | Attending: General Surgery | Admitting: General Surgery

## 2022-09-01 DIAGNOSIS — N433 Hydrocele, unspecified: Secondary | ICD-10-CM | POA: Insufficient documentation

## 2022-09-12 IMAGING — CT CT HIP*L* W/O CM
2 of 4 series · 15 of 46 positions shown, 17 images · non-contrast
Comparison: X-ray 02/17/2022

CLINICAL DATA: Left hip fracture

EXAM:
CT OF THE LEFT HIP WITHOUT CONTRAST
TECHNIQUE: Multidetector CT imaging of the left hip was performed according to
the standard protocol. Multiplanar CT image reconstructions were
also generated.
RADIATION DOSE REDUCTION: This exam was performed according to the
departmental dose-optimization program which includes automated
exposure control, adjustment of the mA and/or kV according to
patient size and/or use of iterative reconstruction technique.

[Series 4: soft tissue · axial · 0.49mm/px · z∈[-1314,-1104]mm · 12 of 121 slices shown, 14 images]
[im 8/121  soft-tissue]
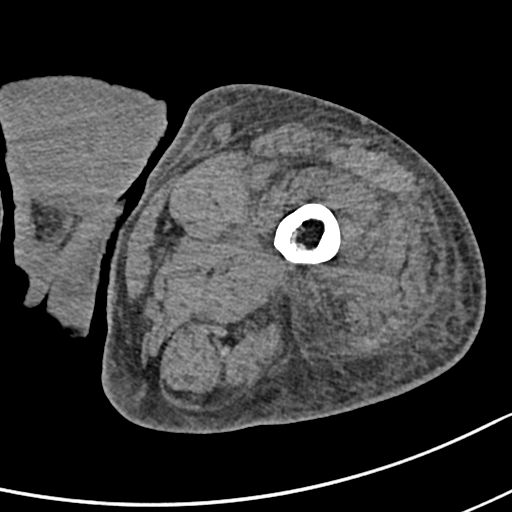
[im 8/121  bone]
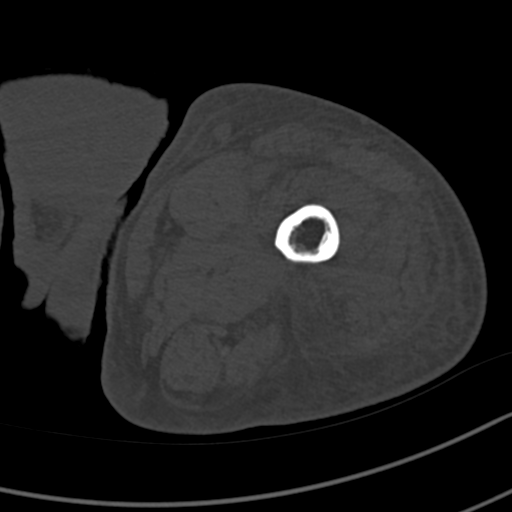
[im 16/121  soft-tissue]
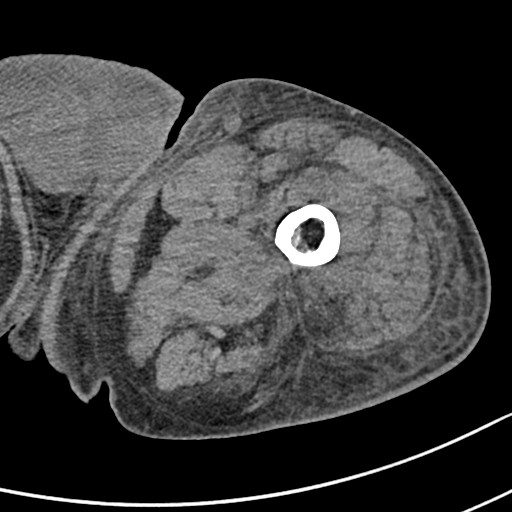
[im 28/121  soft-tissue]
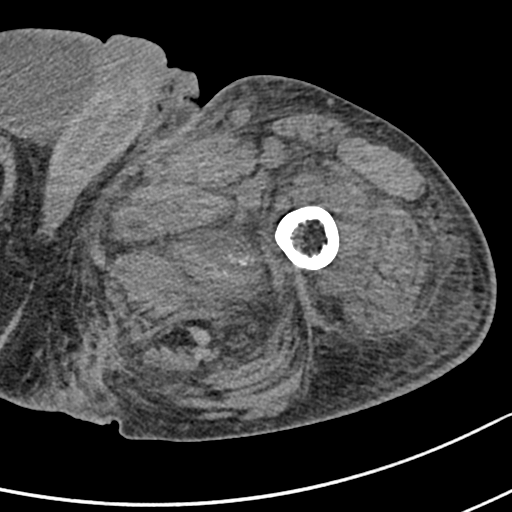
[im 35/121  soft-tissue]
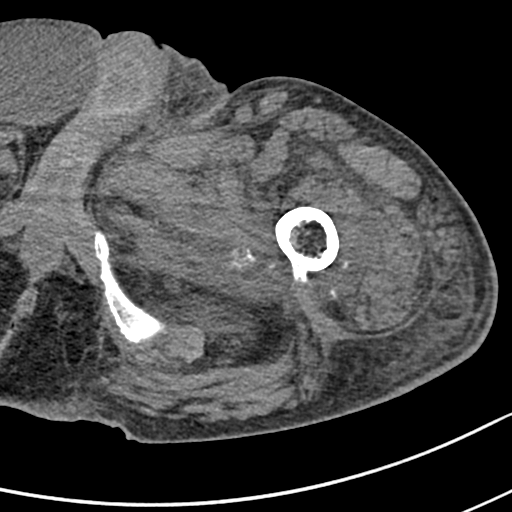
[im 47/121  soft-tissue]
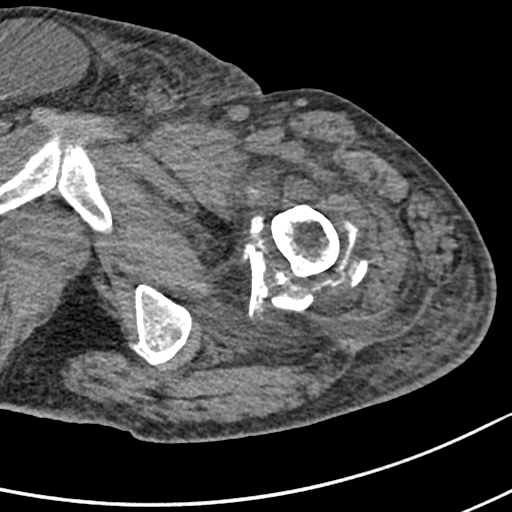
[im 55/121  soft-tissue]
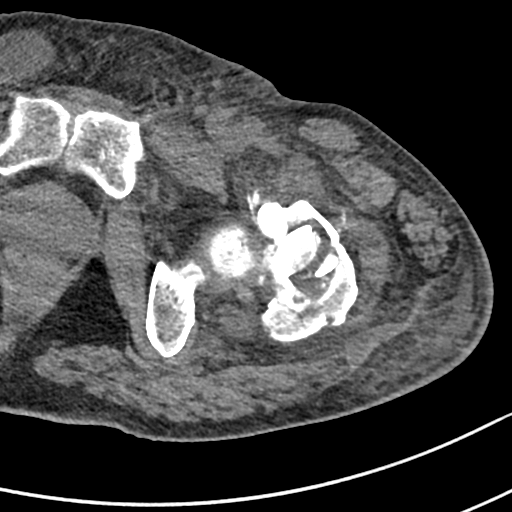
[im 66/121  soft-tissue]
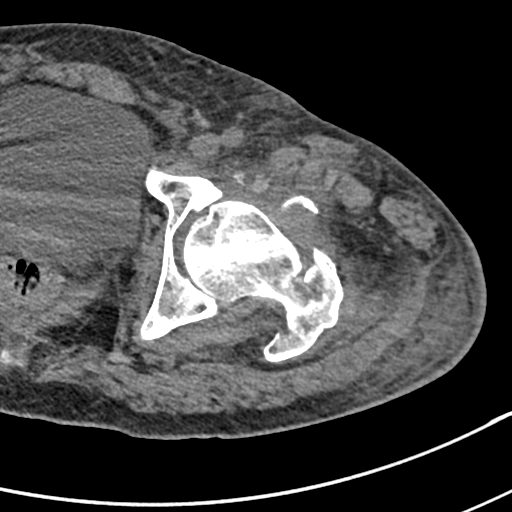
[im 74/121  soft-tissue]
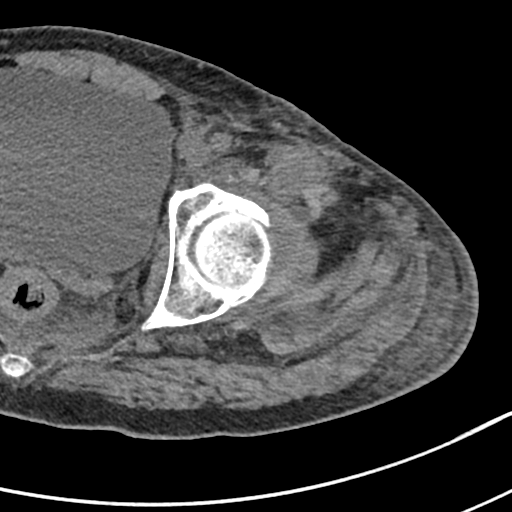
[im 86/121  soft-tissue]
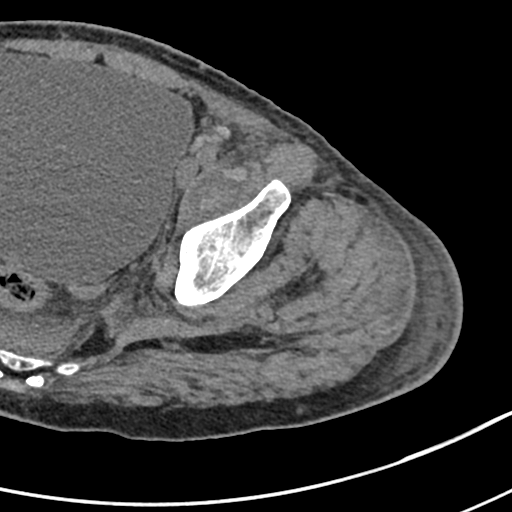
[im 86/121  bone]
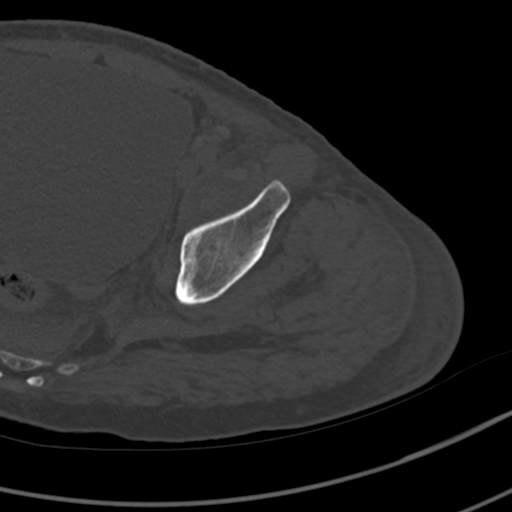
[im 93/121  soft-tissue]
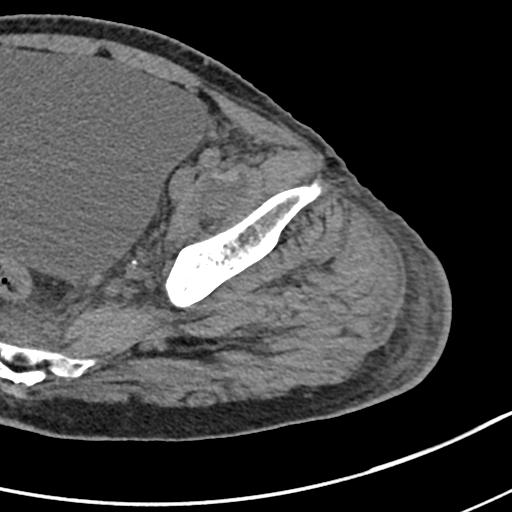
[im 105/121  soft-tissue]
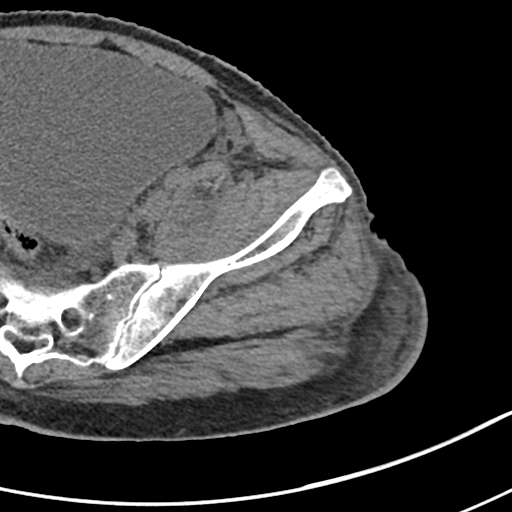
[im 113/121  soft-tissue]
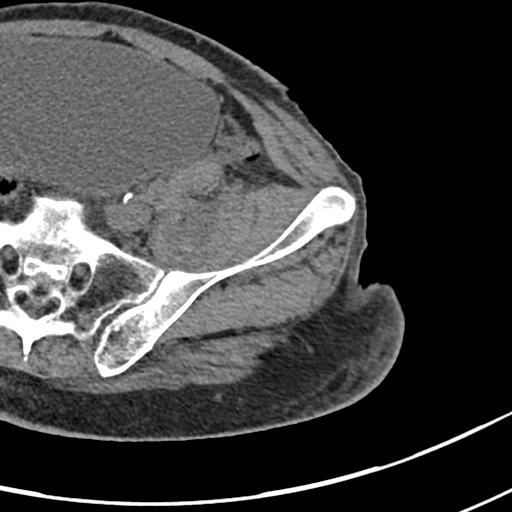

[Series 5: cor soft · coronal · 0.35mm/px · 3 of 93 slices shown]
[im 24/93  soft-tissue]
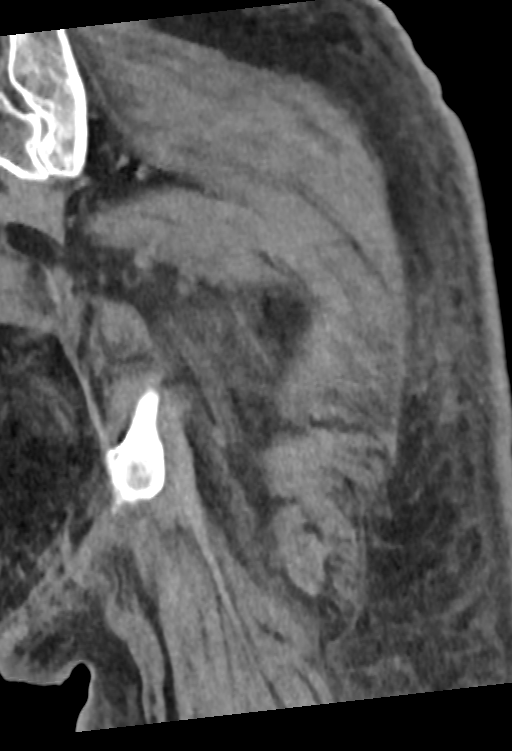
[im 47/93  soft-tissue]
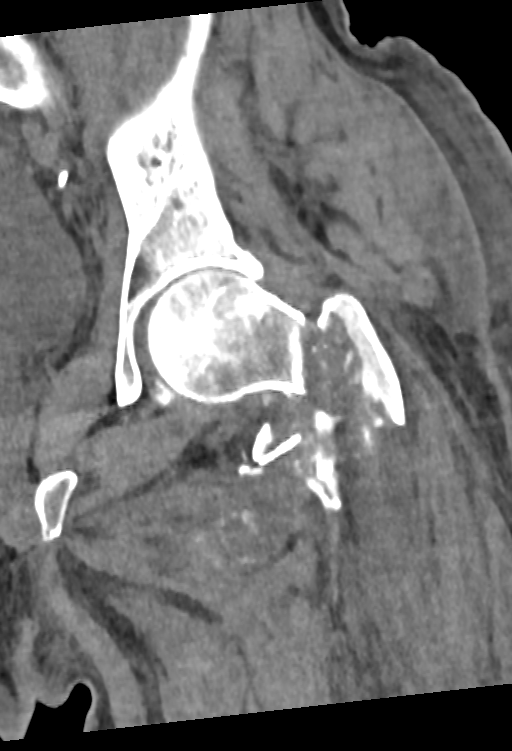
[im 70/93  soft-tissue]
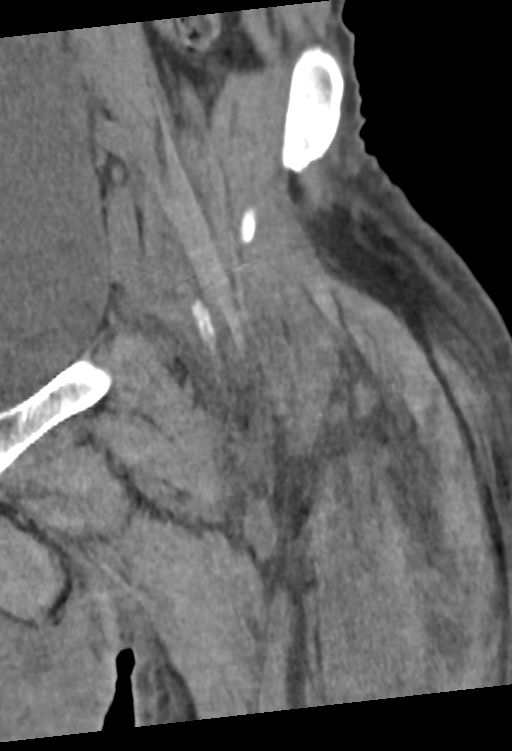

[15 of 46 positions shown; findings below may reference images not displayed]

FINDINGS: Bones/Joint/Cartilage

Subacute comminuted intratrochanteric fracture of the proximal left
femur with varus angulation and anterior displacement. There is
developing callus formation across the fracture site although no
bridging bone formation at this time. Femoral head remains aligned
within the acetabulum without dislocation. Visualized portion of the
left hemipelvis is intact. Left SI joint and pubic symphysis are
intact without diastasis.

Ligaments

Suboptimally assessed by CT.

Muscles and Tendons

Fluid tracking along the course of the left iliopsoas tendon which
could be related to posttraumatic hematoma or bursitis.

Soft tissues

Diffuse soft tissue edema. Presacral fluid/edema. Moderately
distended urinary bladder. Moderate-large amount of fluid within the
right inguinal canal. Partially visualized bilateral hydroceles.
IMPRESSION: 1. Subacute comminuted intratrochanteric fracture of the proximal
left femur, as described.
2. Fluid tracking along the course of the left iliopsoas tendon
which could be related to posttraumatic hematoma or bursitis.
3. Moderate-large amount of fluid within the right inguinal canal.
4. Moderate distension of the urinary bladder. Correlate for
retention or outlet obstruction.
5. Presacral fluid/edema, nonspecific.

## 2022-09-12 IMAGING — CR DG CHEST 1V
1 series · 2 of 2 positions shown · non-contrast
Comparison: 06/17/2019

CLINICAL DATA: LEFT hip and knee pain post fall

EXAM:
CHEST  1 VIEW

[Series 1: dg chest 1 view · 0.14mm/px · 2 of 2 slices shown]
[im 1/2]
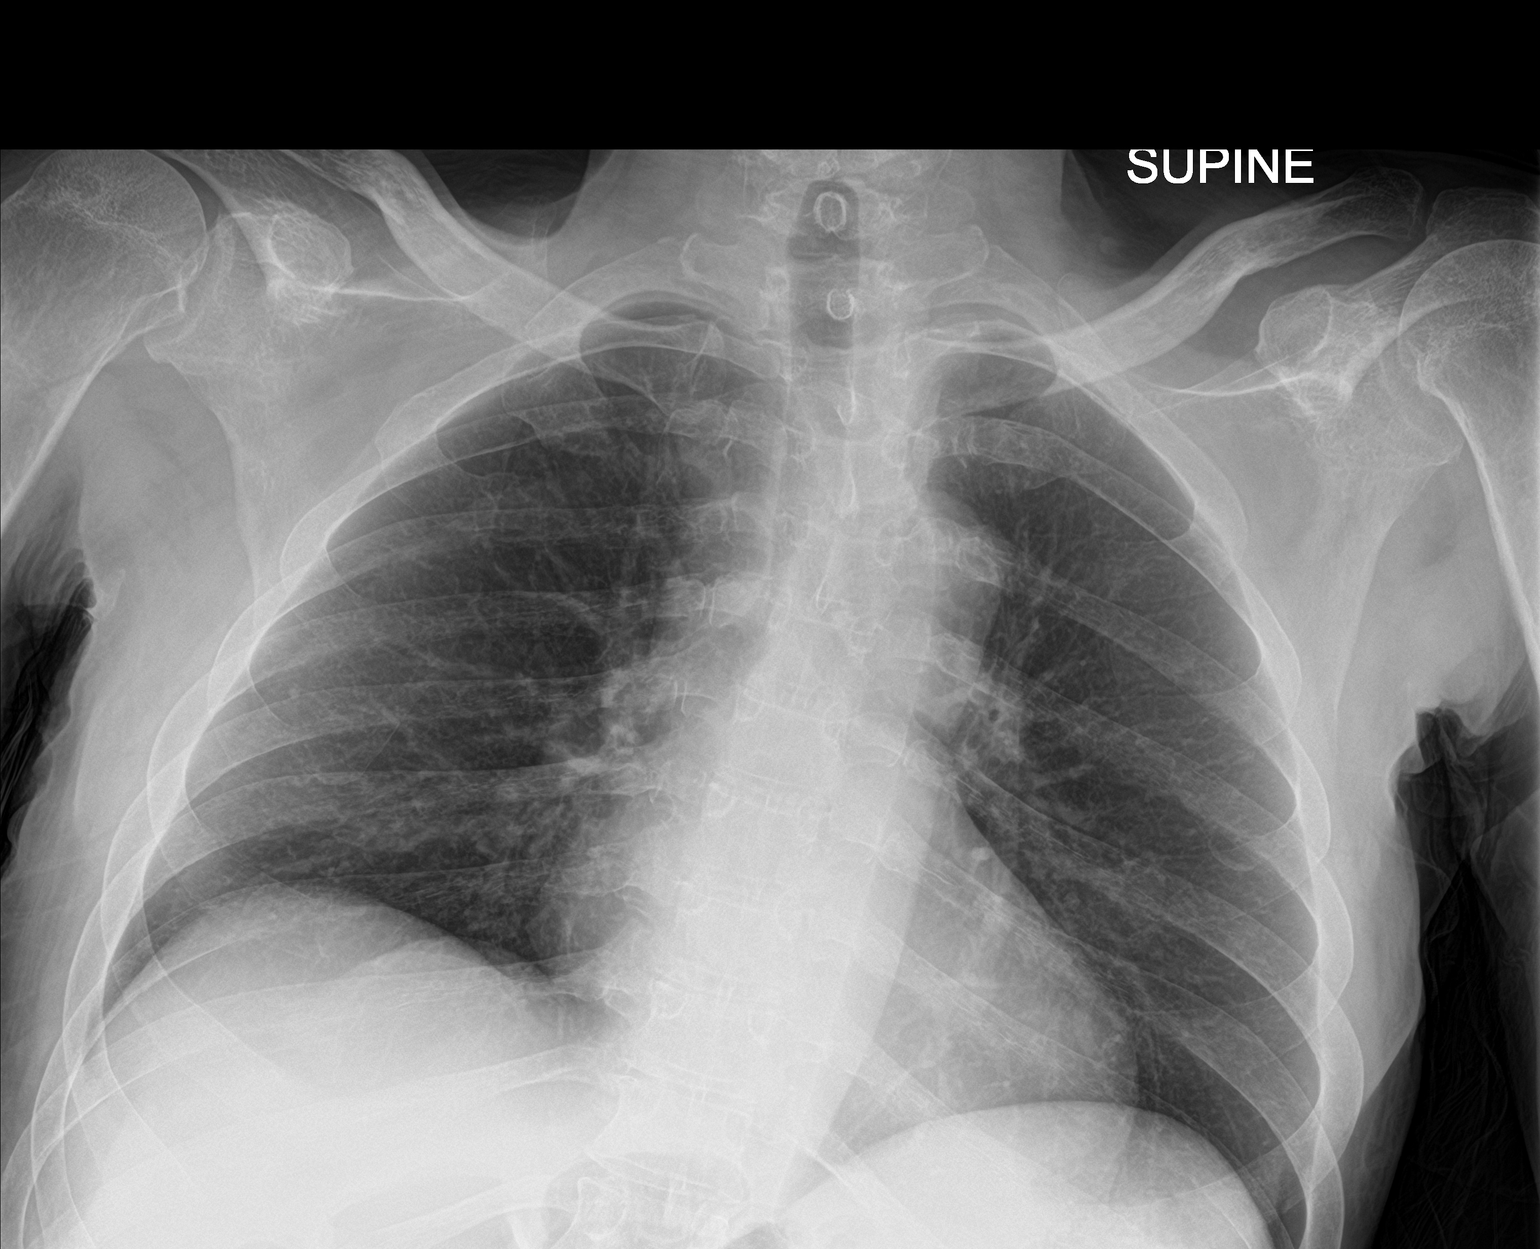
[im 2/2]
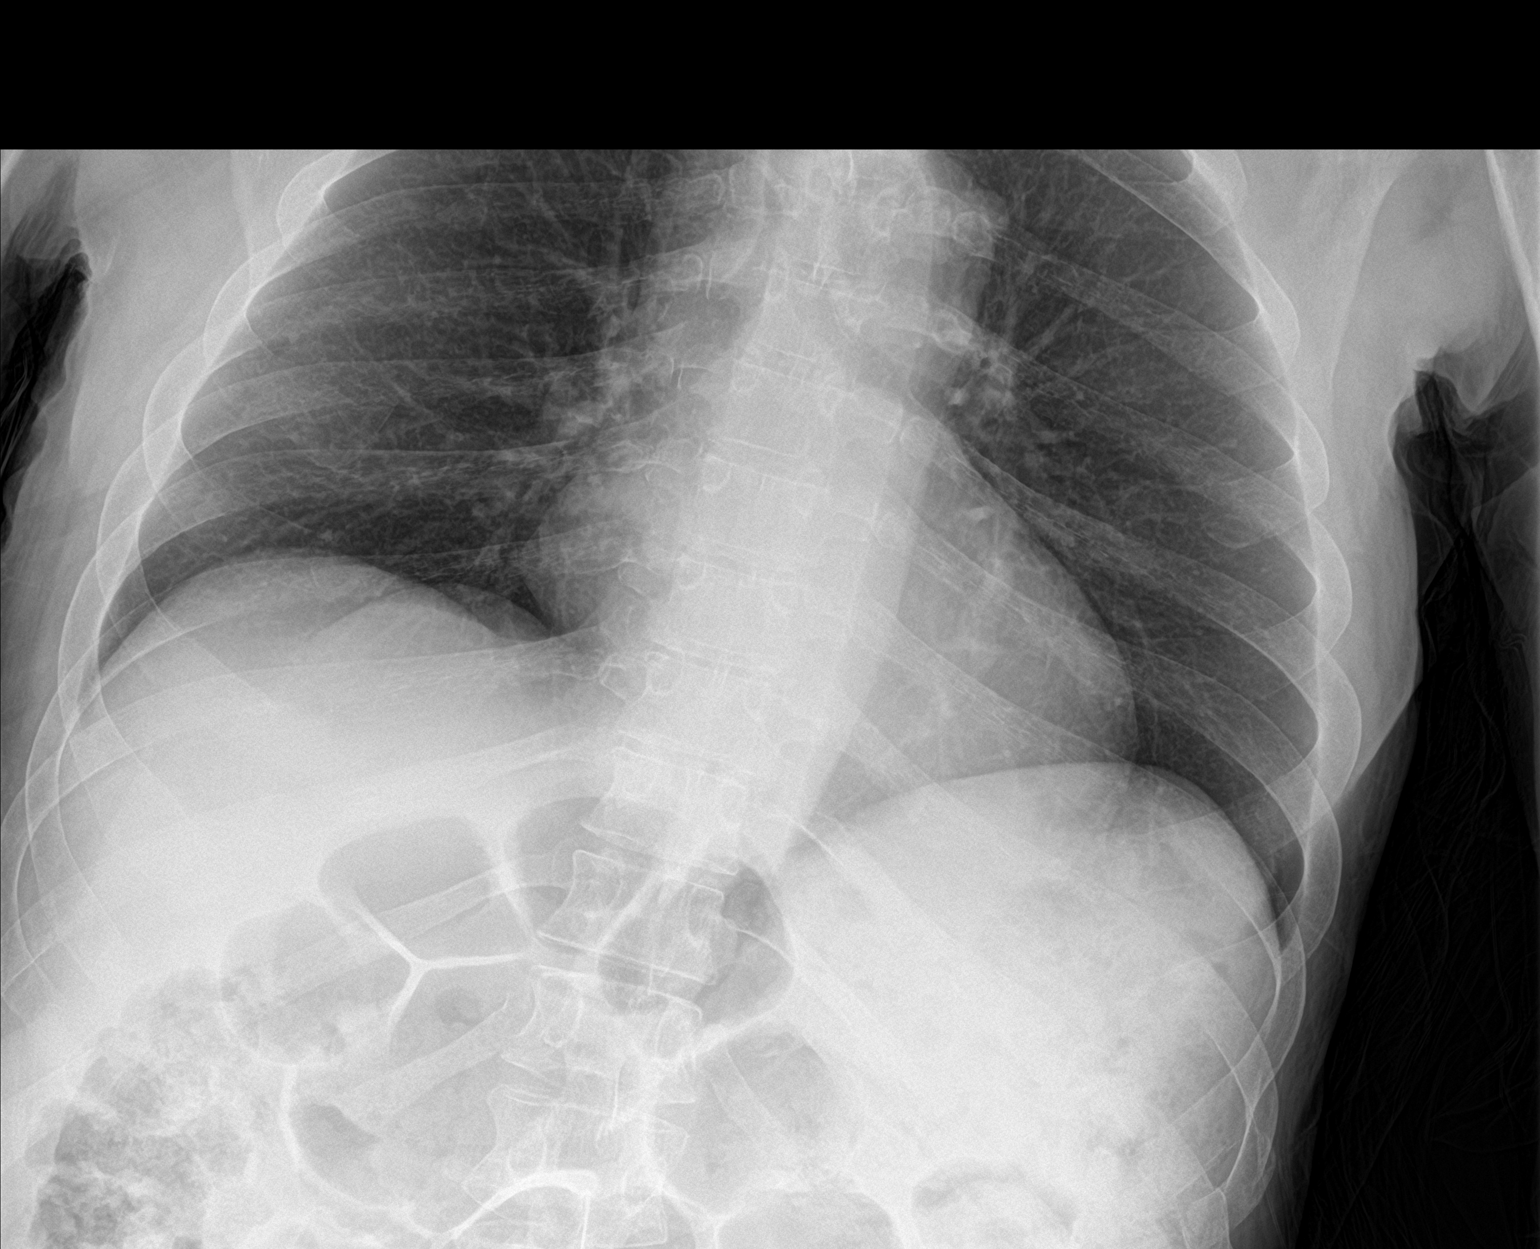

[2 of 2 positions shown; findings below may reference images not displayed]

FINDINGS: Normal heart size, mediastinal contours, and pulmonary vascularity.

Lungs clear.

No infiltrate, pleural effusion, or pneumothorax.

Bones demineralized.
IMPRESSION: No acute abnormalities.

## 2022-09-12 IMAGING — CR DG KNEE COMPLETE 4+V*L*
1 series · 4 of 4 positions shown · non-contrast
Comparison: None

CLINICAL DATA: Fall, did not have a walker, esp on 4 times since
the end of last week, LEFT knee LEFT hip pain

EXAM:
LEFT KNEE - COMPLETE 4+ VIEW

[Series 1: dg knee complete 4 views left · 0.14mm/px · 4 of 4 slices shown]
[im 1/4]
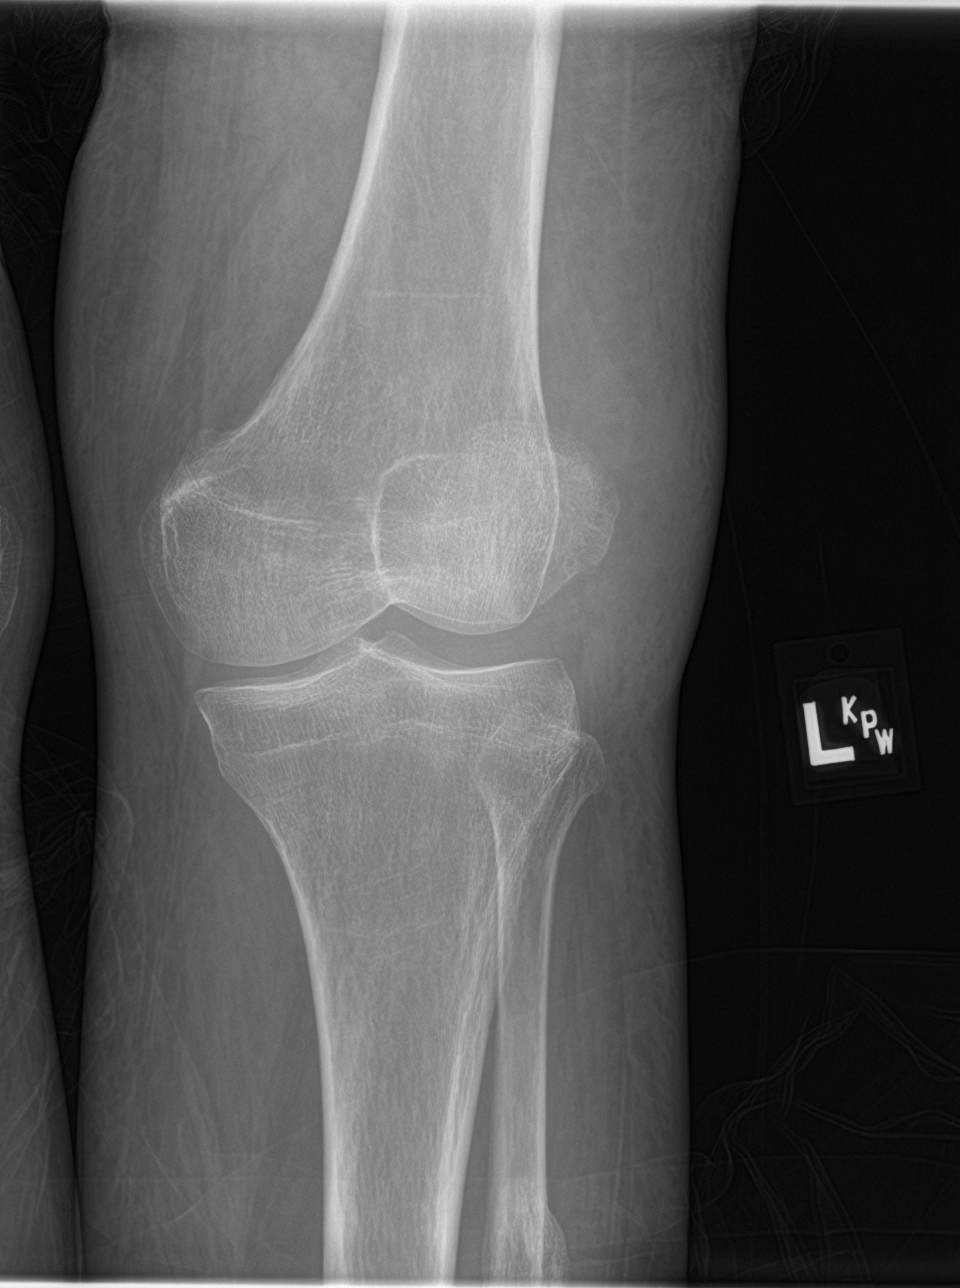
[im 2/4]
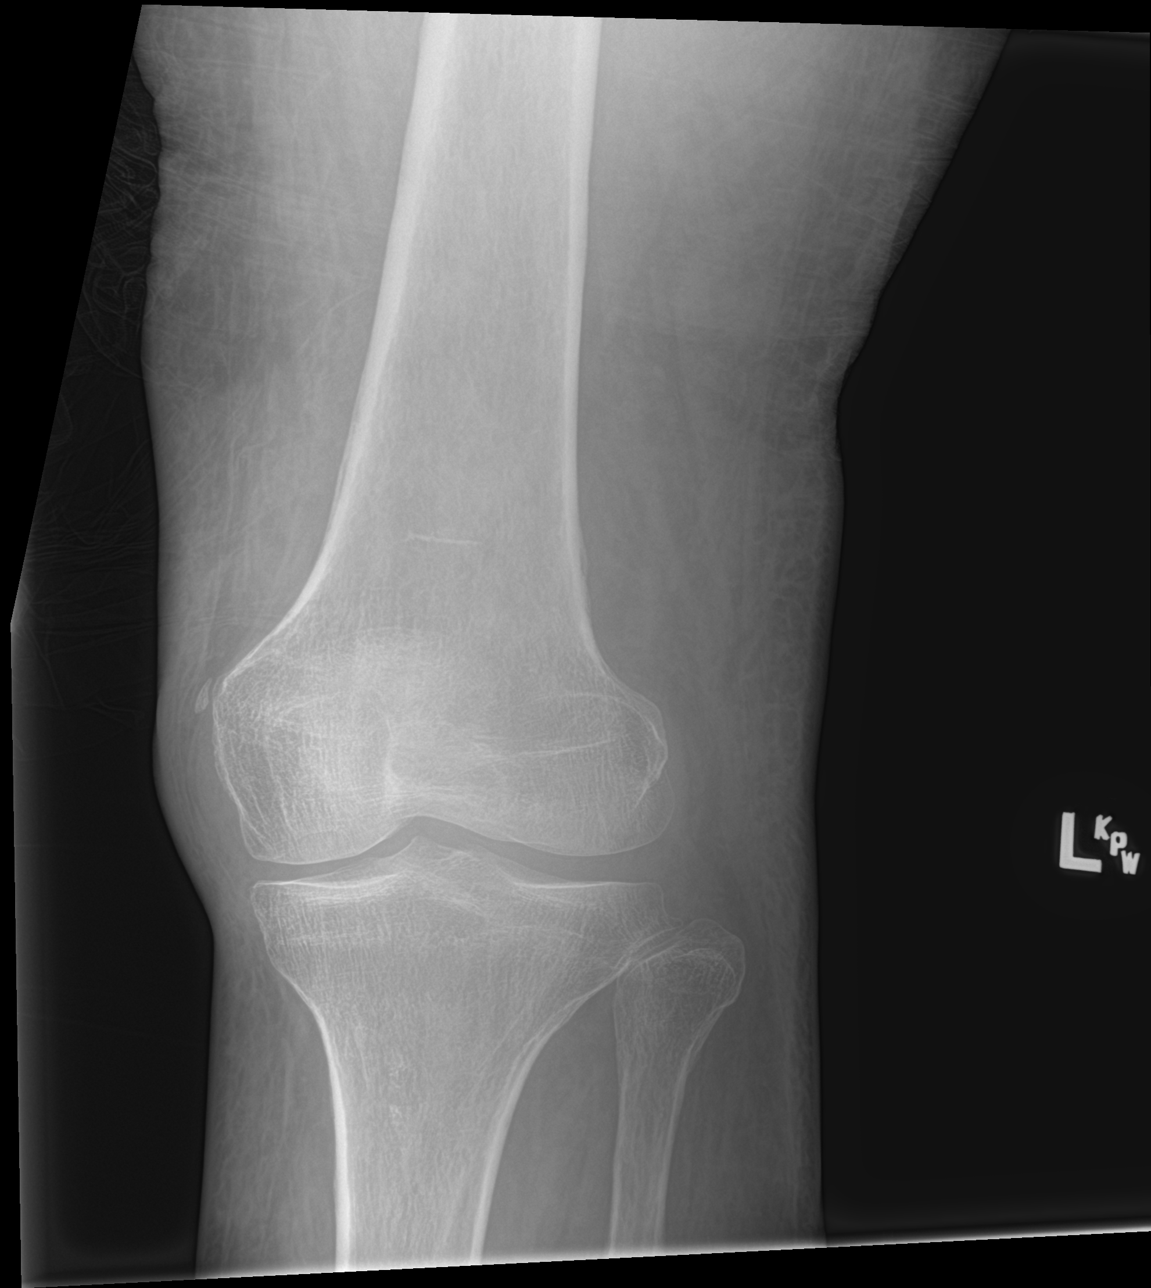
[im 3/4]
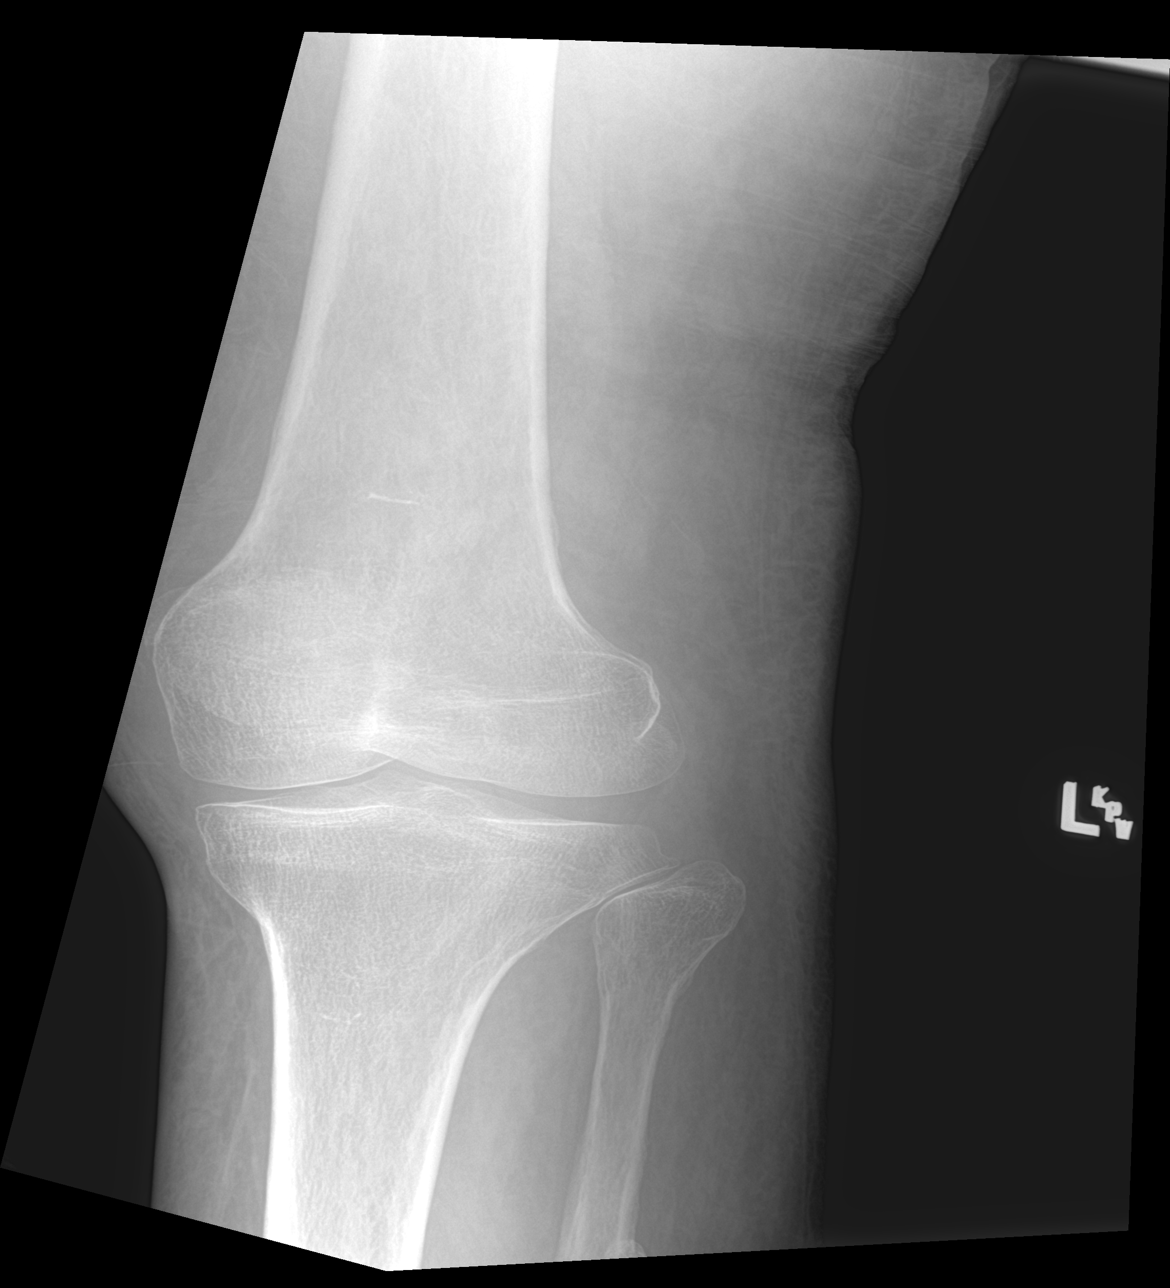
[im 4/4]
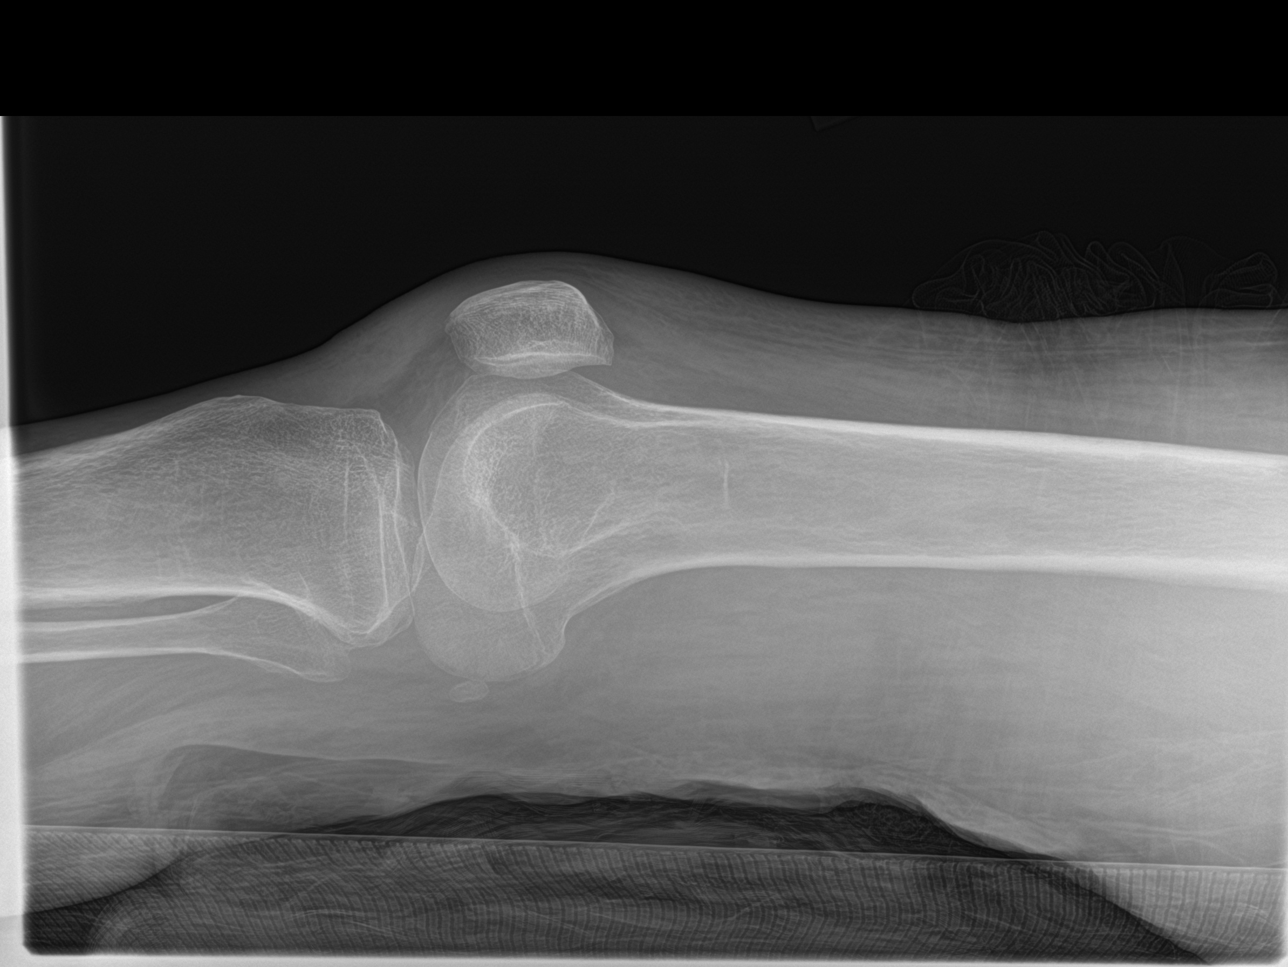

[4 of 4 positions shown; findings below may reference images not displayed]

FINDINGS: Osseous demineralization.

Soft tissue swelling LEFT knee region.

Minimal joint space narrowing medial compartment and patellofemoral
joint.

No acute fracture, dislocation, or bone destruction.

No joint effusion.

Mature periosteal new bone versus callus at margin of exam at LEFT
fibular diaphysis, question old fracture; incompletely assessed.
IMPRESSION: Osseous demineralization and minimal degenerative changes LEFT knee.

No acute osseous abnormalities.

Question healing fracture of mid LEFT fibular diaphysis; recommend
correlation with patient history and exclusion of pain this site to
exclude acute injury, and consider dedicated tibial/fibular
radiographs if symptomatic at this site.

## 2022-09-12 IMAGING — CT CT HEAD W/O CM
4 series · 17 of 47 positions shown, 19 images · non-contrast
Comparison: Brain CT 01/20/2022.

CLINICAL DATA: Patient with fall.



[Series 2: head wo · axial · 0.41mm/px · z∈[-200,-80]mm · 7 of 34 slices shown, 9 images]
[im 5/34  brain]
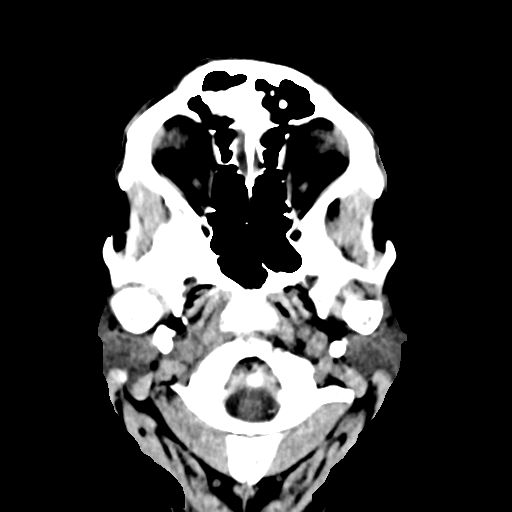
[im 5/34  bone]
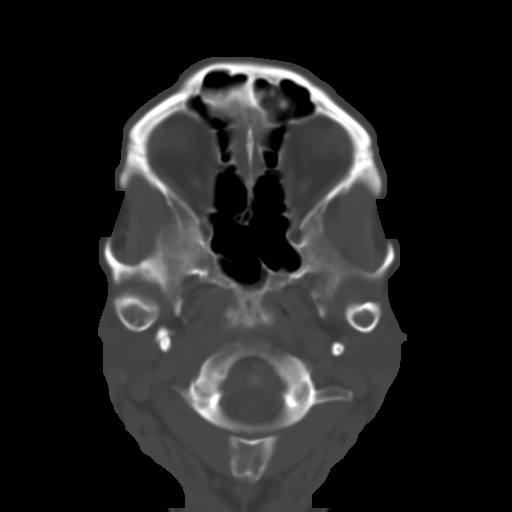
[im 9/34  brain]
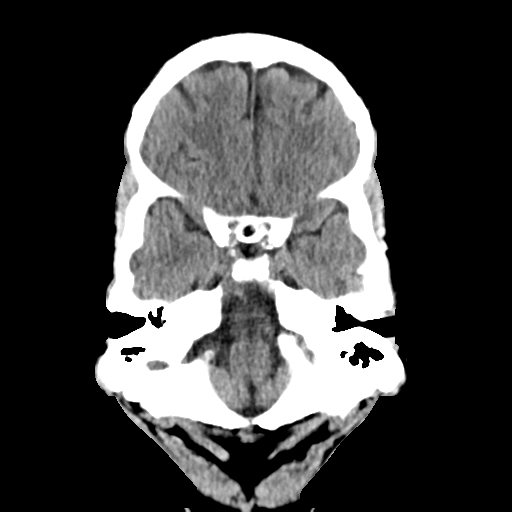
[im 13/34  brain]
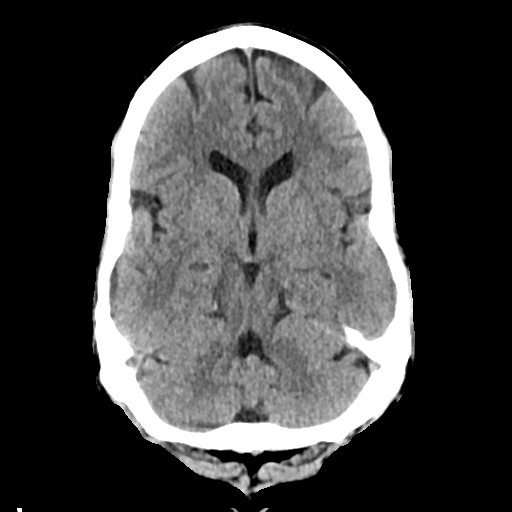
[im 17/34  brain]
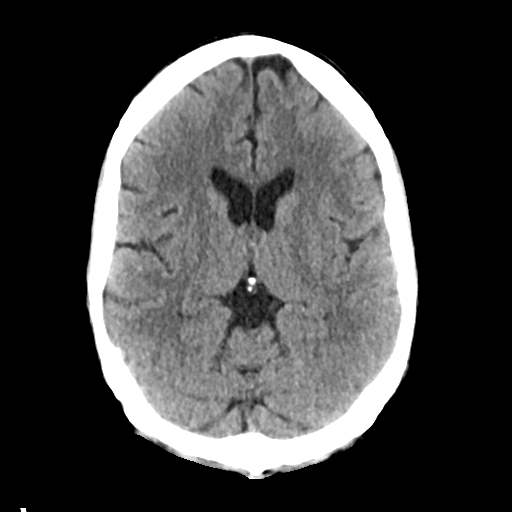
[im 21/34  brain]
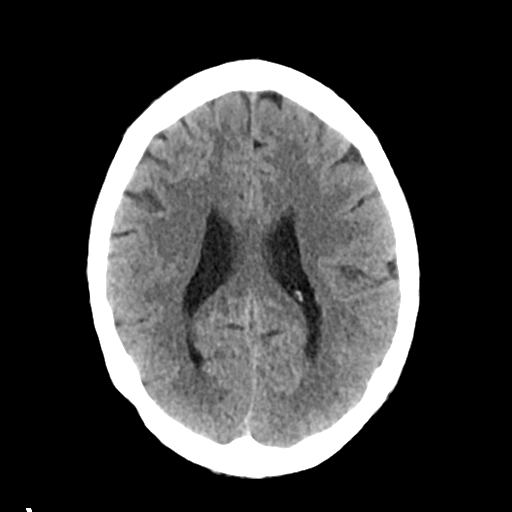
[im 21/34  bone]
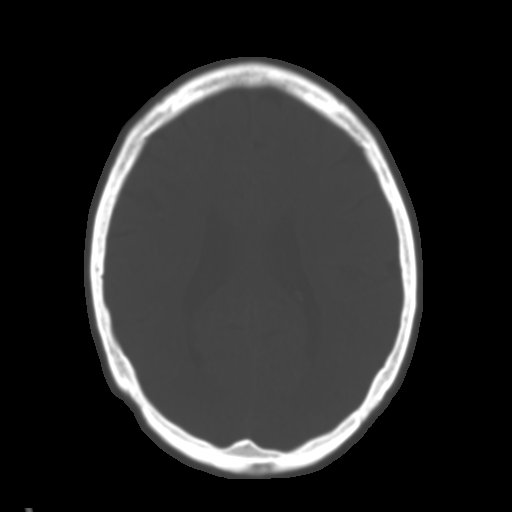
[im 25/34  brain]
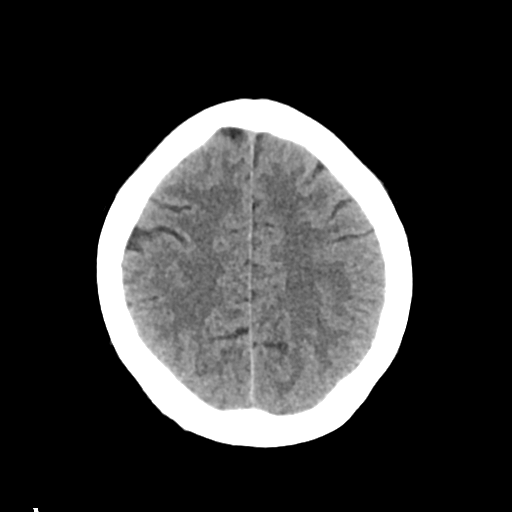
[im 29/34  brain]
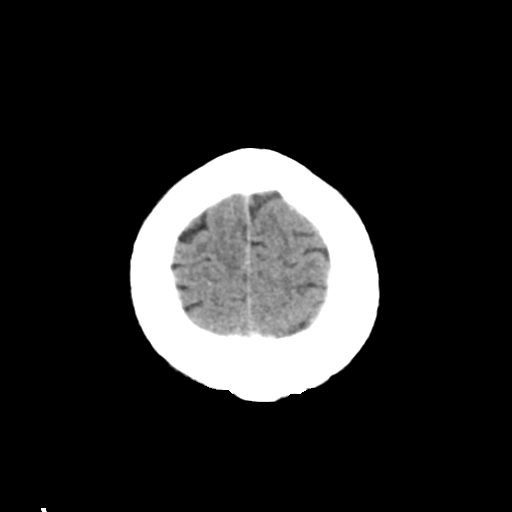

[Series 3: head bone · axial · 0.41mm/px · z∈[-204,-146]mm · 4 of 84 slices shown]
[im 9/84  bone]
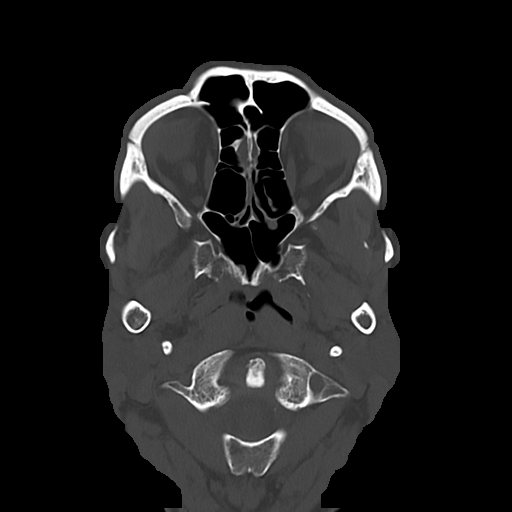
[im 17/84  bone]
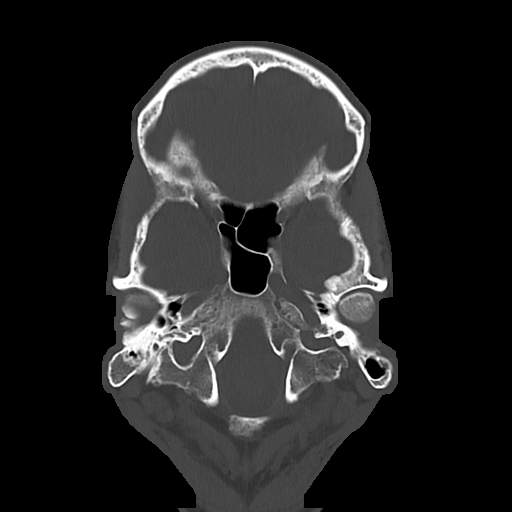
[im 25/84  bone]
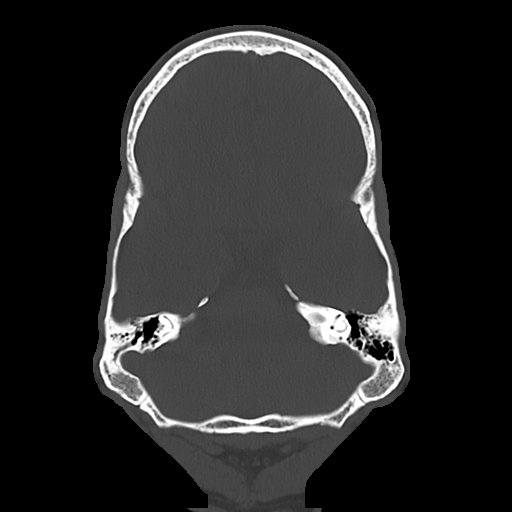
[im 38/84  bone]
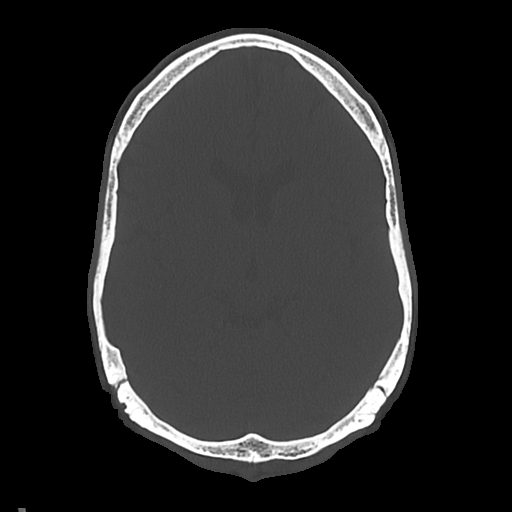

[Series 4: cor soft · coronal · 0.34mm/px · 3 of 66 slices shown]
[im 22/66  brain]
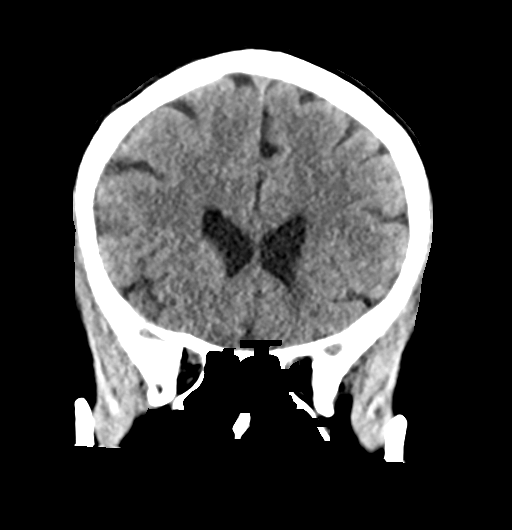
[im 29/66  brain]
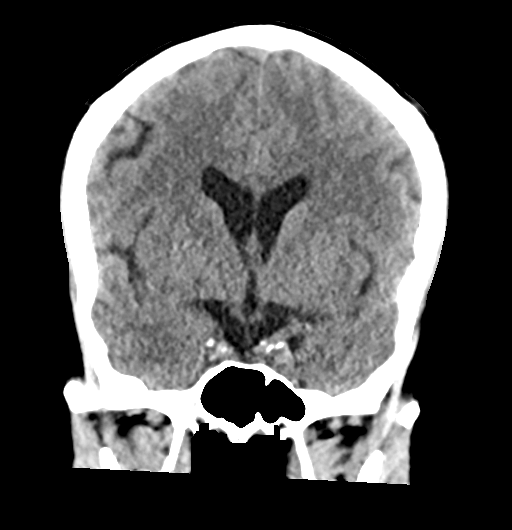
[im 37/66  brain]
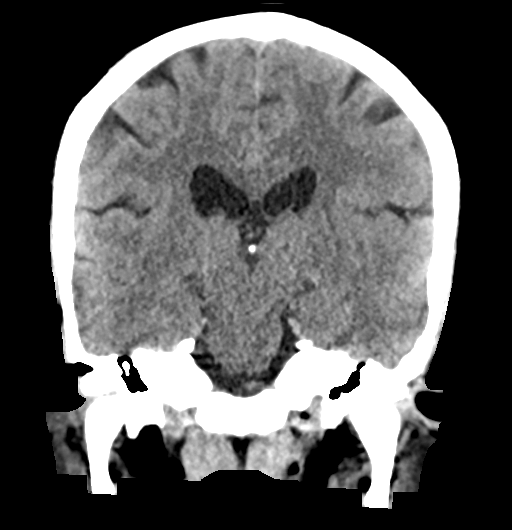

[Series 5: sag soft · sagittal · 0.35mm/px · 3 of 50 slices shown]
[im 17/50  brain]
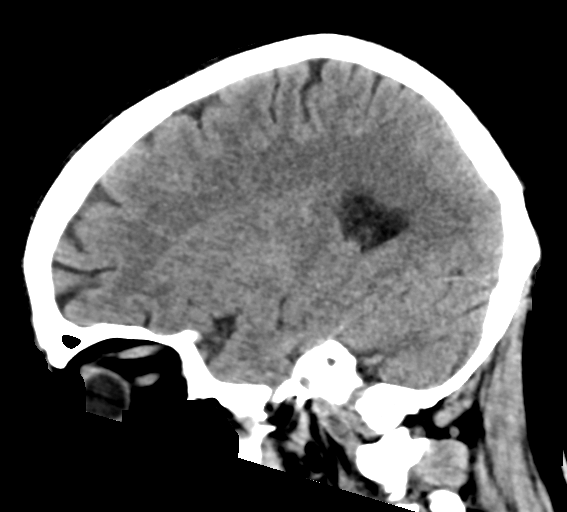
[im 25/50  brain]
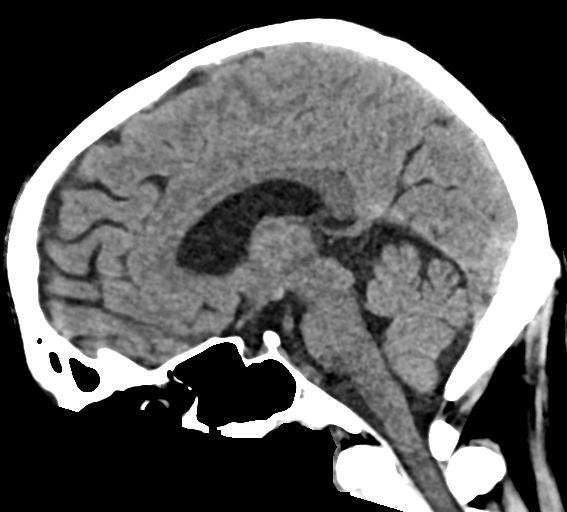
[im 33/50  brain]
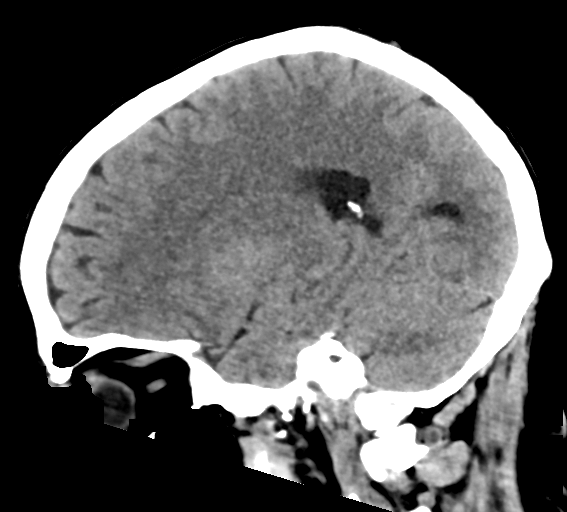

[17 of 47 positions shown; findings below may reference images not displayed]

FINDINGS: Brain: Ventricles and sulci are appropriate for patient's age. No
evidence for acute cortical based infarct, intracranial hemorrhage,
mass lesion or mass effect.

Vascular: Unremarkable

Skull: Intact.

Sinuses/Orbits: Paranasal sinuses are well aerated. Mastoid air
cells are unremarkable.

Other: None.
IMPRESSION: No acute intracranial process.

## 2022-09-13 IMAGING — US US EXTREM LOW VENOUS
1 series · 13 of 24 positions shown · non-contrast
Comparison: None.

CLINICAL DATA: Bilateral lower extremity edema. Recent acute
intratrochanteric fracture of the left hip.



[Series 1: us extrem low venous · 0.07mm/px · 13 of 57 slices shown]
[im 1/57]
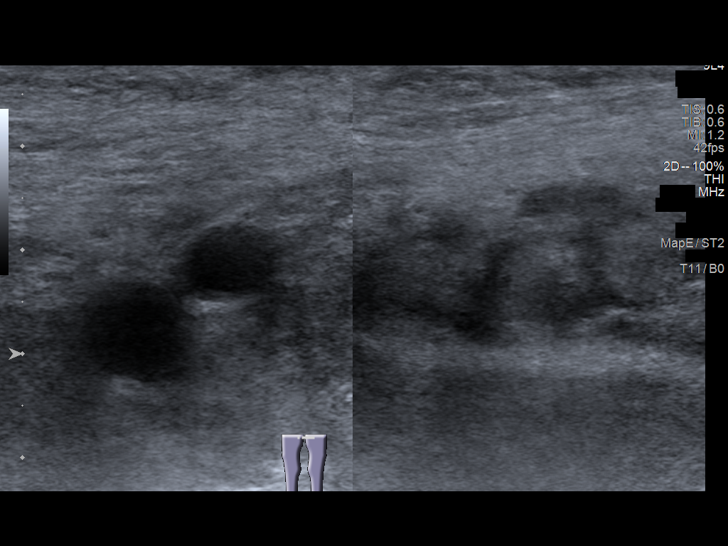
[im 5/57]
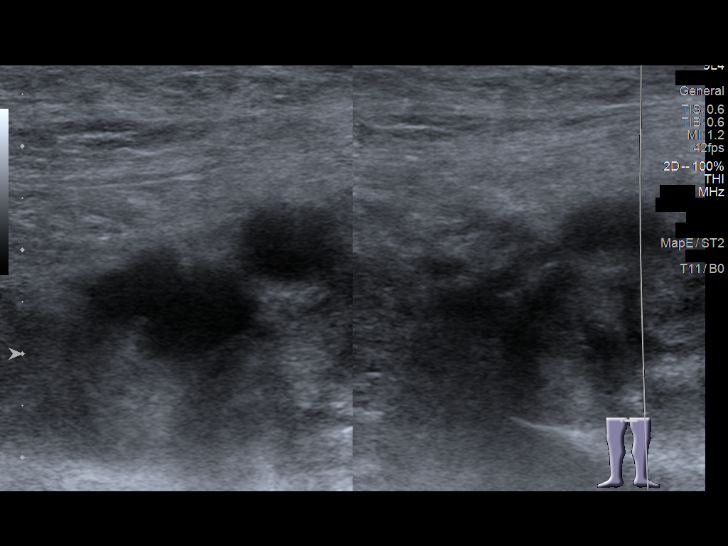
[im 10/57]
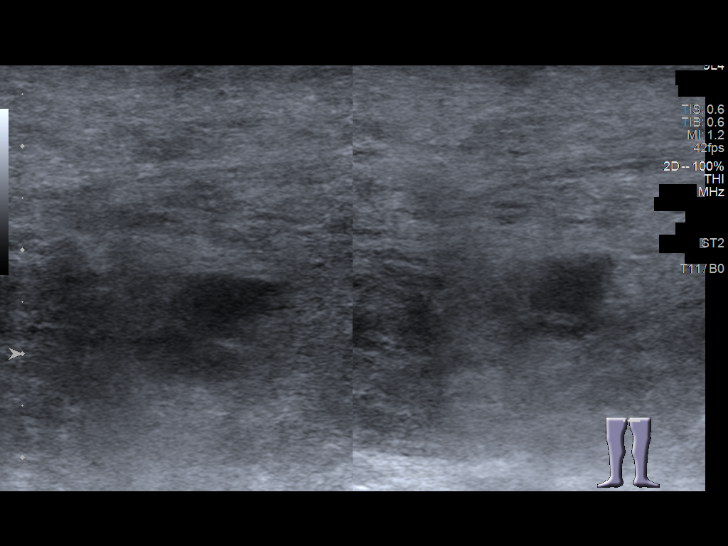
[im 15/57]
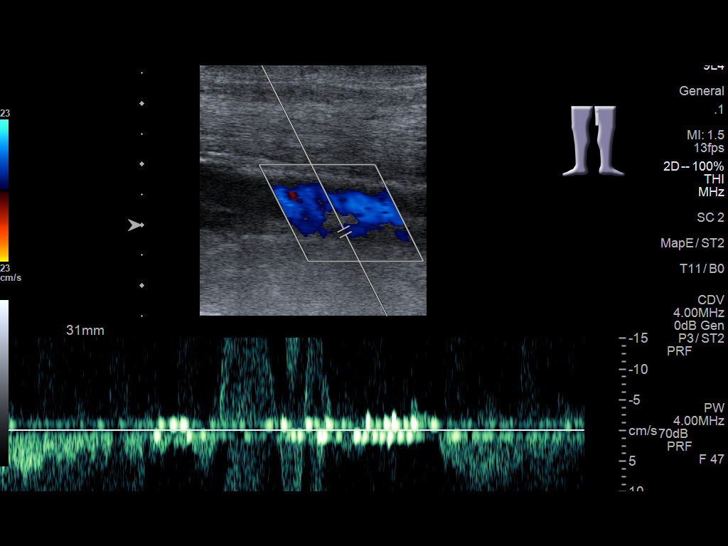
[im 20/57]
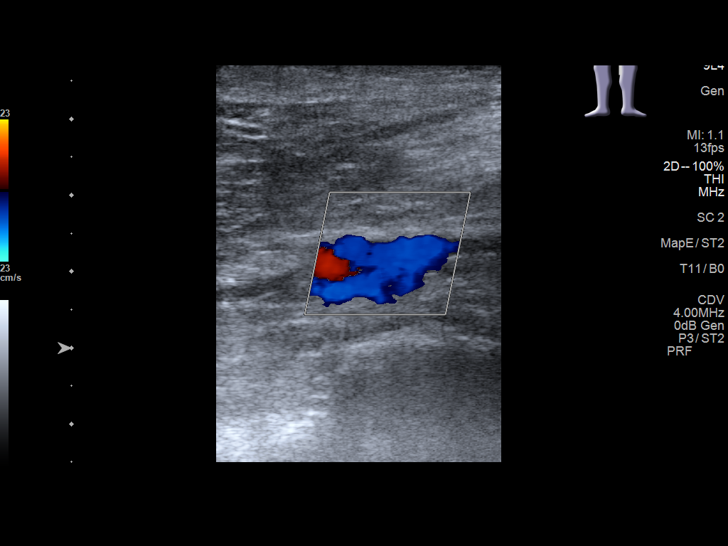
[im 25/57]
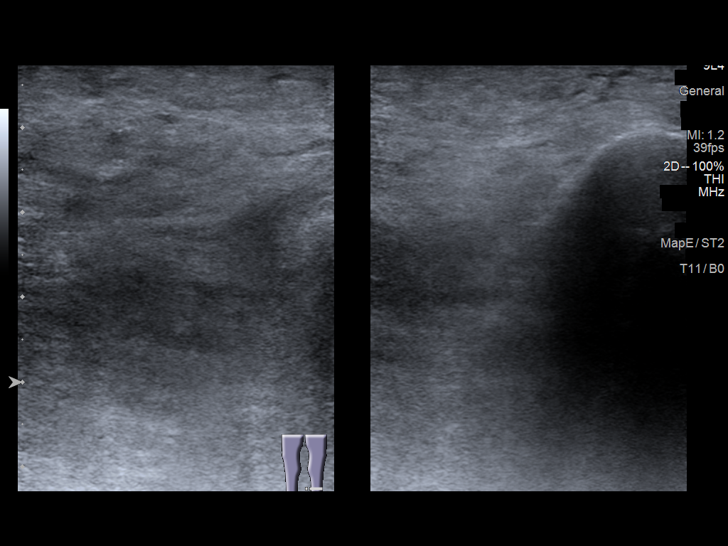
[im 30/57]
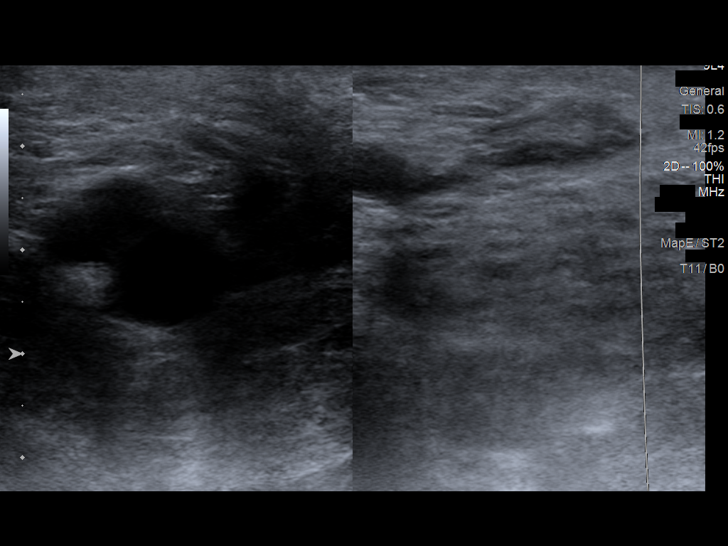
[im 32/57]
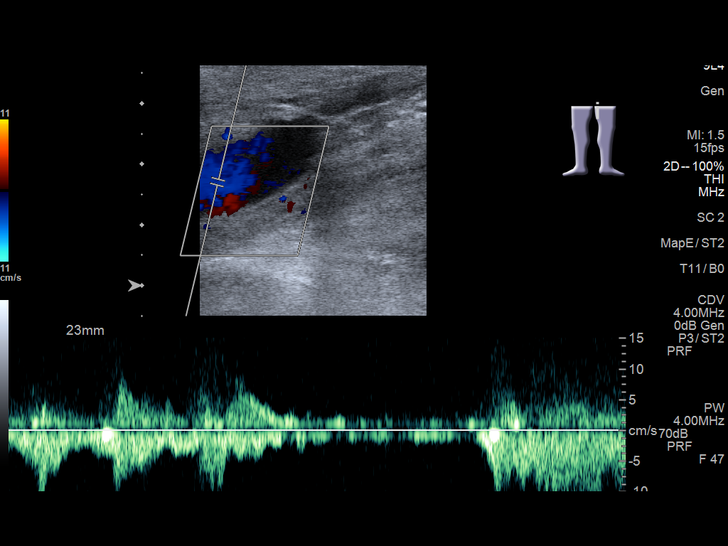
[im 37/57]
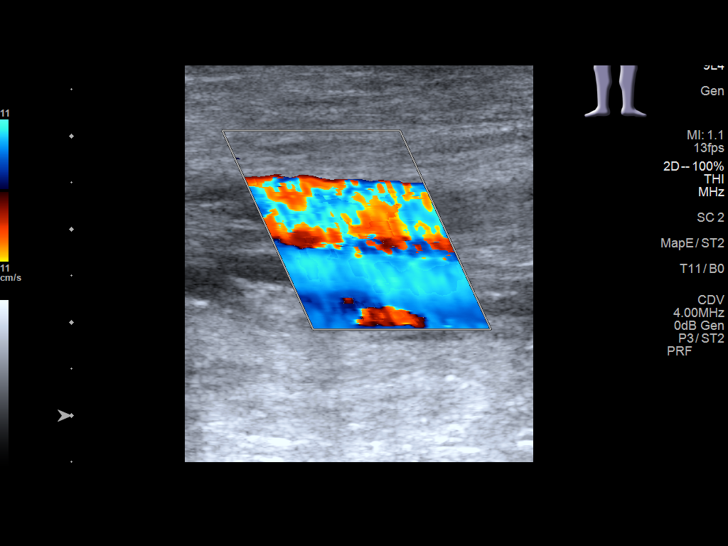
[im 42/57]
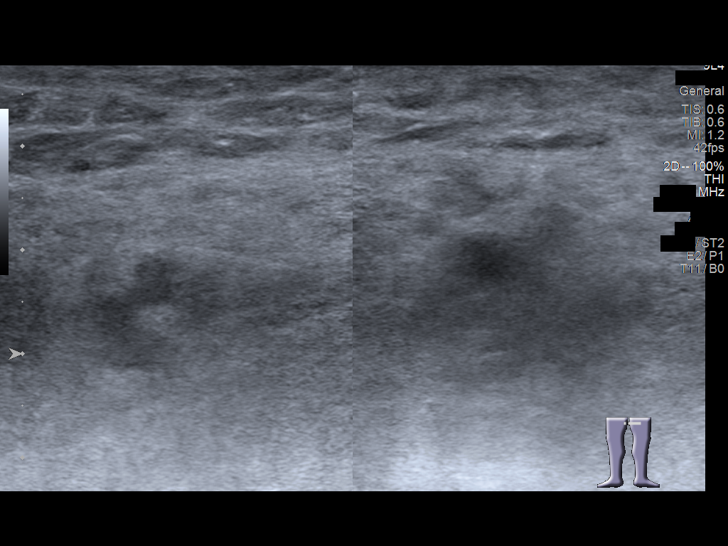
[im 47/57]
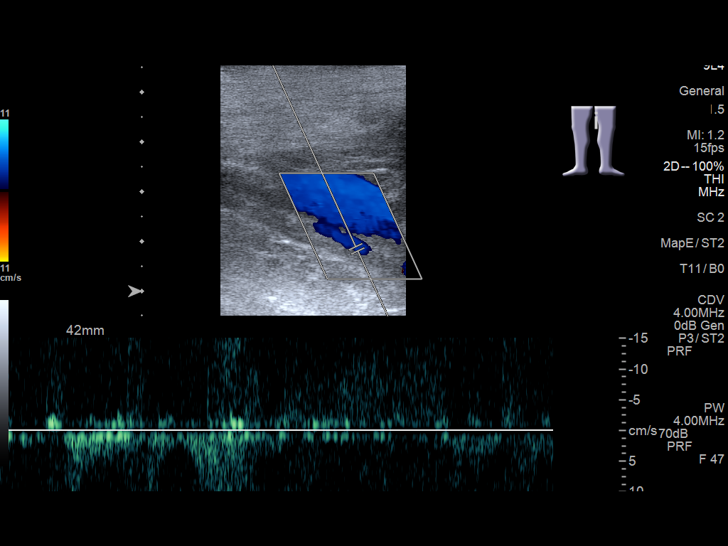
[im 52/57]
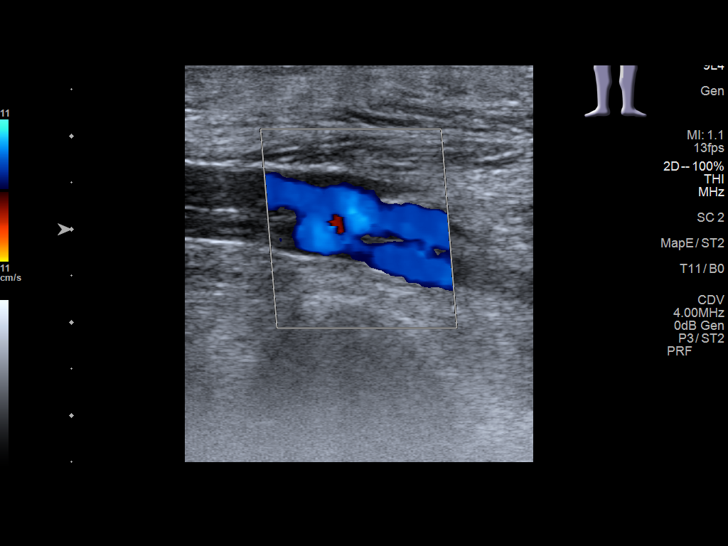
[im 57/57]
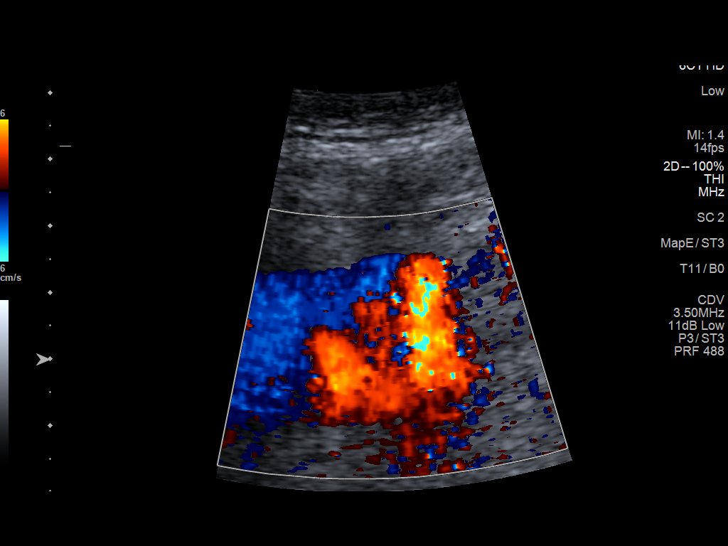

[13 of 24 positions shown; findings below may reference images not displayed]

FINDINGS: RIGHT LOWER EXTREMITY

Common Femoral Vein: No evidence of thrombus. Normal
compressibility, respiratory phasicity and response to augmentation.

Saphenofemoral Junction: No evidence of thrombus. Normal
compressibility and flow on color Doppler imaging.

Profunda Femoral Vein: No evidence of thrombus. Normal
compressibility and flow on color Doppler imaging.

Femoral Vein: No evidence of thrombus. Normal compressibility,
respiratory phasicity and response to augmentation.

Popliteal Vein: No evidence of thrombus. Normal compressibility,
respiratory phasicity and response to augmentation.

Calf Veins: No evidence of thrombus. Normal compressibility and flow
on color Doppler imaging.

Superficial Great Saphenous Vein: No evidence of thrombus. Normal
compressibility.

Venous Reflux:  None.

Other Findings: No evidence of superficial thrombophlebitis or
abnormal fluid collection.

LEFT LOWER EXTREMITY

Common Femoral Vein: No evidence of thrombus. Normal
compressibility, respiratory phasicity and response to augmentation.

Saphenofemoral Junction: No evidence of thrombus. Normal
compressibility and flow on color Doppler imaging.

Profunda Femoral Vein: No evidence of thrombus. Normal
compressibility and flow on color Doppler imaging.

Femoral Vein: No evidence of thrombus. Normal compressibility,
respiratory phasicity and response to augmentation.

Popliteal Vein: No evidence of thrombus. Normal compressibility,
respiratory phasicity and response to augmentation.

Calf Veins: No evidence of thrombus. Normal compressibility and flow
on color Doppler imaging.

Superficial Great Saphenous Vein: No evidence of thrombus. Normal
compressibility.

Venous Reflux:  None.

Other Findings: No evidence of superficial thrombophlebitis or
abnormal fluid collection.
IMPRESSION: No evidence of deep venous thrombosis in either lower extremity.

## 2022-09-14 ENCOUNTER — Encounter: Payer: Self-pay | Admitting: Urology

## 2022-09-14 ENCOUNTER — Ambulatory Visit (INDEPENDENT_AMBULATORY_CARE_PROVIDER_SITE_OTHER): Payer: Medicare Other | Admitting: Urology

## 2022-09-14 VITALS — BP 110/71 | HR 65 | Ht 66.0 in | Wt 132.0 lb

## 2022-09-14 DIAGNOSIS — N4342 Spermatocele of epididymis, multiple: Secondary | ICD-10-CM

## 2022-09-14 NOTE — Progress Notes (Signed)
09/14/2022 3:57 PM   Andres [name] [birth year] [id number]  Referring provider: Herbert Pun, [place],  [place] [address]  Chief Complaint  Patient presents with   spermatocele    HPI: 56 year old male who presents today for further evaluation of a right spermatocele.  He is accompanied today by his 2 sisters which are his healthcare proxy.  Initially, he was referred to Dr. Windell Moment for further evaluation was felt to be a right inguinal hernia noticed by his healthcare facility.  Upon further review with a scrotal ultrasound on September 6, this in fact appears to be right spermatocele and he was referred here for this.  Patient reports today that its been there for many years and stable.  It does not bother him.  He wears pull-ups.  He denies any heaviness, tenderness, or pain related to this finding.   PMH: Past Medical History:  Diagnosis Date   Allergy    Essential hypertension    Hypothyroidism    Tardive dyskinesia     Surgical History: Past Surgical History:  Procedure Laterality Date   ANKLE SURGERY     COLON SURGERY     HERNIA REPAIR     INTRAMEDULLARY (IM) NAIL INTERTROCHANTERIC Right 06/18/2019   Procedure: INTRAMEDULLARY (IM) NAIL INTERTROCHANTRIC;  Surgeon: Lovell Sheehan, MD;  Location: [facility];  Service: Orthopedics;  Laterality: Right;   INTRAMEDULLARY (IM) NAIL INTERTROCHANTERIC Left 02/18/2022   Procedure: INTRAMEDULLARY (IM) NAIL INTERTROCHANTRIC;  Surgeon: Lovell Sheehan, MD;  Location: [facility];  Service: Orthopedics;  Laterality: Left;   TONSILLECTOMY      Home Medications:  Allergies as of 09/14/2022       Reactions   Prednisone Other (See Comments)   Did not like the way it made him feel  Did not like the way it made him feel    Lamotrigine Rash        Medication List        Accurate as of September 14, 2022  3:57 PM. If you have any questions, ask your nurse or doctor.          STOP taking  these medications    cholecalciferol 1000 units tablet Commonly known as: VITAMIN D Stopped by: Hollice Espy, MD   docusate sodium 100 MG capsule Commonly known as: COLACE Stopped by: Hollice Espy, MD   oxyCODONE-acetaminophen 5-325 MG tablet Commonly known as: PERCOCET/ROXICET Stopped by: Hollice Espy, MD   QUEtiapine 25 MG tablet Commonly known as: SEROquel Stopped by: Hollice Espy, MD       TAKE these medications    allopurinol 100 MG tablet Commonly known as: ZYLOPRIM TAKE 1 TABLET(100 MG) BY MOUTH DAILY   citalopram 40 MG tablet Commonly known as: CELEXA TAKE 1 TABLET(40 MG) BY MOUTH DAILY   enoxaparin 40 MG/0.4ML injection Commonly known as: LOVENOX Inject 0.4 mLs (40 mg total) into the skin daily for 14 days.   fexofenadine 180 MG tablet Commonly known as: ALLEGRA Take 180 mg by mouth.   ibuprofen 200 MG tablet Commonly known as: ADVIL Take 200 mg by mouth daily after supper.   levothyroxine 25 MCG tablet Commonly known as: SYNTHROID TAKE 1 TABLET BY MOUTH DAILY BEFORE BREAKFAST ON AN EMPTY STOMACH( NOT TO TAKE WITH OTHER MEDICINES)   ondansetron 4 MG tablet Commonly known as: ZOFRAN Take by mouth.   propranolol 10 MG tablet Commonly known as: INDERAL Take 5 mg by mouth 2 (two) times daily. For severe anxiety/agitation  thiamine 100 MG tablet Commonly known as: Vitamin B-1 Take 100 mg by mouth daily.   traZODone 50 MG tablet Commonly known as: DESYREL Take 1 tablet (50 mg total) by mouth at bedtime. Dose change   valbenazine 80 MG capsule Commonly known as: Ingrezza Take 1 capsule (80 mg total) by mouth at bedtime.        Allergies:  Allergies  Allergen Reactions   Prednisone Other (See Comments)    Did not like the way it made him feel  Did not like the way it made him feel    Lamotrigine Rash    Family History: Family History  Problem Relation Age of Onset   Diabetes Mother    Stroke Mother    Cancer Father         colon   Colon cancer Father    Colon cancer Other    Mental illness Neg Hx     Social History:  reports that he has never smoked. He has never used smokeless tobacco. He reports that he does not drink alcohol and does not use drugs.   Physical Exam: BP 110/71   Pulse 65   Ht 5\' 6"  (1.676 m)   Wt 132 lb (59.9 kg)   BMI 21.31 kg/m   Constitutional:  Alert and oriented, No acute distress. HEENT: [place] AT, moist mucus membranes.  Trachea midline, no masses. Cardiovascular: No clubbing, cyanosis, or edema. GU: Exam chaperoned by sisters.  Large discrete fluid-filled cystic structure, approximately 7 cm superior to the right testicle.  This appears separate from the testicle itself which is normal.  It extends into the inguinal canal but there is no mass in the proximal cord or the inguinal ring, this consistent with a large spermatocele.  Left hemiscrotum is normal.  Overlying skin is normal.  No tenderness.  She Neurologic: Grossly intact, no focal deficits, moving all 4 extremities. Psychiatric: Normal mood and affect.  Laboratory Data: Lab Results  Component Value Date   WBC 4.4 02/22/2022   HGB 8.6 (L) 02/22/2022   HCT 25.9 (L) 02/22/2022   MCV 92.8 02/22/2022   PLT 174 02/22/2022    Lab Results  Component Value Date   CREATININE 0.91 02/19/2022    Lab Results  Component Value Date   PSA 0.63 10/08/2020   PSA 1.0 09/24/2019      Lab Results  Component Value Date   HGBA1C 4.8 09/24/2019  Pertinent Imaging: CLINICAL DATA:  Hydrocele   EXAM: SCROTAL ULTRASOUND   DOPPLER ULTRASOUND OF THE TESTICLES   TECHNIQUE: Complete ultrasound examination of the testicles, epididymis, and other scrotal structures was performed. Color and spectral Doppler ultrasound were also utilized to evaluate blood flow to the testicles.   COMPARISON:  08/25/2005   FINDINGS: Right testicle   Measurements: 4.7 x 2.6 x 3.0 cm. Normal echogenicity without mass or calcification.  Internal blood flow present on color Doppler imaging.   Left testicle   Measurements: 4.2 x 2.6 x 2.6 cm. Normal echogenicity without mass or calcification. Internal blood flow present on color Doppler imaging.   Right epididymis: No normal appearing RIGHT epididymis visualized. Two large complex collections are identified in the RIGHT hemiscrotum medial and superior to the RIGHT testis, measuring 7.6 x 4.4 x 5.1 cm and 2.9 x 2.4 x 1.4 cm. Both of these collections contain diffuse mobile internal echogenicity, greater within the larger collection, most consistent with a large spermatoceles.   Left epididymis:  Normal in size and appearance.  Hydrocele:  None visualized.   Varicocele:  None visualized.   Pulsed Doppler interrogation of both testes demonstrates normal low resistance arterial and venous waveforms bilaterally.   IMPRESSION: Normal appearing testes and LEFT epididymis.   Two large probable spermatoceles within the RIGHT hemiscrotum measuring 7.6 cm and 2.9 cm in greatest sizes.     Electronically Signed   By: Lavonia Dana M.D.   On: 09/01/2022 16:57   I personally reviewed the above report and images and agree with radiologic interpretation.  Assessment & Plan:    1. Spermatocele of epididymis, multiple Asymptomatic incidental right hydrocele, chronic  We discussed the pathophysiology of this.  Given his not having any pain, I would recommend abstaining from surgical intervention.  If it does become symptomatic, symptoms could include tenderness, heaviness in the area.  At that point, would recommend spermatocele excision in the operating room.  We discussed risk and benefits of this.  Aspiration with likely not be helpful with high risk of recurrence.  Both he and his family are agreeable this plan.  He will follow-up as needed  Hollice Espy, MD  [facility] [address], [place], [place] [address] [phone]

## 2022-09-14 NOTE — Patient Instructions (Signed)
Spermatocele  A spermatocele is a fluid-filled sac (cyst) inside the sac that holds the testicles (scrotum). This type of cyst often forms in the epididymis. The epididymis is a coiled tube at the top of each testicle, and this tube is where sperm are stored. The cyst sometimes forms along a tube called the vas deferens, which is a tube that carries sperm away from the epididymis. Spermatoceles are usually painless. Most cysts are small, but they can grow larger. Spermatoceles are not cancerous (are benign). What are the causes? The cause of this condition is not known. However, this condition usually results from a blockage in one of the many small tubes (tubules) that carry sperm from your testicle to your vas deferens. What are the signs or symptoms? In most cases, small cysts do not cause symptoms. However, symptoms sometimes occur. Symptoms of this condition include: Dull pain. A feeling of heaviness. An enlarged scrotum, if your cyst is large. How is this diagnosed? This condition is diagnosed based on a physical exam. You or your health care provider may notice your cyst when feeling your scrotum. Your health care provider may shine a light through (transilluminate) your scrotum to see if light will pass through your cyst. You may have an ultrasound of the scrotum to rule out a tumor. How is this treated? Small spermatoceles do not need to be treated. If your spermatocele has grown large or is uncomfortable, your health care provider may recommend surgery to remove it. Follow these instructions at home: Check your spermatocele regularly for any changes. Do regular self-exams of your scrotum. Keep all follow-up visits. This is important. Contact a health care provider if: Your spermatocele gets larger. You have pain in your scrotum. Your spermatocele comes back after treatment. Get help right away if: You experience severe pain and redness of your scrotum. Summary A spermatocele  is a fluid-filled sac, or a cyst, inside the sac that holds the testicles (scrotum). This condition is usually painless, and it is not cancerous (is benign). Your health care provider may recommend surgery to remove your spermatocele if it grows large or is uncomfortable. If you have a spermatocele, check for any changes and do self-exams of your scrotum. Keep all follow-up visits. This is important. This information is not intended to replace advice given to you by your health care provider. Make sure you discuss any questions you have with your health care provider. Document Revised: 08/03/2021 Document Reviewed: 08/03/2021 Elsevier Patient Education  2023 Elsevier Inc.   

## 2022-10-25 ENCOUNTER — Encounter (INDEPENDENT_AMBULATORY_CARE_PROVIDER_SITE_OTHER): Payer: Self-pay

## 2022-10-26 ENCOUNTER — Other Ambulatory Visit: Payer: Self-pay | Admitting: Neurology

## 2022-10-26 DIAGNOSIS — G2119 Other drug induced secondary parkinsonism: Secondary | ICD-10-CM

## 2022-11-02 ENCOUNTER — Ambulatory Visit (INDEPENDENT_AMBULATORY_CARE_PROVIDER_SITE_OTHER): Payer: Medicare Other | Admitting: Nurse Practitioner

## 2022-11-05 ENCOUNTER — Ambulatory Visit (INDEPENDENT_AMBULATORY_CARE_PROVIDER_SITE_OTHER): Payer: Medicare Other | Admitting: Nurse Practitioner

## 2022-11-10 NOTE — Progress Notes (Unsigned)
MRN : [id number]  Andres [name] is a 56 y.o. (1966/01/30) male who presents with chief complaint of legs swell.  History of Present Illness:   The patient returns to the office for followup evaluation regarding leg swelling. He is a poor historian living in a group home.  At the time of his last visit in May he was under going several in his swelling had become less well controlled.  The swelling has persisted but with the lymph pump is under much, much better controlled. The pain associated with swelling is decreased. There have not been any interval development of a ulcerations or wounds.  The patient denies problems with the pump, noting it is working well and the leggings are in good condition.  Since the previous visit the patient has been wearing graduated compression stockings and using the lymph pump on a routine basis and  has noted significant improvement in the lymphedema.   Patient stated the lymph pump has been helpful with the treatment of the lymphedema.   No outpatient medications have been marked as taking for the 11/11/22 encounter (Appointment) with Delana Meyer, Dolores Lory, MD.    Past Medical History:  Diagnosis Date   Allergy    Essential hypertension    Hypothyroidism    Tardive dyskinesia     Past Surgical History:  Procedure Laterality Date   ANKLE SURGERY     COLON SURGERY     HERNIA REPAIR     INTRAMEDULLARY (IM) NAIL INTERTROCHANTERIC Right 06/18/2019   Procedure: INTRAMEDULLARY (IM) NAIL INTERTROCHANTRIC;  Surgeon: Lovell Sheehan, MD;  Location: [facility];  Service: Orthopedics;  Laterality: Right;   INTRAMEDULLARY (IM) NAIL INTERTROCHANTERIC Left 02/18/2022   Procedure: INTRAMEDULLARY (IM) NAIL INTERTROCHANTRIC;  Surgeon: Lovell Sheehan, MD;  Location: [facility];  Service: Orthopedics;  Laterality: Left;   TONSILLECTOMY      Social History Social History   Tobacco Use   Smoking status: Never   Smokeless tobacco: Never  Vaping Use   Vaping Use:  Never used  Substance Use Topics   Alcohol use: No   Drug use: No    Family History Family History  Problem Relation Age of Onset   Diabetes Mother    Stroke Mother    Cancer Father        colon   Colon cancer Father    Colon cancer Other    Mental illness Neg Hx     Allergies  Allergen Reactions   Prednisone Other (See Comments)    Did not like the way it made him feel  Did not like the way it made him feel    Lamotrigine Rash     REVIEW OF SYSTEMS (Negative unless checked)  Constitutional: [] Weight loss  [] Fever  [] Chills Cardiac: [] Chest pain   [] Chest pressure   [] Palpitations   [] Shortness of breath when laying flat   [] Shortness of breath with exertion. Vascular:  [] Pain in legs with walking   [x] Pain in legs with standing  [] History of DVT   [] Phlebitis   [x] Swelling in legs   [] Varicose veins   [] Non-healing ulcers Pulmonary:   [] Uses home oxygen   [] Productive cough   [] Hemoptysis   [] Wheeze  [] COPD   [] Asthma Neurologic:  [] Dizziness   [] Seizures   [] History of stroke   [] History of TIA  [] Aphasia   [] Vissual changes   [] Weakness or numbness in arm   [] Weakness or numbness in leg Musculoskeletal:   [] Joint swelling   []   Joint pain   [] Low back pain Hematologic:  [] Easy bruising  [] Easy bleeding   [] Hypercoagulable state   [] Anemic Gastrointestinal:  [] Diarrhea   [] Vomiting  [] Gastroesophageal reflux/heartburn   [] Difficulty swallowing. Genitourinary:  [] Chronic kidney disease   [] Difficult urination  [] Frequent urination   [] Blood in urine Skin:  [] Rashes   [] Ulcers  Psychological:  [] History of anxiety   []  History of major depression.  Physical Examination  There were no vitals filed for this visit. There is no height or weight on file to calculate BMI. Gen: WD/WN, NAD Head: [place]/AT, No temporalis wasting.  Ear/Nose/Throat: Hearing grossly intact, nares w/o erythema or drainage, pinna without lesions Eyes: PER, EOMI, sclera nonicteric.  Neck: Supple, no gross  masses.  No JVD.  Pulmonary:  Good air movement, no audible wheezing, no use of accessory muscles.  Cardiac: RRR, precordium not hyperdynamic. Vascular:  scattered varicosities present bilaterally.  Mild venous stasis changes to the legs bilaterally.  3-4+ soft pitting edema, CEAP [id number]  Vessel Right Left  Radial Palpable Palpable  Gastrointestinal: soft, non-distended. No guarding/no peritoneal signs.  Musculoskeletal: M/S 5/5 throughout.  No deformity.  Neurologic: CN 2-12 intact. Pain and light touch intact in extremities.  Symmetrical.  Speech is fluent. Motor exam as listed above. Psychiatric: Judgment intact, Mood & affect appropriate for pt's clinical situation. Dermatologic: Venous rashes no ulcers noted.  No changes consistent with cellulitis. Lymph : No lichenification or skin changes of chronic lymphedema.  CBC Lab Results  Component Value Date   WBC 4.4 02/22/2022   HGB 8.6 (L) 02/22/2022   HCT 25.9 (L) 02/22/2022   MCV 92.8 02/22/2022   PLT 174 02/22/2022    BMET    Component Value Date/Time   NA 142 02/19/2022 0532   K 3.7 02/19/2022 0532   CL 111 02/19/2022 0532   CO2 21 (L) 02/19/2022 0532   GLUCOSE 142 (H) 02/19/2022 0532   BUN 24 (H) 02/19/2022 0532   CREATININE 0.91 02/19/2022 0532   CREATININE 1.03 10/08/2020 0820   CALCIUM 8.4 (L) 02/19/2022 0532   GFRNONAA >60 02/19/2022 0532   GFRNONAA 81 10/08/2020 0820   GFRAA 94 10/08/2020 0820   CrCl cannot be calculated (Patient's most recent lab result is older than the maximum 21 days allowed.).  COAG Lab Results  Component Value Date   INR 1.0 02/17/2022   INR 0.9 06/17/2019    Radiology No results found.   Assessment/Plan There are no diagnoses linked to this encounter.   02/24/2022, MD  11/10/2022 1:16 PM

## 2022-11-11 ENCOUNTER — Encounter (INDEPENDENT_AMBULATORY_CARE_PROVIDER_SITE_OTHER): Payer: Self-pay | Admitting: Vascular Surgery

## 2022-11-11 ENCOUNTER — Ambulatory Visit (INDEPENDENT_AMBULATORY_CARE_PROVIDER_SITE_OTHER): Payer: Medicare Other | Admitting: Vascular Surgery

## 2022-11-11 VITALS — BP 102/66 | HR 54 | Resp 18 | Ht 66.0 in | Wt 132.0 lb

## 2022-11-11 DIAGNOSIS — I872 Venous insufficiency (chronic) (peripheral): Secondary | ICD-10-CM | POA: Diagnosis not present

## 2022-11-11 DIAGNOSIS — I10 Essential (primary) hypertension: Secondary | ICD-10-CM | POA: Diagnosis not present

## 2022-11-11 DIAGNOSIS — I890 Lymphedema, not elsewhere classified: Secondary | ICD-10-CM | POA: Diagnosis not present

## 2022-11-11 DIAGNOSIS — I5032 Chronic diastolic (congestive) heart failure: Secondary | ICD-10-CM | POA: Diagnosis not present

## 2022-12-09 ENCOUNTER — Other Ambulatory Visit: Payer: Self-pay | Admitting: Psychiatry

## 2022-12-09 DIAGNOSIS — G2401 Drug induced subacute dyskinesia: Secondary | ICD-10-CM

## 2022-12-21 ENCOUNTER — Ambulatory Visit
Admission: RE | Admit: 2022-12-21 | Discharge: 2022-12-21 | Disposition: A | Discharge status: Home / Self Care | Payer: Medicare Other | Source: Ambulatory Visit | Attending: Neurology | Admitting: Neurology

## 2022-12-21 DIAGNOSIS — G2119 Other drug induced secondary parkinsonism: Secondary | ICD-10-CM | POA: Insufficient documentation

## 2023-01-07 ENCOUNTER — Ambulatory Visit
Admission: RE | Admit: 2023-01-07 | Discharge: 2023-01-07 | Disposition: A | Discharge status: Home / Self Care | Payer: Medicare Other | Attending: Gastroenterology | Admitting: Gastroenterology

## 2023-01-07 ENCOUNTER — Encounter
Admission: RE | Disposition: A | Discharge status: Home / Self Care | Payer: Self-pay | Source: Home / Self Care | Attending: Gastroenterology

## 2023-01-07 ENCOUNTER — Other Ambulatory Visit: Payer: Self-pay

## 2023-01-07 ENCOUNTER — Encounter: Payer: Self-pay | Admitting: *Deleted

## 2023-01-07 ENCOUNTER — Ambulatory Visit: Payer: Medicare Other | Admitting: Anesthesiology

## 2023-01-07 DIAGNOSIS — Z800 Family history of malignant neoplasm of digestive organs: Secondary | ICD-10-CM | POA: Diagnosis not present

## 2023-01-07 DIAGNOSIS — I10 Essential (primary) hypertension: Secondary | ICD-10-CM | POA: Insufficient documentation

## 2023-01-07 DIAGNOSIS — F319 Bipolar disorder, unspecified: Secondary | ICD-10-CM | POA: Diagnosis not present

## 2023-01-07 DIAGNOSIS — E039 Hypothyroidism, unspecified: Secondary | ICD-10-CM | POA: Diagnosis not present

## 2023-01-07 DIAGNOSIS — R634 Abnormal weight loss: Secondary | ICD-10-CM | POA: Diagnosis not present

## 2023-01-07 HISTORY — PX: COLONOSCOPY WITH PROPOFOL: SHX5780

## 2023-01-07 SURGERY — COLONOSCOPY WITH PROPOFOL
Anesthesia: General

## 2023-01-07 MED ORDER — PROPOFOL 1000 MG/100ML IV EMUL
INTRAVENOUS | Status: AC
  Filled 2023-01-07: qty 100

## 2023-01-07 MED ORDER — GLYCOPYRROLATE 0.2 MG/ML IJ SOLN
INTRAMUSCULAR | Status: DC | PRN
  Administered 2023-01-07: .2 mg via INTRAVENOUS

## 2023-01-07 MED ORDER — PHENYLEPHRINE HCL (PRESSORS) 10 MG/ML IV SOLN
INTRAVENOUS | Status: AC
  Filled 2023-01-07: qty 1

## 2023-01-07 MED ORDER — PHENYLEPHRINE HCL (PRESSORS) 10 MG/ML IV SOLN
INTRAVENOUS | Status: DC | PRN
  Administered 2023-01-07: 80 ug via INTRAVENOUS

## 2023-01-07 MED ORDER — PROPOFOL 10 MG/ML IV BOLUS
INTRAVENOUS | Status: DC | PRN
  Administered 2023-01-07: 20 mg via INTRAVENOUS

## 2023-01-07 MED ORDER — SODIUM CHLORIDE 0.9 % IV SOLN: INTRAVENOUS | Status: DC

## 2023-01-07 MED ORDER — PROPOFOL 500 MG/50ML IV EMUL
INTRAVENOUS | Status: DC | PRN
  Administered 2023-01-07: 200 ug/kg/min via INTRAVENOUS

## 2023-01-07 NOTE — Anesthesia Preprocedure Evaluation (Signed)
Anesthesia Evaluation  Patient identified by MRN, date of birth, ID band Patient awake    Reviewed: Allergy & Precautions, NPO status , Patient's Chart, lab work & pertinent test results  Airway Mallampati: III  TM Distance: >3 FB Neck ROM: full    Dental  (+) Poor Dentition   Pulmonary neg pulmonary ROS   Pulmonary exam normal        Cardiovascular hypertension, On Medications negative cardio ROS Normal cardiovascular exam     Neuro/Psych  PSYCHIATRIC DISORDERS (schizoaffective disorder)      Drug induced Parkinsons  Tardive dyskinesia  negative neurological ROS     GI/Hepatic negative GI ROS, Neg liver ROS,,,  Endo/Other  Hypothyroidism    Renal/GU negative Renal ROS  negative genitourinary   Musculoskeletal   Abdominal   Peds  Hematology negative hematology ROS (+)   Anesthesia Other Findings Past Medical History: No date: Allergy No date: Essential hypertension No date: Hypothyroidism No date: Tardive dyskinesia  Past Surgical History: No date: ANKLE SURGERY No date: COLON SURGERY No date: HERNIA REPAIR 06/18/2019: INTRAMEDULLARY (IM) NAIL INTERTROCHANTERIC; Right     Comment:  Procedure: INTRAMEDULLARY (IM) NAIL INTERTROCHANTRIC;                Surgeon: Lovell Sheehan, MD;  Location: [facility];                Service: Orthopedics;  Laterality: Right; 02/18/2022: INTRAMEDULLARY (IM) NAIL INTERTROCHANTERIC; Left     Comment:  Procedure: INTRAMEDULLARY (IM) NAIL INTERTROCHANTRIC;                Surgeon: Lovell Sheehan, MD;  Location: [facility];                Service: Orthopedics;  Laterality: Left; No date: TONSILLECTOMY     Reproductive/Obstetrics negative OB ROS                             Anesthesia Physical Anesthesia Plan  ASA: 3  Anesthesia Plan: General   Post-op Pain Management: Minimal or no pain anticipated   Induction: Intravenous  PONV Risk Score and  Plan: Propofol infusion and TIVA  Airway Management Planned: Natural Airway and Nasal Cannula  Additional Equipment:   Intra-op Plan:   Post-operative Plan:   Informed Consent: I have reviewed the patients History and Physical, chart, labs and discussed the procedure including the risks, benefits and alternatives for the proposed anesthesia with the patient or authorized representative who has indicated his/her understanding and acceptance.     Dental Advisory Given  Plan Discussed with: Anesthesiologist, CRNA and Surgeon  Anesthesia Plan Comments: (Patient consented for risks of anesthesia including but not limited to:  - adverse reactions to medications - risk of airway placement if required - damage to eyes, teeth, lips or other oral mucosa - nerve damage due to positioning  - sore throat or hoarseness - Damage to heart, brain, nerves, lungs, other parts of body or loss of life  Patient voiced understanding.)        Anesthesia Quick Evaluation

## 2023-01-07 NOTE — Transfer of Care (Signed)
Immediate Anesthesia Transfer of Care Note  Patient: Andres [name]  Procedure(s) Performed: COLONOSCOPY WITH PROPOFOL  Patient Location: PACU  Anesthesia Type:General  Level of Consciousness: sedated  Airway & Oxygen Therapy: Patient Spontanous Breathing and Patient connected to nasal cannula oxygen  Post-op Assessment: Report given to RN and Post -op Vital signs reviewed and stable  Post vital signs: Reviewed and stable  Last Vitals:  Vitals Value Taken Time  BP 95/67 01/07/23 0911  Temp 35.1 C 01/07/23 0910  Pulse 56 01/07/23 0912  Resp 11 01/07/23 0912  SpO2 100 % 01/07/23 0912  Vitals shown include unvalidated device data.  Last Pain:  Vitals:   01/07/23 0910  TempSrc: Temporal  PainSc: Asleep         Complications: No notable events documented.

## 2023-01-07 NOTE — Anesthesia Procedure Notes (Signed)
Date/Time: 01/07/2023 8:49 AM  Performed by: Nelda Marseille, CRNAPre-anesthesia Checklist: Patient identified, Emergency Drugs available, Suction available, Patient being monitored and Timeout performed Oxygen Delivery Method: Nasal cannula

## 2023-01-07 NOTE — Op Note (Signed)
[facility]enterology Patient Name: Andres [name] Procedure Date: 01/07/2023 7:07 AM MRN: [id number] Account #: [id number] Date of Birth: September 10, 1966 Admit Type: Outpatient Age: 57 Room: [facility] ENDO ROOM 3 Gender: Male Note Status: Supervisor Override Instrument Name: Peds Colonoscope [id number] Procedure:             Colonoscopy Indications:           Family history of colon cancer, Weight loss Providers:             Andrey Farmer MD, MD Referring MD:          No Local Md, MD (Referring MD) Medicines:             Monitored Anesthesia Care Complications:         No immediate complications. Procedure:             Pre-Anesthesia Assessment:                        - Prior to the procedure, a History and Physical was                         performed, and patient medications and allergies were                         reviewed. The patient is unable to give consent                         secondary to the patient being legally incompetent to                         consent. The risks and benefits of the procedure and                         the sedation options and risks were discussed with the                         patient's sister. All questions were answered and                         informed consent was obtained. Patient identification                         and proposed procedure were verified by the physician,                         the nurse, the anesthesiologist, the anesthetist and                         the technician in the endoscopy suite. Mental Status                         Examination: alert but confused. Airway Examination:                         normal oropharyngeal airway and neck mobility.                         Respiratory Examination: clear to auscultation. CV  Examination: normal. Prophylactic Antibiotics: The                         patient does not require prophylactic antibiotics.                         Prior  Anticoagulants: The patient has taken no                         anticoagulant or antiplatelet agents. ASA Grade                         Assessment: III - A patient with severe systemic                         disease. After reviewing the risks and benefits, the                         patient was deemed in satisfactory condition to                         undergo the procedure. The anesthesia plan was to use                         monitored anesthesia care (MAC). Immediately prior to                         administration of medications, the patient was                         re-assessed for adequacy to receive sedatives. The                         heart rate, respiratory rate, oxygen saturations,                         blood pressure, adequacy of pulmonary ventilation, and                         response to care were monitored throughout the                         procedure. The physical status of the patient was                         re-assessed after the procedure.                        After obtaining informed consent, the colonoscope was                         passed under direct vision. Throughout the procedure,                         the patient's blood pressure, pulse, and oxygen                         saturations were monitored continuously. The  Colonoscope was introduced through the anus with the                         intention of advancing to the cecum. The scope was                         advanced to the transverse colon before the procedure                         was aborted. Medications were given. The colonoscopy                         was aborted due to inadequate bowel prep and                         significant looping. Applying abdominal pressure did                         not allow for the successful completion of the                         procedure. The patient tolerated the procedure well.                         The  quality of the bowel preparation was poor. No                         anatomical landmarks were photographed. Findings:      The perianal and digital rectal examinations were normal.      The lumen of the colon (entire examined portion) was significantly       dilated. Impression:            - The procedure was aborted due to inadequate bowel                         prep and significant looping.                        - Preparation of the colon was poor.                        - Dilated in the entire examined colon.                        - No specimens collected. Recommendation:        - Discharge patient to home.                        - Resume previous diet.                        - Continue present medications.                        - Perform a virtual colonoscopy at appointment to be                         scheduled. Procedure Code(s):     --- Professional ---  78478, 53, Colonoscopy, flexible; diagnostic,                         including collection of specimen(s) by brushing or                         washing, when performed (separate procedure) Diagnosis Code(s):     --- Professional ---                        K59.39, Other megacolon                        R63.4, Abnormal weight loss CPT copyright 2022 American Medical Association. All rights reserved. The codes documented in this report are preliminary and upon coder review may  be revised to meet current compliance requirements. Andrey Farmer MD, MD 01/07/2023 9:16:49 AM Number of Addenda: 0 Note Initiated On: 01/07/2023 7:07 AM Total Procedure Duration: 0 hours 20 minutes 12 seconds  Estimated Blood Loss:  Estimated blood loss: none.      [facility] [place]

## 2023-01-07 NOTE — Interval H&P Note (Signed)
History and Physical Interval Note:  01/07/2023 8:39 AM  Andres [name]  has presented today for surgery, with the diagnosis of Weight loss FH Colon Cancer.  The various methods of treatment have been discussed with the patient and family. After consideration of risks, benefits and other options for treatment, the patient has consented to  Procedure(s) with comments: COLONOSCOPY WITH PROPOFOL (N/A) - 1st Case as a surgical intervention.  The patient's history has been reviewed, patient examined, no change in status, stable for surgery.  I have reviewed the patient's chart and labs.  Questions were answered to the patient's satisfaction.     Lesly Rubenstein  Ok to proceed with colonoscopy

## 2023-01-07 NOTE — Anesthesia Postprocedure Evaluation (Signed)
Anesthesia Post Note  Patient: Andres [name]  Procedure(s) Performed: COLONOSCOPY WITH PROPOFOL  Patient location during evaluation: Endoscopy Anesthesia Type: General Level of consciousness: awake and alert Pain management: pain level controlled Vital Signs Assessment: post-procedure vital signs reviewed and stable Respiratory status: spontaneous breathing, nonlabored ventilation, respiratory function stable and patient connected to nasal cannula oxygen Cardiovascular status: blood pressure returned to baseline and stable Postop Assessment: no apparent nausea or vomiting Anesthetic complications: no   No notable events documented.   Last Vitals:  Vitals:   01/07/23 0910 01/07/23 0922  BP: 95/67 107/80  Pulse: (!) 56 62  Resp: 11 20  Temp: (!) 35.1 C   SpO2: 100% 100%    Last Pain:  Vitals:   01/07/23 0922  TempSrc:   PainSc: 0-No pain                 Ilene Qua

## 2023-01-07 NOTE — H&P (Signed)
Outpatient short stay form Pre-procedure 01/07/2023  Lesly Rubenstein, MD  Primary Physician: Verl Blalock, NP  Reason for visit:  Weight loss  History of present illness:    57 y/o gentleman with hypothyroidism, Bipolar, and hypertension here for colonoscopy for weight loss. Last colonoscopy in 2016 was normal. Father had colon cancer in his mid 29's although exact age is unclear. No blood thinners. No significant abdominal surgeries.    Current Facility-Administered Medications:    0.9 %  sodium chloride infusion, , Intravenous, Continuous, Marcheta Horsey, Hilton Cork, MD, Last Rate: 20 mL/hr at 01/07/23 0754, New Bag at 01/07/23 0754  Medications Prior to Admission  Medication Sig Dispense Refill Last Dose   allopurinol (ZYLOPRIM) 100 MG tablet TAKE 1 TABLET(100 MG) BY MOUTH DAILY 90 tablet 3 01/06/2023   citalopram (CELEXA) 40 MG tablet TAKE 1 TABLET(40 MG) BY MOUTH DAILY 90 tablet 2 01/06/2023   levothyroxine (SYNTHROID) 25 MCG tablet TAKE 1 TABLET BY MOUTH DAILY BEFORE BREAKFAST ON AN EMPTY STOMACH( NOT TO TAKE WITH OTHER MEDICINES) 90 tablet 3 01/06/2023   risperiDONE (RISPERDAL) 0.5 MG tablet Take 0.5 mg by mouth 2 (two) times daily.   01/06/2023   thiamine (VITAMIN B-1) 100 MG tablet Take 100 mg by mouth daily.   01/06/2023   traZODone (DESYREL) 50 MG tablet Take 1 tablet (50 mg total) by mouth at bedtime. Dose change 90 tablet 1 01/06/2023   valbenazine (INGREZZA) 80 MG capsule Take 1 capsule (80 mg total) by mouth at bedtime. 30 capsule 7 01/06/2023   enoxaparin (LOVENOX) 40 MG/0.4ML injection Inject 0.4 mLs (40 mg total) into the skin daily for 14 days. 0 mL 0    fexofenadine (ALLEGRA) 180 MG tablet Take 180 mg by mouth.      ibuprofen (ADVIL) 200 MG tablet Take 200 mg by mouth daily after supper.      ondansetron (ZOFRAN) 4 MG tablet Take by mouth.      propranolol (INDERAL) 10 MG tablet Take 5 mg by mouth 2 (two) times daily. For severe anxiety/agitation 180 tablet 2    QUEtiapine  (SEROQUEL) 50 MG tablet Take 50 mg by mouth 3 (three) times daily. (Patient not taking: Reported on 01/07/2023)   Not Taking     Allergies  Allergen Reactions   Prednisone Other (See Comments)    Did not like the way it made him feel  Did not like the way it made him feel    Lamotrigine Rash     Past Medical History:  Diagnosis Date   Allergy    Essential hypertension    Hypothyroidism    Tardive dyskinesia     Review of systems:  Otherwise negative.    Physical Exam  Gen: Alert, oriented. Appears stated age.  HEENT: PERRLA. Lungs: No respiratory distress CV: RRR Abd: soft, benign, no masses Ext: No edema    Planned procedures: Proceed with colonoscopy. The patient understands the nature of the planned procedure, indications, risks, alternatives and potential complications including but not limited to bleeding, infection, perforation, damage to internal organs and possible oversedation/side effects from anesthesia. The patient agrees and gives consent to proceed.  Please refer to procedure notes for findings, recommendations and patient disposition/instructions.     Lesly Rubenstein, MD [facility]enterology

## 2023-01-08 NOTE — Progress Notes (Signed)
Spoke with Butch Penny (Sister and Healthcare POA).  She states patient is doing well.

## 2023-01-10 ENCOUNTER — Encounter: Payer: Self-pay | Admitting: Gastroenterology

## 2023-01-13 ENCOUNTER — Other Ambulatory Visit: Payer: Self-pay | Admitting: Gastroenterology

## 2023-01-13 DIAGNOSIS — R634 Abnormal weight loss: Secondary | ICD-10-CM

## 2023-02-05 ENCOUNTER — Observation Stay
Admission: EM | Admit: 2023-02-05 | Discharge: 2023-02-06 | Disposition: A | Discharge status: Home / Self Care | Payer: Medicare Other | Attending: Internal Medicine | Admitting: Internal Medicine

## 2023-02-05 ENCOUNTER — Encounter: Payer: Self-pay | Admitting: Internal Medicine

## 2023-02-05 ENCOUNTER — Other Ambulatory Visit: Payer: Self-pay

## 2023-02-05 DIAGNOSIS — T461X1A Poisoning by calcium-channel blockers, accidental (unintentional), initial encounter: Secondary | ICD-10-CM | POA: Insufficient documentation

## 2023-02-05 DIAGNOSIS — T43501A Poisoning by unspecified antipsychotics and neuroleptics, accidental (unintentional), initial encounter: Secondary | ICD-10-CM | POA: Diagnosis not present

## 2023-02-05 DIAGNOSIS — I890 Lymphedema, not elsewhere classified: Secondary | ICD-10-CM | POA: Insufficient documentation

## 2023-02-05 DIAGNOSIS — G2119 Other drug induced secondary parkinsonism: Secondary | ICD-10-CM | POA: Diagnosis not present

## 2023-02-05 DIAGNOSIS — E039 Hypothyroidism, unspecified: Secondary | ICD-10-CM | POA: Diagnosis not present

## 2023-02-05 DIAGNOSIS — R6250 Unspecified lack of expected normal physiological development in childhood: Secondary | ICD-10-CM | POA: Diagnosis present

## 2023-02-05 DIAGNOSIS — T383X1A Poisoning by insulin and oral hypoglycemic [antidiabetic] drugs, accidental (unintentional), initial encounter: Secondary | ICD-10-CM | POA: Diagnosis not present

## 2023-02-05 DIAGNOSIS — T50901A Poisoning by unspecified drugs, medicaments and biological substances, accidental (unintentional), initial encounter: Secondary | ICD-10-CM

## 2023-02-05 DIAGNOSIS — F251 Schizoaffective disorder, depressive type: Secondary | ICD-10-CM | POA: Diagnosis present

## 2023-02-05 DIAGNOSIS — M1A9XX1 Chronic gout, unspecified, with tophus (tophi): Secondary | ICD-10-CM | POA: Insufficient documentation

## 2023-02-05 DIAGNOSIS — G2401 Drug induced subacute dyskinesia: Secondary | ICD-10-CM | POA: Diagnosis not present

## 2023-02-05 DIAGNOSIS — Z79899 Other long term (current) drug therapy: Secondary | ICD-10-CM | POA: Diagnosis not present

## 2023-02-05 DIAGNOSIS — E162 Hypoglycemia, unspecified: Secondary | ICD-10-CM | POA: Diagnosis present

## 2023-02-05 DIAGNOSIS — F419 Anxiety disorder, unspecified: Secondary | ICD-10-CM | POA: Diagnosis present

## 2023-02-05 DIAGNOSIS — I10 Essential (primary) hypertension: Secondary | ICD-10-CM | POA: Insufficient documentation

## 2023-02-05 DIAGNOSIS — T465X1A Poisoning by other antihypertensive drugs, accidental (unintentional), initial encounter: Secondary | ICD-10-CM | POA: Diagnosis present

## 2023-02-05 LAB — CBC WITH DIFFERENTIAL/PLATELET
Abs Immature Granulocytes: 0.01 10*3/uL (ref 0.00–0.07)
Basophils Absolute: 0 10*3/uL (ref 0.0–0.1)
Basophils Relative: 1 %
Eosinophils Absolute: 0.1 10*3/uL (ref 0.0–0.5)
Eosinophils Relative: 3 %
HCT: 37.2 % — ABNORMAL LOW (ref 39.0–52.0)
Hemoglobin: 12.8 g/dL — ABNORMAL LOW (ref 13.0–17.0)
Immature Granulocytes: 0 %
Lymphocytes Relative: 31 %
Lymphs Abs: 0.8 10*3/uL (ref 0.7–4.0)
MCH: 30.7 pg (ref 26.0–34.0)
MCHC: 34.4 g/dL (ref 30.0–36.0)
MCV: 89.2 fL (ref 80.0–100.0)
Monocytes Absolute: 0.2 10*3/uL (ref 0.1–1.0)
Monocytes Relative: 8 %
Neutro Abs: 1.4 10*3/uL — ABNORMAL LOW (ref 1.7–7.7)
Neutrophils Relative %: 57 %
Platelets: 91 10*3/uL — ABNORMAL LOW (ref 150–400)
RBC: 4.17 MIL/uL — ABNORMAL LOW (ref 4.22–5.81)
RDW: 13.8 % (ref 11.5–15.5)
WBC: 2.5 10*3/uL — ABNORMAL LOW (ref 4.0–10.5)
nRBC: 0 % (ref 0.0–0.2)

## 2023-02-05 LAB — CBG MONITORING, ED
Glucose-Capillary: 73 mg/dL (ref 70–99)
Glucose-Capillary: 66 mg/dL — ABNORMAL LOW (ref 70–99)
Glucose-Capillary: 163 mg/dL — ABNORMAL HIGH (ref 70–99)
Glucose-Capillary: >600 mg/dL (ref 70–99)
Glucose-Capillary: >600 mg/dL (ref 70–99)
Glucose-Capillary: 178 mg/dL — ABNORMAL HIGH (ref 70–99)
Glucose-Capillary: 73 mg/dL (ref 70–99)
Glucose-Capillary: 76 mg/dL (ref 70–99)

## 2023-02-05 LAB — GLUCOSE, RANDOM: Glucose, Bld: 68 mg/dL — ABNORMAL LOW (ref 70–99)

## 2023-02-05 LAB — COMPREHENSIVE METABOLIC PANEL
ALT: 13 U/L (ref 0–44)
AST: 30 U/L (ref 15–41)
Albumin: 3.8 g/dL (ref 3.5–5.0)
Alkaline Phosphatase: 56 U/L (ref 38–126)
Anion gap: 9 (ref 5–15)
BUN: 28 mg/dL — ABNORMAL HIGH (ref 6–20)
CO2: 22 mmol/L (ref 22–32)
Calcium: 8.6 mg/dL — ABNORMAL LOW (ref 8.9–10.3)
Chloride: 111 mmol/L (ref 98–111)
Creatinine, Ser: 0.96 mg/dL (ref 0.61–1.24)
GFR, Estimated: >60 mL/min (ref >60)
Glucose, Bld: 104 mg/dL — ABNORMAL HIGH (ref 70–99)
Potassium: 3.4 mmol/L — ABNORMAL LOW (ref 3.5–5.1)
Sodium: 142 mmol/L (ref 135–145)
Total Bilirubin: 0.7 mg/dL (ref 0.3–1.2)
Total Protein: 5.8 g/dL — ABNORMAL LOW (ref 6.5–8.1)

## 2023-02-05 LAB — PHOSPHORUS: Phosphorus: 2.5 mg/dL (ref 2.5–4.6)

## 2023-02-05 LAB — SALICYLATE LEVEL: Salicylate Lvl: <7 mg/dL — ABNORMAL LOW (ref 7.0–30.0)

## 2023-02-05 LAB — MAGNESIUM: Magnesium: 1.9 mg/dL (ref 1.7–2.4)

## 2023-02-05 LAB — ACETAMINOPHEN LEVEL: Acetaminophen (Tylenol), Serum: <10 ug/mL — ABNORMAL LOW (ref 10–30)

## 2023-02-05 LAB — ETHANOL: Alcohol, Ethyl (B): <10 mg/dL (ref <10)

## 2023-02-05 MED ORDER — LEVOTHYROXINE SODIUM 50 MCG PO TABS
25.0000 ug | ORAL_TABLET | Freq: Every day | ORAL | Status: DC
  Administered 2023-02-06: 25 ug via ORAL
  Filled 2023-02-05: qty 1

## 2023-02-05 MED ORDER — ALLOPURINOL 100 MG PO TABS
100.0000 mg | ORAL_TABLET | Freq: Every day | ORAL | Status: DC
  Filled 2023-02-05: qty 1

## 2023-02-05 MED ORDER — ONDANSETRON HCL 4 MG/2ML IJ SOLN: 4.0000 mg | Freq: Four times a day (QID) | INTRAMUSCULAR | Status: DC | PRN

## 2023-02-05 MED ORDER — DEXTROSE 10 % IV SOLN: INTRAVENOUS | Status: DC

## 2023-02-05 MED ORDER — MELATONIN 5 MG PO TABS
5.0000 mg | ORAL_TABLET | Freq: Every day | ORAL | Status: DC
  Administered 2023-02-05: 5 mg via ORAL
  Filled 2023-02-05: qty 1

## 2023-02-05 MED ORDER — ONDANSETRON HCL 4 MG PO TABS: 4.0000 mg | ORAL_TABLET | Freq: Four times a day (QID) | ORAL | Status: DC | PRN

## 2023-02-05 MED ORDER — SODIUM CHLORIDE 0.9 % IV BOLUS
250.0000 mL | Freq: Once | INTRAVENOUS | Status: AC
  Administered 2023-02-05: 250 mL via INTRAVENOUS

## 2023-02-05 MED ORDER — THIAMINE MONONITRATE 100 MG PO TABS
100.0000 mg | ORAL_TABLET | Freq: Every day | ORAL | Status: DC
  Administered 2023-02-05: 100 mg via ORAL
  Filled 2023-02-05: qty 1

## 2023-02-05 MED ORDER — LORAZEPAM 0.5 MG PO TABS: 0.5000 mg | ORAL_TABLET | Freq: Four times a day (QID) | ORAL | Status: AC | PRN

## 2023-02-05 MED ORDER — RISPERIDONE 1 MG PO TABS: 1.0000 mg | ORAL_TABLET | ORAL | Status: DC

## 2023-02-05 MED ORDER — HYDRALAZINE HCL 20 MG/ML IJ SOLN: 5.0000 mg | Freq: Three times a day (TID) | INTRAMUSCULAR | Status: DC | PRN

## 2023-02-05 MED ORDER — VITAMIN D 25 MCG (1000 UNIT) PO TABS: 2000.0000 [IU] | ORAL_TABLET | Freq: Every day | ORAL | Status: DC

## 2023-02-05 MED ORDER — PANTOPRAZOLE SODIUM 40 MG PO TBEC: 40.0000 mg | DELAYED_RELEASE_TABLET | Freq: Every day | ORAL | Status: DC

## 2023-02-05 MED ORDER — DOCUSATE SODIUM 100 MG PO CAPS: 100.0000 mg | ORAL_CAPSULE | Freq: Every day | ORAL | Status: DC

## 2023-02-05 MED ORDER — ENOXAPARIN SODIUM 40 MG/0.4ML IJ SOSY
40.0000 mg | PREFILLED_SYRINGE | INTRAMUSCULAR | Status: DC
  Administered 2023-02-05: 40 mg via SUBCUTANEOUS
  Filled 2023-02-05: qty 0.4

## 2023-02-05 MED ORDER — ACETAMINOPHEN 325 MG RE SUPP: 650.0000 mg | Freq: Four times a day (QID) | RECTAL | Status: DC | PRN

## 2023-02-05 MED ORDER — TRAZODONE HCL 50 MG PO TABS
50.0000 mg | ORAL_TABLET | Freq: Every day | ORAL | Status: DC
  Administered 2023-02-05: 50 mg via ORAL
  Filled 2023-02-05: qty 1

## 2023-02-05 MED ORDER — VALBENAZINE TOSYLATE 40 MG PO CAPS
80.0000 mg | ORAL_CAPSULE | Freq: Every day | ORAL | Status: DC
  Administered 2023-02-05: 80 mg via ORAL
  Filled 2023-02-05: qty 2

## 2023-02-05 MED ORDER — DEXTROSE 5 % IV BOLUS: 250.0000 mL | INTRAVENOUS | Status: DC | PRN

## 2023-02-05 MED ORDER — INSULIN ASPART 100 UNIT/ML IJ SOLN
15.0000 [IU] | Freq: Once | INTRAMUSCULAR | Status: AC
  Administered 2023-02-05: 15 [IU] via SUBCUTANEOUS
  Filled 2023-02-05: qty 1

## 2023-02-05 MED ORDER — BENZONATATE 100 MG PO CAPS: 200.0000 mg | ORAL_CAPSULE | Freq: Three times a day (TID) | ORAL | Status: DC | PRN

## 2023-02-05 MED ORDER — CITALOPRAM HYDROBROMIDE 20 MG PO TABS: 40.0000 mg | ORAL_TABLET | Freq: Every day | ORAL | Status: DC

## 2023-02-05 MED ORDER — ACETAMINOPHEN 325 MG PO TABS: 650.0000 mg | ORAL_TABLET | Freq: Four times a day (QID) | ORAL | Status: DC | PRN

## 2023-02-05 MED ORDER — DEXTROSE 10 % IV SOLN: INTRAVENOUS | Status: AC

## 2023-02-05 MED ORDER — SENNOSIDES-DOCUSATE SODIUM 8.6-50 MG PO TABS: 1.0000 | ORAL_TABLET | Freq: Every evening | ORAL | Status: DC | PRN

## 2023-02-05 NOTE — ED Notes (Addendum)
Patient given snacks and ginger ale after poc glucose testing. Patient showing no signs of distress. Patient snacking while resting in bed watching TV.

## 2023-02-05 NOTE — ED Notes (Signed)
Patient given snacks and juice while waiting for his lunch to arrive.

## 2023-02-05 NOTE — Assessment & Plan Note (Addendum)
-   Hydralazine 5 mg every 8 hours as needed for SBP>180, 4 days ordered

## 2023-02-05 NOTE — Hospital Course (Addendum)
Mr. Andres [name] is a 57 year old male with history of hypertension, hypothyroid, tardive dyskinesia, intellectual disability, episodic mood disorder, drug-induced parkinsonism, who presents emergency department from [place] group home after patient was accidentally administered his roommates medications.  Per chart notes, patient's roommate medication includes: Losartan 100 mg daily, hydralazine 25 mg, hydrochlorothiazide 12.5 mg, Jentadueto and glipizide 5 mg.  Of note patient was also given 1 dose of Prozac and Risperdal.  Patient only takes Celexa and Seroquel.  Initial vitals in ED showed temperature 97.4, respiration rate of 17, heart rate 59, blood pressure 138/85, SpO2 100% on room air.  Serum sodium is 142, potassium 3.4, chloride 111, bicarb 22, BUN of 28, serum creatinine of 0.96, EGFR greater than 60, nonfasting blood glucose 104, WBC 2.5, hemoglobin 12.8, platelets of 91.  Repeat blood glucose was 66. Serum Tylenol level was negative.  Salicylate acid level was negative.  ED treatment: Sodium chloride 250 mL bolus, dextrose infusion, apple juice.

## 2023-02-05 NOTE — Assessment & Plan Note (Signed)
-   Continue D10 infusion at 75 mL/h, 16 hours ordered - CBG monitoring, every 2 hours - Admit to telemetry medical, observation

## 2023-02-05 NOTE — H&P (Signed)
History and Physical   Andres [name] [id number] DOB: 04-10-1966 DOA: 02/05/2023  PCP: Verl Blalock, NP  Patient coming from: [place] via EMS  I have personally briefly reviewed patient's old medical records in [place].  Chief Concern: Incorrect medications administered  HPI: Mr. Andres [name] is a 57 year old male with history of hypertension, hypothyroid, tardive dyskinesia, intellectual disability, episodic mood disorder, drug-induced parkinsonism, who presents emergency department from [place] group home after patient was accidentally administered his roommates medications.  Per chart notes, patient's roommate medication includes: Losartan 100 mg daily, hydralazine 25 mg, hydrochlorothiazide 12.5 mg, Jentadueto and glipizide 5 mg.  Of note patient was also given 1 dose of Prozac and Risperdal.  Patient only takes Celexa and Seroquel.  Initial vitals in ED showed temperature 97.4, respiration rate of 17, heart rate 59, blood pressure 138/85, SpO2 100% on room air.  Serum sodium is 142, potassium 3.4, chloride 111, bicarb 22, BUN of 28, serum creatinine of 0.96, EGFR greater than 60, nonfasting blood glucose 104, WBC 2.5, hemoglobin 12.8, platelets of 91.  Repeat blood glucose was 66. Serum Tylenol level was negative.  Salicylate acid level was negative.  ED treatment: Sodium chloride 250 mL bolus, dextrose infusion, apple juice. ----------------------- At bedside, patient is able to tell me his name, age, current calendar year, current location of hospital.  He reports he is feeling fine, he denies being in pain, chest pain, shortness of breath.  He endorses bilateral hip pain, that he notes he has to take ibuprofen every day for before he can walk.  He reports he has arthritis.  40At bedside, he denies chest pain, shortness of breath, dysuria, hematuria, diarrhea, vision changes.  I counseled patient on risk of daily ibuprofen use including GI bleeding and  vomiting up blood.  He continued to repeat that he did not know that this can be a side effect.  He asked me what can be done.  I recommended alternating between ibuprofen and acetaminophen and engaging in physical activity and walking through the pain.  Social history: He lives in a group home.  ROS: Constitutional: no weight change, no fever ENT/Mouth: no sore throat, no rhinorrhea Eyes: no eye pain, no vision changes Cardiovascular: no chest pain, no dyspnea,  no edema, no palpitations Respiratory: no cough, no sputum, no wheezing Gastrointestinal: no nausea, no vomiting, no diarrhea, no constipation Genitourinary: no urinary incontinence, no dysuria, no hematuria Musculoskeletal: no arthralgias, no myalgias Skin: no skin lesions, no pruritus, + bilateral arthritic hip pain Neuro: no weakness, no loss of consciousness, no syncope Psych: no anxiety, no depression, no decrease appetite Heme/Lymph: no bruising, no bleeding  ED Course: Discussed with emergency medicine provider, patient requiring hospitalization for chief concerns of hyperglycemia monitoring in setting of patient being administered incorrect medications.  Assessment/Plan  Principal Problem:   Hypoglycemia Active Problems:   Essential hypertension   Development delay   Chronic tophaceous gout   Hypothyroidism   Schizoaffective disorder, depressive type (HCC)   Tardive dyskinesia   Drug-induced parkinsonism (HCC)   Anxiety disorder   Lymphedema   Assessment and Plan:  * Hypoglycemia - Continue D10 infusion at 75 mL/h, 16 hours ordered - CBG monitoring, every 2 hours - Admit to telemetry medical, observation  Anxiety disorder - Citalopram 40 mg daily resumed - Ativan 0.5 mg p.o. every 6 hours as needed for anxiety, 18 hours ordered  Schizoaffective disorder, depressive type ([place]) - Resumed home risperidone per home dosing 1.5 mg p.o. in  the morning and 1 mg at night - Valbenazine 80 mg  nightly  Hypothyroidism - Levothyroxine 25 mcg daily resumed  Essential hypertension - Hydralazine 5 mg every 8 hours as needed for SBP>180, 4 days ordered  Chart reviewed.   DVT prophylaxis: Enoxaparin 40 mg subcutaneous Code Status: Full code Diet: Regular diet Family Communication: No Disposition Plan: Pending clinical course Consults called: None at this time Admission status: Telemetry medical, observation  Past Medical History:  Diagnosis Date   Allergy    Essential hypertension    Hypothyroidism    Tardive dyskinesia    Past Surgical History:  Procedure Laterality Date   ANKLE SURGERY     COLON SURGERY     Father had surgery, not the patient   COLONOSCOPY WITH PROPOFOL N/A 01/07/2023   Procedure: COLONOSCOPY WITH PROPOFOL;  Surgeon: Lesly Rubenstein, MD;  Location: [facility] ENDOSCOPY;  Service: Endoscopy;  Laterality: N/A;  1st Case   HERNIA REPAIR     INTRAMEDULLARY (IM) NAIL INTERTROCHANTERIC Right 06/18/2019   Procedure: INTRAMEDULLARY (IM) NAIL INTERTROCHANTRIC;  Surgeon: Lovell Sheehan, MD;  Location: [facility] ORS;  Service: Orthopedics;  Laterality: Right;   INTRAMEDULLARY (IM) NAIL INTERTROCHANTERIC Left 02/18/2022   Procedure: INTRAMEDULLARY (IM) NAIL INTERTROCHANTRIC;  Surgeon: Lovell Sheehan, MD;  Location: [facility] ORS;  Service: Orthopedics;  Laterality: Left;   TONSILLECTOMY     Social History:  reports that he has never smoked. He has never used smokeless tobacco. He reports that he does not drink alcohol and does not use drugs.  Allergies  Allergen Reactions   Prednisone Other (See Comments)    Did not like the way it made him feel  Did not like the way it made him feel    Lamotrigine Rash   Family History  Problem Relation Age of Onset   Diabetes Mother    Stroke Mother    Cancer Father        colon   Colon cancer Father    Colon cancer Other    Mental illness Neg Hx    Family history: Family history reviewed and not pertinent  Prior to  Admission medications   Medication Sig Start Date End Date Taking? Authorizing Provider  allopurinol (ZYLOPRIM) 100 MG tablet TAKE 1 TABLET(100 MG) BY MOUTH DAILY 06/08/21   Karamalegos, Devonne Doughty, DO  citalopram (CELEXA) 40 MG tablet TAKE 1 TABLET(40 MG) BY MOUTH DAILY 06/08/21   Ursula Alert, MD  enoxaparin (LOVENOX) 40 MG/0.4ML injection Inject 0.4 mLs (40 mg total) into the skin daily for 14 days. 02/23/22 03/09/22  Sharen Hones, MD  fexofenadine (ALLEGRA) 180 MG tablet Take 180 mg by mouth.    [provider]  ibuprofen (ADVIL) 200 MG tablet Take 200 mg by mouth daily after supper.    [provider]  levothyroxine (SYNTHROID) 25 MCG tablet TAKE 1 TABLET BY MOUTH DAILY BEFORE BREAKFAST ON AN EMPTY STOMACH( NOT TO TAKE WITH OTHER MEDICINES) 06/08/21   Olin Hauser, DO  ondansetron (ZOFRAN) 4 MG tablet Take by mouth. 12/30/21   [provider]  propranolol (INDERAL) 10 MG tablet Take 5 mg by mouth 2 (two) times daily. For severe anxiety/agitation 10/27/21   Olin Hauser, DO  QUEtiapine (SEROQUEL) 50 MG tablet Take 50 mg by mouth 3 (three) times daily. Patient not taking: Reported on 01/07/2023    [provider]  risperiDONE (RISPERDAL) 0.5 MG tablet Take 0.5 mg by mouth 2 (two) times daily.    [provider]  thiamine (VITAMIN B-1) 100 MG tablet Take 100 mg by mouth daily.    [provider]  traZODone (DESYREL) 50 MG tablet Take 1 tablet (50 mg total) by mouth at bedtime. Dose change 01/05/22   Ursula Alert, MD  valbenazine [facility]) 80 MG capsule Take 1 capsule (80 mg total) by mouth at bedtime. 01/05/22   Ursula Alert, MD   Physical Exam: Vitals:   02/05/23 1215 02/05/23 1330 02/05/23 1345 02/05/23 1415  BP: 132/80  138/77 (!) 127/94  Pulse: (!) 58  74 83  Resp: 12 13  16  $ Temp:    (!) 97.4 F (36.3 C)  SpO2: 100%  100% 100%  Weight:      Height:       Constitutional: appears older than chronological  age, NAD, calm, comfortable Eyes: PERRL, lids and conjunctivae normal ENMT: Mucous membranes are moist. Posterior pharynx clear of any exudate or lesions. Age-appropriate dentition. Hearing appropriate Neck: normal, supple, no masses, no thyromegaly Respiratory: clear to auscultation bilaterally, no wheezing, no crackles. Normal respiratory effort. No accessory muscle use.  Cardiovascular: Regular rate and rhythm, no murmurs / rubs / gallops. No extremity edema. 2+ pedal pulses. No carotid bruits.  Abdomen: no tenderness, no masses palpated, no hepatosplenomegaly. Bowel sounds positive.  Musculoskeletal: no clubbing / cyanosis. No joint deformity upper and lower extremities. Good ROM, no contractures, no atrophy. Normal muscle tone.  Skin: no rashes, lesions, ulcers. No induration Neurologic: Sensation intact. Strength 5/5 in all 4.  Psychiatric: Normal judgment and insight. Alert and oriented x 3. Normal mood.   EKG: independently reviewed, showing sinus rhythm with rate of 56, QTc 470 Chest x-ray on Admission: Not indicated at this time Labs on Admission: I have personally reviewed following labs  CBC: Recent Labs  Lab 02/05/23 0912  WBC 2.5*  NEUTROABS 1.4*  HGB 12.8*  HCT 37.2*  MCV 89.2  PLT 91*   Basic Metabolic Panel: Recent Labs  Lab 02/05/23 0912  NA 142  K 3.4*  CL 111  CO2 22  GLUCOSE 104*  BUN 28*  CREATININE 0.96  CALCIUM 8.6*   GFR: Estimated Creatinine Clearance: 71.1 mL/min (by C-G formula based on SCr of 0.96 mg/dL).  Liver Function Tests: Recent Labs  Lab 02/05/23 0912  AST 30  ALT 13  ALKPHOS 56  BILITOT 0.7  PROT 5.8*  ALBUMIN 3.8   CBG: Recent Labs  Lab 02/05/23 1116 02/05/23 1400  GLUCAP 73 66*   Urine analysis:    Component Value Date/Time   COLORURINE YELLOW (A) 02/17/2022 1230   APPEARANCEUR CLEAR (A) 02/17/2022 1230   LABSPEC 1.012 02/17/2022 1230   PHURINE 6.0 02/17/2022 [place] 02/17/2022 1230   HGBUR  NEGATIVE 02/17/2022 1230   BILIRUBINUR NEGATIVE 02/17/2022 1230   KETONESUR NEGATIVE 02/17/2022 1230   PROTEINUR NEGATIVE 02/17/2022 1230   NITRITE NEGATIVE 02/17/2022 1230   LEUKOCYTESUR NEGATIVE 02/17/2022 1230   This document was prepared using Dragon Voice Recognition software and may include unintentional dictation errors.  Dr. Tobie Poet [facility]  If 7PM-7AM, please contact overnight-coverage provider If 7AM-7PM, please contact day coverage provider [email or web address]  02/05/2023, 4:00 PM

## 2023-02-05 NOTE — ED Notes (Signed)
Lunch delivered. Patient eating while sitting up in bed. Patient is showing no signs of distress.

## 2023-02-05 NOTE — Assessment & Plan Note (Signed)
-   Levothyroxine 25 mcg daily resumed 

## 2023-02-05 NOTE — ED Triage Notes (Signed)
Patient present to the ER from [place] after an accidental drug overdose. Per EMS patient was given his roommates medications which included blood pressure, cholesterol and diabetes medications. Patient awake, alert and answering questions appropriately.

## 2023-02-05 NOTE — Assessment & Plan Note (Addendum)
-   Citalopram 40 mg daily resumed - Ativan 0.5 mg p.o. every 6 hours as needed for anxiety, 18 hours ordered

## 2023-02-05 NOTE — Assessment & Plan Note (Signed)
-   Resumed home risperidone per home dosing 1.5 mg p.o. in the morning and 1 mg at night - Valbenazine 80 mg nightly

## 2023-02-05 NOTE — ED Provider Notes (Signed)
[facility] Provider Note    Event Date/Time   First MD Initiated Contact with Patient 02/05/23 [phone]     (approximate)   History   Chief Complaint: Drug Overdose   HPI  Andres [name] is a 57 y.o. male with a history of hypertension hypothyroidism schizophrenia tardive dyskinesia who is sent to the ED from [facility] assisted living this morning due to accidentally being given his roommates medication.  This includes amlodipine 10 mg, losartan 100 mg, hydralazine 25 mg, HCTZ 12.5 mg, Jentadueto, glipizide 5 mg.    Was also given a dose of Prozac and Risperdal.  The patient does normally take Celexa and Seroquel.  Patient denies any complaints at this time.     Physical Exam   Triage Vital Signs: ED Triage Vitals  Enc Vitals Group     BP 02/05/23 0902 (!) 123/97     Pulse Rate 02/05/23 0902 (!) 59     Resp 02/05/23 0902 (!) 22     Temp --      Temp src --      SpO2 02/05/23 0902 100 %     Weight 02/05/23 0903 132 lb (59.9 kg)     Height 02/05/23 0903 5' 6"$  (1.676 m)     Head Circumference --      Peak Flow --      Pain Score 02/05/23 0904 0     Pain Loc --      Pain Edu? --      Excl. in [place]? --     Most recent vital signs: Vitals:   02/05/23 1345 02/05/23 1415  BP: 138/77 (!) 127/94  Pulse: 74 83  Resp:  16  SpO2: 100% 100%    General: Awake, no distress.  CV:  Good peripheral perfusion. Rate and rhythm. Resp:  Normal effort.  Clear to auscultation Abd:  No distention.  Soft nontender Other:  Normal mental status   ED Results / Procedures / Treatments   Labs (all labs ordered are listed, but only abnormal results are displayed) Labs Reviewed  ACETAMINOPHEN LEVEL - Abnormal; Notable for the following components:      Result Value   Acetaminophen (Tylenol), Serum <10 (*)    All other components within normal limits  COMPREHENSIVE METABOLIC PANEL - Abnormal; Notable for the following components:   Potassium 3.4 (*)     Glucose, Bld 104 (*)    BUN 28 (*)    Calcium 8.6 (*)    Total Protein 5.8 (*)    All other components within normal limits  SALICYLATE LEVEL - Abnormal; Notable for the following components:   Salicylate Lvl [id number] (*)    All other components within normal limits  CBC WITH DIFFERENTIAL/PLATELET - Abnormal; Notable for the following components:   WBC 2.5 (*)    RBC 4.17 (*)    Hemoglobin 12.8 (*)    HCT 37.2 (*)    Platelets 91 (*)    Neutro Abs 1.4 (*)    All other components within normal limits  CBG MONITORING, ED - Abnormal; Notable for the following components:   Glucose-Capillary 66 (*)    All other components within normal limits  ETHANOL  CBG MONITORING, ED     EKG Interpreted by me Sinus rhythm rate of 56.  Normal axis, normal intervals.  Poor R wave progression.  Normal ST segments and T waves.  No evidence of cardiac toxicity.   RADIOLOGY    PROCEDURES:  Procedures  MEDICATIONS ORDERED IN ED: Medications  dextrose 10 % infusion (has no administration in time range)  sodium chloride 0.9 % bolus 250 mL (0 mLs Intravenous Stopped 02/05/23 1216)     IMPRESSION / MDM / ASSESSMENT AND PLAN / ED COURSE  I reviewed the triage vital signs and the nursing notes.  DDx: Accidental medication overdose, hypoglycemia, hypotension, dehydration  Patient's presentation is most consistent with acute presentation with potential threat to life or bodily function.  Patient sent to the ED today due to accidental medication overdose.  He was given history [place] medications which includes 4 antihypertensives and 3 diabetes medicines.  Will need to monitor the patient's blood pressure, blood sugar, mental status over the course of several hours.  Will give fluids to maintain hydration, check EKG and labs.  ----------------------------------------- 2:35 PM on 02/05/2023 ----------------------------------------- Despite pt eating frequently, blood sugar is downtrending from 100  to 75 to 65. Will start D10 infusion, will need to admit to maintain euglycemia. Case d/w hospitalist.        FINAL CLINICAL IMPRESSION(S) / ED DIAGNOSES   Final diagnoses:  Medication overdose, accidental or unintentional, initial encounter  Hypoglycemia     Rx / DC Orders   ED Discharge Orders     None        Note:  This document was prepared using Dragon voice recognition software and may include unintentional dictation errors.   Carrie Mew, MD 02/05/23 1435

## 2023-02-06 DIAGNOSIS — E162 Hypoglycemia, unspecified: Secondary | ICD-10-CM | POA: Diagnosis not present

## 2023-02-06 LAB — BASIC METABOLIC PANEL
Anion gap: 7 (ref 5–15)
BUN: 24 mg/dL — ABNORMAL HIGH (ref 6–20)
CO2: 21 mmol/L — ABNORMAL LOW (ref 22–32)
Calcium: 9 mg/dL (ref 8.9–10.3)
Chloride: 112 mmol/L — ABNORMAL HIGH (ref 98–111)
Creatinine, Ser: 0.87 mg/dL (ref 0.61–1.24)
GFR, Estimated: >60 mL/min (ref >60)
Glucose, Bld: 83 mg/dL (ref 70–99)
Potassium: 3.5 mmol/L (ref 3.5–5.1)
Sodium: 140 mmol/L (ref 135–145)

## 2023-02-06 LAB — CBG MONITORING, ED
Glucose-Capillary: 71 mg/dL (ref 70–99)
Glucose-Capillary: 82 mg/dL (ref 70–99)
Glucose-Capillary: 85 mg/dL (ref 70–99)
Glucose-Capillary: 96 mg/dL (ref 70–99)
Glucose-Capillary: 116 mg/dL — ABNORMAL HIGH (ref 70–99)

## 2023-02-06 LAB — CBC
HCT: 38.2 % — ABNORMAL LOW (ref 39.0–52.0)
Hemoglobin: 12.8 g/dL — ABNORMAL LOW (ref 13.0–17.0)
MCH: 30.4 pg (ref 26.0–34.0)
MCHC: 33.5 g/dL (ref 30.0–36.0)
MCV: 90.7 fL (ref 80.0–100.0)
Platelets: 107 10*3/uL — ABNORMAL LOW (ref 150–400)
RBC: 4.21 MIL/uL — ABNORMAL LOW (ref 4.22–5.81)
RDW: 13.9 % (ref 11.5–15.5)
WBC: 3.7 10*3/uL — ABNORMAL LOW (ref 4.0–10.5)
nRBC: 0 % (ref 0.0–0.2)

## 2023-02-06 NOTE — ED Notes (Signed)
Advised nurse that patient bed just got rejected

## 2023-02-06 NOTE — ED Notes (Signed)
Spoke with Dr Pietro Cassis on phone. Plan is to stop D10 infusion now, continue checking CBG q2hr until 2pm, and if SBG stable at 2pm pt will discharge. If CBG drops, will restart D10 and admit.

## 2023-02-06 NOTE — ED Notes (Signed)
Advised nurse that patient has a bed its beening clean

## 2023-02-06 NOTE — ED Notes (Signed)
Messaged pharmacy to send next bag of D10.

## 2023-02-06 NOTE — ED Notes (Signed)
Pt ate breakfast.

## 2023-02-06 NOTE — ED Notes (Signed)
Pharmacy has not sent D10 bag. Will hang 22m bag once other bag runs out.

## 2023-02-06 NOTE — Discharge Summary (Signed)
Physician Discharge Summary  [name] [id number] DOB: [birth year] DOA: 02/05/2023  PCP: Verl Blalock, NP  Admit date: 02/05/2023 Discharge date: 02/06/2023  Admitted From: Group home Discharge disposition: Discharge back to group home  Recommendations at discharge:  Continue previous medicines  Brief narrative: Andres [name] is a 57 y.o. male with PMH significant for essential hypertension, hypothyroidism, tardive dyskinesia, intellectual disability, episodic mood disorder, drug-induced parkinsonism who lives at [place] group home. 2/10, patient was sent to the ED after he was accidentally given his roommates medications which include losartan, hydralazine, linagliptin, metformin, glipizide, Prozac and Risperdal.  In the ED, patient was afebrile, hemodynamically stable Labs with potassium low at 3.4 Blood glucose level was low in 60s after which patient was started on D10 drip. Admitted to [facility]  Subjective: Patient was seen and examined this morning.  Pleasant middle-aged male.  Not in distress.  Lying on bed. Overnight, most bradycardic to 50s, blood pressure normal, breathing on room air Patient was maintained on dextrose drip overnight. Last set of labs this morning with sodium 140, glucose 83  Hospital course: HypOglycemia Due to accidental administration of antidiabetic meds at group home which happened in the morning of 2/10, more than 24 hours ago. Patient is not a diabetic Blood sugar level was low in 60s after his dextrose drip was started As of this morning, patient was on dextrose drip.  Blood sugar trend as below.  I believe his pancreas is able to make enough insulin to handle the dextrose drip at current rate and hence we are not seeing a blood sugar surge. Dextrose drip was stopped this morning.  He was monitored for next several hours.  Blood sugar level is stable.  No new symptoms. Able to eat normal.  Okay to discharge back to group home  today. Recent Labs  Lab 02/06/23 0224 02/06/23 0451 02/06/23 0749 02/06/23 0953 02/06/23 1227  GLUCAP 71 82 85 96 116*   Anxiety disorder Citalopram 40 mg daily resumed Ativan 0.5 mg p.o. every 6 hours as needed for anxiety   Schizoaffective disorder, depressive type Resumed home risperidone per home dosing 1.5 mg p.o. in the morning and 1 mg at night Valbenazine 80 mg nightly   Hypothyroidism  Levothyroxine 25 mcg daily resumed  Wounds:  -    Discharge Exam:   Vitals:   02/06/23 0730 02/06/23 0815 02/06/23 1100 02/06/23 1215  BP: (!) 156/80 131/79  125/82  Pulse: (!) 54 (!) 54  64  Resp: (!) 23 15  (!) 28  Temp:   98.4 F (36.9 C)   TempSrc:      SpO2: 100% 100%  100%  Weight:      Height:        Body mass index is 21.31 kg/m.  General exam: Pleasant, not in distress Skin: No rashes, lesions or ulcers. HEENT: Atraumatic, normocephalic, no obvious bleeding Lungs: Clear to auscultation bilaterally CVS: Regular rate and rhythm, no murmur GI/Abd soft, nontender, nondistended, bowel sound present CNS: Alert, awake, baseline psychiatric issues.  Not restless or agitated Extremities: No pedal edema, no calf tenderness  Follow ups:    Follow-up Information     Verl Blalock, NP Follow up.   Specialty: Adult Health Nurse Practitioner Contact information: [place] [address] [phone]                 Discharge Instructions:   Discharge Instructions     Call MD for:  difficulty breathing, headache or visual disturbances   Complete by: As directed    Call MD for:  extreme fatigue   Complete by: As directed    Call MD for:  hives   Complete by: As directed    Call MD for:  persistant dizziness or light-headedness   Complete by: As directed    Call MD for:  persistant nausea and vomiting   Complete by: As directed    Call MD for:  severe uncontrolled pain   Complete by: As directed    Call MD for:   temperature >100.4   Complete by: As directed    Diet general   Complete by: As directed    Discharge instructions   Complete by: As directed    General discharge instructions: Follow with Primary MD Verl Blalock, NP in 7 days  Please request your PCP  to go over your hospital tests, procedures, radiology results at the follow up. Please get your medicines reviewed and adjusted.  Your PCP may decide to repeat certain labs or tests as needed. Do not drive, operate heavy machinery, perform activities at heights, swimming or participation in water activities or provide baby sitting services if your were admitted for syncope or siezures until you have seen by Primary MD or a Neurologist and advised to do so again. [place] Controlled Substance Reporting System database was reviewed. Do not drive, operate heavy machinery, perform activities at heights, swim, participate in water activities or provide baby-sitting services while on medications for pain, sleep and mood until your outpatient physician has reevaluated you and advised to do so again.  You are strongly recommended to comply with the dose, frequency and duration of prescribed medications. Activity: As tolerated with Full fall precautions use walker/cane & assistance as needed Avoid using any recreational substances like cigarette, tobacco, alcohol, or non-prescribed drug. If you experience worsening of your admission symptoms, develop shortness of breath, life threatening emergency, suicidal or homicidal thoughts you must seek medical attention immediately by calling 911 or calling your MD immediately  if symptoms less severe. You must read complete instructions/literature along with all the possible adverse reactions/side effects for all the medicines you take and that have been prescribed to you. Take any new medicine only after you have completely understood and accepted all the possible adverse reactions/side effects.  Wear Seat  belts while driving. You were cared for by a hospitalist during your hospital stay. If you have any questions about your discharge medications or the care you received while you were in the hospital after you are discharged, you can call the unit and ask to speak with the hospitalist or the covering physician. Once you are discharged, your primary care physician will handle any further medical issues. Please note that NO REFILLS for any discharge medications will be authorized once you are discharged, as it is imperative that you return to your primary care physician (or establish a relationship with a primary care physician if you do not have one).   Increase activity slowly   Complete by: As directed        Discharge Medications:   Allergies as of 02/06/2023       Reactions   Prednisone Other (See Comments)   Did not like the way it made him feel  Did not like the way it made him feel    Lamotrigine Rash        Medication List     TAKE these medications  allopurinol 100 MG tablet Commonly known as: ZYLOPRIM TAKE 1 TABLET(100 MG) BY MOUTH DAILY   benzonatate 200 MG capsule Commonly known as: TESSALON Take 200 mg by mouth 3 (three) times daily as needed for cough.   citalopram 40 MG tablet Commonly known as: CELEXA TAKE 1 TABLET(40 MG) BY MOUTH DAILY   docusate sodium 100 MG capsule Commonly known as: COLACE Take 100 mg by mouth daily.   fexofenadine 180 MG tablet Commonly known as: ALLEGRA Take 180 mg by mouth.   ibuprofen 200 MG tablet Commonly known as: ADVIL Take 200 mg by mouth 2 (two) times daily with a meal.   levothyroxine 25 MCG tablet Commonly known as: SYNTHROID TAKE 1 TABLET BY MOUTH DAILY BEFORE BREAKFAST ON AN EMPTY STOMACH( NOT TO TAKE WITH OTHER MEDICINES)   melatonin 5 MG Tabs Take 5 mg by mouth at bedtime.   multivitamin with minerals Tabs tablet Take 1 tablet by mouth daily.   omeprazole 20 MG capsule Commonly known as: PRILOSEC Take  20 mg by mouth daily.   risperiDONE 1 MG tablet Commonly known as: RISPERDAL Take 1-1.5 mg by mouth See admin instructions. Take 1 tablets (1.104m) by mouth every morning and take 1 tablet (128m by mouth every night at bedtime   thiamine 100 MG tablet Commonly known as: Vitamin B-1 Take 100 mg by mouth daily.   traZODone 50 MG tablet Commonly known as: DESYREL Take 1 tablet (50 mg total) by mouth at bedtime. Dose change What changed: how much to take   valbenazine 80 MG capsule Commonly known as: Ingrezza Take 1 capsule (80 mg total) by mouth at bedtime.   Vitamin D 50 MCG (2000 UT) tablet Take 2,000 Units by mouth daily.         The results of significant diagnostics from this hospitalization (including imaging, microbiology, ancillary and laboratory) are listed below for reference.    Procedures and Diagnostic Studies:   No results found.   Labs:   Basic Metabolic Panel: Recent Labs  Lab 02/05/23 0912 02/05/23 1704 02/05/23 2323 02/06/23 0448  NA 142  --   --  140  K 3.4*  --   --  3.5  CL 111  --   --  112*  CO2 22  --   --  21*  GLUCOSE 104*  --  68* 83  BUN 28*  --   --  24*  CREATININE 0.96  --   --  0.87  CALCIUM 8.6*  --   --  9.0  MG  --  1.9  --   --   PHOS  --  2.5  --   --    GFR Estimated Creatinine Clearance: 78.4 mL/min (by C-G formula based on SCr of 0.87 mg/dL). Liver Function Tests: Recent Labs  Lab 02/05/23 0912  AST 30  ALT 13  ALKPHOS 56  BILITOT 0.7  PROT 5.8*  ALBUMIN 3.8   No results for input(s): "LIPASE", "AMYLASE" in the last 168 hours. No results for input(s): "AMMONIA" in the last 168 hours. Coagulation profile No results for input(s): "INR", "PROTIME" in the last 168 hours.  CBC: Recent Labs  Lab 02/05/23 0912 02/06/23 0448  WBC 2.5* 3.7*  NEUTROABS 1.4*  --   HGB 12.8* 12.8*  HCT 37.2* 38.2*  MCV 89.2 90.7  PLT 91* 107*   Cardiac Enzymes: No results for input(s): "CKTOTAL", "CKMB", "CKMBINDEX",  "TROPONINI" in the last 168 hours. BNP: Invalid input(s): "POCBNP" CBG: Recent Labs  Lab 02/06/23 0224 02/06/23 0451 02/06/23 0749 02/06/23 0953 02/06/23 1227  GLUCAP 71 82 85 96 116*   D-Dimer No results for input(s): "DDIMER" in the last 72 hours. Hgb A1c No results for input(s): "HGBA1C" in the last 72 hours. Lipid Profile No results for input(s): "CHOL", "HDL", "LDLCALC", "TRIG", "CHOLHDL", "LDLDIRECT" in the last 72 hours. Thyroid function studies No results for input(s): "TSH", "T4TOTAL", "T3FREE", "THYROIDAB" in the last 72 hours.  Invalid input(s): "FREET3" Anemia work up No results for input(s): "VITAMINB12", "FOLATE", "FERRITIN", "TIBC", "IRON", "RETICCTPCT" in the last 72 hours. Microbiology No results found for this or any previous visit (from the past 240 hour(s)).  Time coordinating discharge: 35 minutes  Signed: Revanth Neidig  [facility] 02/06/2023, 1:42 PM  .

## 2023-02-15 ENCOUNTER — Ambulatory Visit
Admission: RE | Admit: 2023-02-15 | Discharge: 2023-02-15 | Disposition: A | Discharge status: Home / Self Care | Payer: Medicare Other | Source: Ambulatory Visit | Attending: Gastroenterology | Admitting: Gastroenterology

## 2023-02-15 DIAGNOSIS — R634 Abnormal weight loss: Secondary | ICD-10-CM

## 2023-11-17 ENCOUNTER — Ambulatory Visit (INDEPENDENT_AMBULATORY_CARE_PROVIDER_SITE_OTHER): Payer: Medicare Other | Admitting: Vascular Surgery

## 2024-01-03 NOTE — Progress Notes (Deleted)
 MRN : [id number]  Andres [name] is a 58 y.o. (July 27, 1966) male who presents with chief complaint of legs swell.  History of Present Illness:   The patient returns to the office for followup evaluation regarding leg swelling. He has drug induced parkinson's and fairly severe tardive dyskinesia and is currently living in a group home.  At the time of his last visit in May he was under going several changes and was having difficulty with daily compression and using the pump so his swelling had become less well controlled.  Now with the extra help he has in the group home is is much better.  The swelling has persisted but with the lymph pump is under much, much better controlled. The pain associated with swelling is decreased. There have not been any interval development of a ulcerations or wounds.   The patient denies problems with the pump, noting it is working well and the leggings are in good condition.   Since the previous visit the patient has been wearing graduated compression stockings and using the lymph pump on a routine basis and  has noted significant improvement in the lymphedema.    Patient stated the lymph pump has been helpful with the treatment of the lymphedema.   No outpatient medications have been marked as taking for the 01/05/24 encounter (Appointment) with Jama, Cordella MATSU, MD.    Past Medical History:  Diagnosis Date   Allergy    Essential hypertension    Hypothyroidism    Tardive dyskinesia     Past Surgical History:  Procedure Laterality Date   ANKLE SURGERY     COLON SURGERY     Father had surgery, not the patient   COLONOSCOPY WITH PROPOFOL  N/A 01/07/2023   Procedure: COLONOSCOPY WITH PROPOFOL ;  Surgeon: Maryruth Ole DASEN, MD;  Location: [facility];  Service: Endoscopy;  Laterality: N/A;  1st Case   HERNIA REPAIR     INTRAMEDULLARY (IM) NAIL INTERTROCHANTERIC Right 06/18/2019   Procedure: INTRAMEDULLARY (IM) NAIL  INTERTROCHANTRIC;  Surgeon: Leora Lynwood SAUNDERS, MD;  Location: [facility] OR[facility];  Service: Orthopedics;  Laterality: Right;   INTRAMEDULLARY (IM) NAIL INTERTROCHANTERIC Left 02/18/2022   Procedure: INTRAMEDULLARY (IM) NAIL INTERTROCHANTRIC;  Surgeon: Leora Lynwood SAUNDERS, MD;  Location: [facility] OR[facility];  Service: Orthopedics;  Laterality: Left;   TONSILLECTOMY      Social History Social History   Tobacco Use   Smoking status: Never   Smokeless tobacco: Never  Vaping Use   Vaping status: Never Used  Substance Use Topics   Alcohol use: No   Drug use: No    Family History Family History  Problem Relation Age of Onset   Diabetes Mother    Stroke Mother    Cancer Father        colon   Colon cancer Father    Colon cancer Other    Mental illness Neg Hx     Allergies  Allergen Reactions   Prednisone  Other (See Comments)    Did not like the way it made him feel  Did not like the way it made him feel    Lamotrigine  Rash     REVIEW OF SYSTEMS (Negative unless checked)  Constitutional: [] Weight loss  [] Fever  [] Chills Cardiac: [] Chest pain   [] Chest pressure   [] Palpitations   [] Shortness of breath when laying flat   []   Shortness of breath with exertion. Vascular:  [] Pain in legs with walking   [x] Pain in legs with standing  [] History of DVT   [] Phlebitis   [x] Swelling in legs   [] Varicose veins   [] Non-healing ulcers Pulmonary:   [] Uses home oxygen   [] Productive cough   [] Hemoptysis   [] Wheeze  [] COPD   [] Asthma Neurologic:  [] Dizziness   [] Seizures   [] History of stroke   [] History of TIA  [] Aphasia   [] Vissual changes   [] Weakness or numbness in arm   [] Weakness or numbness in leg Musculoskeletal:   [] Joint swelling   [] Joint pain   [] Low back pain Hematologic:  [] Easy bruising  [] Easy bleeding   [] Hypercoagulable state   [] Anemic Gastrointestinal:  [] Diarrhea   [] Vomiting  [] Gastroesophageal reflux/heartburn   [] Difficulty swallowing. Genitourinary:  [] Chronic kidney disease   [] Difficult  urination  [] Frequent urination   [] Blood in urine Skin:  [] Rashes   [] Ulcers  Psychological:  [] History of anxiety   []  History of major depression.  Physical Examination  There were no vitals filed for this visit. There is no height or weight on file to calculate BMI. Gen: WD/WN, NAD Head: [place]/AT, No temporalis wasting.  Ear/Nose/Throat: Hearing grossly intact, nares w/o erythema or drainage, pinna without lesions Eyes: PER, EOMI, sclera nonicteric.  Neck: Supple, no gross masses.  No JVD.  Pulmonary:  Good air movement, no audible wheezing, no use of accessory muscles.  Cardiac: RRR, precordium not hyperdynamic. Vascular:  scattered varicosities present bilaterally.  Mild venous stasis changes to the legs bilaterally.  3-4+ soft pitting edema, CEAP [id number]  Vessel Right Left  Radial Palpable Palpable  Gastrointestinal: soft, non-distended. No guarding/no peritoneal signs.  Musculoskeletal: M/S 5/5 throughout.  No deformity.  Neurologic: CN 2-12 intact. Pain and light touch intact in extremities.  Symmetrical.  Speech is fluent. Motor exam as listed above. Psychiatric: Judgment intact, Mood & affect appropriate for pt's clinical situation. Dermatologic: Venous rashes no ulcers noted.  No changes consistent with cellulitis. Lymph : No lichenification or skin changes of chronic lymphedema.  CBC Lab Results  Component Value Date   WBC 3.7 (L) 02/06/2023   HGB 12.8 (L) 02/06/2023   HCT 38.2 (L) 02/06/2023   MCV 90.7 02/06/2023   PLT 107 (L) 02/06/2023    BMET    Component Value Date/Time   NA 140 02/06/2023 0448   K 3.5 02/06/2023 0448   CL 112 (H) 02/06/2023 0448   CO2 21 (L) 02/06/2023 0448   GLUCOSE 83 02/06/2023 0448   BUN 24 (H) 02/06/2023 0448   CREATININE 0.87 02/06/2023 0448   CREATININE 1.03 10/08/2020 0820   CALCIUM 9.0 02/06/2023 0448   GFRNONAA >60 02/06/2023 0448   GFRNONAA 81 10/08/2020 0820   GFRAA 94 10/08/2020 0820   CrCl cannot be calculated  (Patient's most recent lab result is older than the maximum 21 days allowed.).  COAG Lab Results  Component Value Date   INR 1.0 02/17/2022   INR 0.9 06/17/2019    Radiology No results found.   Assessment/Plan There are no diagnoses linked to this encounter.   Cordella Shawl, MD  01/03/2024 8:14 AM

## 2024-01-05 ENCOUNTER — Ambulatory Visit (INDEPENDENT_AMBULATORY_CARE_PROVIDER_SITE_OTHER): Payer: Medicare Other | Admitting: Vascular Surgery

## 2024-01-05 DIAGNOSIS — I10 Essential (primary) hypertension: Secondary | ICD-10-CM

## 2024-01-05 DIAGNOSIS — I890 Lymphedema, not elsewhere classified: Secondary | ICD-10-CM

## 2024-01-05 DIAGNOSIS — E039 Hypothyroidism, unspecified: Secondary | ICD-10-CM

## 2024-01-05 DIAGNOSIS — I872 Venous insufficiency (chronic) (peripheral): Secondary | ICD-10-CM

## 2024-01-07 NOTE — Progress Notes (Signed)
 MRN : [id number]  Andres [name] is a 57 y.o. ([birth year]) male who presents with chief complaint of legs swell.  History of Present Illness:   The patient returns to the office for followup evaluation regarding leg swelling. He has drug induced parkinson's and fairly severe tardive dyskinesia and is currently living in a group home.  At the time of his last visit in May he was under going several changes and was having difficulty with daily compression and using the pump so his swelling had become less well controlled.  Now with the extra help he has in the group home is is much better.  The swelling has persisted but with the lymph pump is under much, much better controlled. The pain associated with swelling is decreased. There have not been any interval development of a ulcerations or wounds.   The patient denies problems with the pump, noting it is working well and the leggings are in good condition.   Since the previous visit the patient has been wearing graduated compression stockings and using the lymph pump on a routine basis and  has noted significant improvement in the lymphedema.    Patient stated the lymph pump has been helpful with the treatment of the lymphedema.   No outpatient medications have been marked as taking for the 01/09/24 encounter (Appointment) with Jama, Cordella MATSU, MD.    Past Medical History:  Diagnosis Date   Allergy    Essential hypertension    Hypothyroidism    Tardive dyskinesia     Past Surgical History:  Procedure Laterality Date   ANKLE SURGERY     COLON SURGERY     Father had surgery, not the patient   COLONOSCOPY WITH PROPOFOL  N/A 01/07/2023   Procedure: COLONOSCOPY WITH PROPOFOL ;  Surgeon: Maryruth Ole DASEN, MD;  Location: [facility];  Service: Endoscopy;  Laterality: N/A;  1st Case   HERNIA REPAIR     INTRAMEDULLARY (IM) NAIL INTERTROCHANTERIC Right 06/18/2019   Procedure: INTRAMEDULLARY (IM) NAIL  INTERTROCHANTRIC;  Surgeon: Leora Lynwood SAUNDERS, MD;  Location: [facility];  Service: Orthopedics;  Laterality: Right;   INTRAMEDULLARY (IM) NAIL INTERTROCHANTERIC Left 02/18/2022   Procedure: INTRAMEDULLARY (IM) NAIL INTERTROCHANTRIC;  Surgeon: Leora Lynwood SAUNDERS, MD;  Location: [facility];  Service: Orthopedics;  Laterality: Left;   TONSILLECTOMY      Social History Social History   Tobacco Use   Smoking status: Never   Smokeless tobacco: Never  Vaping Use   Vaping status: Never Used  Substance Use Topics   Alcohol use: No   Drug use: No    Family History Family History  Problem Relation Age of Onset   Diabetes Mother    Stroke Mother    Cancer Father        colon   Colon cancer Father    Colon cancer Other    Mental illness Neg Hx     Allergies  Allergen Reactions   Prednisone  Other (See Comments)    Did not like the way it made him feel  Did not like the way it made him feel    Lamotrigine  Rash     REVIEW OF SYSTEMS (Negative unless checked)  Constitutional: [] Weight loss  [] Fever  [] Chills Cardiac: [] Chest pain   [] Chest pressure   [] Palpitations   [] Shortness of breath when laying flat   []   Shortness of breath with exertion. Vascular:  [] Pain in legs with walking   [x] Pain in legs with standing  [] History of DVT   [] Phlebitis   [x] Swelling in legs   [] Varicose veins   [] Non-healing ulcers Pulmonary:   [] Uses home oxygen   [] Productive cough   [] Hemoptysis   [] Wheeze  [] COPD   [] Asthma Neurologic:  [] Dizziness   [] Seizures   [] History of stroke   [] History of TIA  [] Aphasia   [] Vissual changes   [] Weakness or numbness in arm   [] Weakness or numbness in leg Musculoskeletal:   [] Joint swelling   [x] Joint pain   [] Low back pain Hematologic:  [] Easy bruising  [] Easy bleeding   [] Hypercoagulable state   [] Anemic Gastrointestinal:  [] Diarrhea   [] Vomiting  [] Gastroesophageal reflux/heartburn   [] Difficulty swallowing. Genitourinary:  [] Chronic kidney disease   [] Difficult  urination  [] Frequent urination   [] Blood in urine Skin:  [] Rashes   [] Ulcers  Psychological:  [] History of anxiety   []  History of major depression.  Physical Examination  There were no vitals filed for this visit. There is no height or weight on file to calculate BMI. Gen: WD/WN, NAD Head: [place]/AT, No temporalis wasting.  Ear/Nose/Throat: Hearing grossly intact, nares w/o erythema or drainage, pinna without lesions Eyes: PER, EOMI, sclera nonicteric.  Neck: Supple, no gross masses.  No JVD.  Pulmonary:  Good air movement, no audible wheezing, no use of accessory muscles.  Cardiac: RRR, precordium not hyperdynamic. Vascular:  scattered varicosities present bilaterally.  Moderate to severe venous stasis changes to the legs bilaterally.  1+ soft pitting edema, CEAP C4sEpAsPr  Vessel Right Left  Radial Palpable Palpable  Gastrointestinal: soft, non-distended. No guarding/no peritoneal signs.  Musculoskeletal: M/S 5/5 throughout.  No deformity.  Neurologic: CN 2-12 intact. Pain and light touch intact in extremities.  Symmetrical.  Speech is fluent. Motor exam as listed above. Psychiatric: Judgment intact, Mood & affect appropriate for pt's clinical situation. Dermatologic: Venous rashes no ulcers noted.  No changes consistent with cellulitis. Lymph : No lichenification or skin changes of chronic lymphedema.  CBC Lab Results  Component Value Date   WBC 3.7 (L) 02/06/2023   HGB 12.8 (L) 02/06/2023   HCT 38.2 (L) 02/06/2023   MCV 90.7 02/06/2023   PLT 107 (L) 02/06/2023    BMET    Component Value Date/Time   NA 140 02/06/2023 0448   K 3.5 02/06/2023 0448   CL 112 (H) 02/06/2023 0448   CO2 21 (L) 02/06/2023 0448   GLUCOSE 83 02/06/2023 0448   BUN 24 (H) 02/06/2023 0448   CREATININE 0.87 02/06/2023 0448   CREATININE 1.03 10/08/2020 0820   CALCIUM 9.0 02/06/2023 0448   GFRNONAA >60 02/06/2023 0448   GFRNONAA 81 10/08/2020 0820   GFRAA 94 10/08/2020 0820   CrCl cannot be  calculated (Patient's most recent lab result is older than the maximum 21 days allowed.).  COAG Lab Results  Component Value Date   INR 1.0 02/17/2022   INR 0.9 06/17/2019    Radiology No results found.   Assessment/Plan 1. Lymphedema (Primary) Recommend:  No surgery or intervention at this point in time. He is doing very well and his skin is supple and the edema minimal.   I have reviewed my discussion with the patient regarding lymphedema and why it  causes symptoms.  Patient will continue wearing graduated compression on a daily basis. The patient should put the compression on first thing in the morning and removing them in the  evening. The patient should not sleep in the compression.   In addition, behavioral modification throughout the day will be continued.  This will include frequent elevation (such as in a recliner), use of over the counter pain medications as needed and exercise such as walking.  The systemic causes for chronic edema such as liver, kidney and cardiac etiologies does not appear to have significant changed over the past year.    The patient will continue aggressive use of the  lymph pump.  This will continue to improve the edema control and prevent sequela such as ulcers and infections.   The patient will follow-up with me on an annual basis.   2. Chronic venous insufficiency Recommend:  No surgery or intervention at this point in time. He is doing very well and his skin is supple and the edema minimal.    I have reviewed my discussion with the patient regarding lymphedema and why it  causes symptoms.  Patient will continue wearing graduated compression on a daily basis. The patient should put the compression on first thing in the morning and removing them in the evening. The patient should not sleep in the compression.   In addition, behavioral modification throughout the day will be continued.  This will include frequent elevation (such as in a recliner),  use of over the counter pain medications as needed and exercise such as walking.  The systemic causes for chronic edema such as liver, kidney and cardiac etiologies does not appear to have significant changed over the past year.    The patient will continue aggressive use of the  lymph pump.  This will continue to improve the edema control and prevent sequela such as ulcers and infections.   The patient will follow-up with me on an annual basis.   3. Essential hypertension Continue antihypertensive medications as already ordered, these medications have been reviewed and there are no changes at this time.  4. Chronic diastolic CHF (congestive heart failure) (HCC) Continue cardiac and antihypertensive medications as already ordered and reviewed, no changes at this time.  Continue statin as ordered and reviewed, no changes at this time  Diuretics PRN per primary service    Cordella Shawl, MD  01/07/2024 12:26 PM

## 2024-01-09 ENCOUNTER — Ambulatory Visit (INDEPENDENT_AMBULATORY_CARE_PROVIDER_SITE_OTHER): Payer: Medicare Other | Admitting: Vascular Surgery

## 2024-01-09 ENCOUNTER — Encounter (INDEPENDENT_AMBULATORY_CARE_PROVIDER_SITE_OTHER): Payer: Self-pay | Admitting: Vascular Surgery

## 2024-01-09 VITALS — BP 122/74 | HR 80 | Resp 18 | Ht 66.0 in | Wt 144.0 lb

## 2024-01-09 DIAGNOSIS — I872 Venous insufficiency (chronic) (peripheral): Secondary | ICD-10-CM

## 2024-01-09 DIAGNOSIS — I5032 Chronic diastolic (congestive) heart failure: Secondary | ICD-10-CM

## 2024-01-09 DIAGNOSIS — I10 Essential (primary) hypertension: Secondary | ICD-10-CM | POA: Diagnosis not present

## 2024-01-09 DIAGNOSIS — I890 Lymphedema, not elsewhere classified: Secondary | ICD-10-CM

## 2024-05-15 ENCOUNTER — Encounter (INDEPENDENT_AMBULATORY_CARE_PROVIDER_SITE_OTHER): Payer: Self-pay

## 2025-01-05 NOTE — Progress Notes (Unsigned)
 "                              MRN : [id number]  Andres [name] is a 59 y.o. (11-Mar-1966) male who presents with chief complaint of legs swell.  History of Present Illness:   The patient returns to the office for followup evaluation regarding leg swelling. He has drug induced parkinson's and fairly severe tardive dyskinesia and is currently living in a group home.  At the time of his last visit in May he was under going several changes and was having difficulty with daily compression and using the pump so his swelling had become less well controlled.  Now with the extra help he has in the group home is is much better.  The swelling has persisted but with the lymph pump is under much, much better controlled. The pain associated with swelling is decreased. There have not been any interval development of a ulcerations or wounds.   The patient denies problems with the pump, noting it is working well and the leggings are in good condition.   Since the previous visit the patient has been wearing graduated compression stockings and using the lymph pump on a routine basis and  has noted significant improvement in the lymphedema.    Patient stated the lymph pump has been helpful with the treatment of the lymphedema.   Active Medications[1]  Past Medical History:  Diagnosis Date   Allergy    Essential hypertension    Hypothyroidism    Tardive dyskinesia     Past Surgical History:  Procedure Laterality Date   ANKLE SURGERY     COLON SURGERY     Father had surgery, not the patient   COLONOSCOPY WITH PROPOFOL  N/A 01/07/2023   Procedure: COLONOSCOPY WITH PROPOFOL ;  Surgeon: Maryruth Ole DASEN, MD;  Location: [facility];  Service: Endoscopy;  Laterality: N/A;  1st Case   HERNIA REPAIR     INTRAMEDULLARY (IM) NAIL INTERTROCHANTERIC Right 06/18/2019   Procedure: INTRAMEDULLARY (IM) NAIL INTERTROCHANTRIC;  Surgeon: Leora Lynwood SAUNDERS, MD;  Location: [facility];  Service: Orthopedics;  Laterality:  Right;   INTRAMEDULLARY (IM) NAIL INTERTROCHANTERIC Left 02/18/2022   Procedure: INTRAMEDULLARY (IM) NAIL INTERTROCHANTRIC;  Surgeon: Leora Lynwood SAUNDERS, MD;  Location: [facility];  Service: Orthopedics;  Laterality: Left;   TONSILLECTOMY      Social History Social History[2]  Family History Family History  Problem Relation Age of Onset   Diabetes Mother    Stroke Mother    Cancer Father        colon   Colon cancer Father    Colon cancer Other    Mental illness Neg Hx     Allergies[3]   REVIEW OF SYSTEMS (Negative unless checked)  Constitutional: [] Weight loss  [] Fever  [] Chills Cardiac: [] Chest pain   [] Chest pressure   [] Palpitations   [] Shortness of breath when laying flat   [] Shortness of breath with exertion. Vascular:  [] Pain in legs with walking   [x] Pain in legs with standing  [] History of DVT   [] Phlebitis   [x] Swelling in legs   [] Varicose veins   [] Non-healing ulcers Pulmonary:   [] Uses home oxygen   [] Productive cough   [] Hemoptysis   [] Wheeze  [] COPD   [] Asthma Neurologic:  [] Dizziness   [] Seizures   [] History of stroke   [] History of TIA  [] Aphasia   [] Vissual changes   [] Weakness or numbness in arm   [] Weakness  or numbness in leg Musculoskeletal:   [] Joint swelling   [] Joint pain   [] Low back pain Hematologic:  [] Easy bruising  [] Easy bleeding   [] Hypercoagulable state   [] Anemic Gastrointestinal:  [] Diarrhea   [] Vomiting  [] Gastroesophageal reflux/heartburn   [] Difficulty swallowing. Genitourinary:  [] Chronic kidney disease   [] Difficult urination  [] Frequent urination   [] Blood in urine Skin:  [] Rashes   [] Ulcers  Psychological:  [] History of anxiety   []  History of major depression.  Physical Examination  There were no vitals filed for this visit. There is no height or weight on file to calculate BMI. Gen: WD/WN, NAD Head: [place]/AT, No temporalis wasting.  Ear/Nose/Throat: Hearing grossly intact, nares w/o erythema or drainage, pinna without lesions Eyes: PER,  EOMI, sclera nonicteric.  Neck: Supple, no gross masses.  No JVD.  Pulmonary:  Good air movement, no audible wheezing, no use of accessory muscles.  Cardiac: RRR, precordium not hyperdynamic. Vascular:  scattered varicosities present bilaterally.  Severe venous stasis changes to the legs bilaterally.  1+ soft pitting edema, CEAP C4sEpAsPr  Vessel Right Left  Radial Palpable Palpable  Gastrointestinal: soft, non-distended. No guarding/no peritoneal signs.  Musculoskeletal: M/S 5/5 throughout.  No deformity.  Neurologic: CN 2-12 intact. Pain and light touch intact in extremities.  Symmetrical.  Speech is fluent. Motor exam as listed above. Psychiatric: Judgment intact, Mood & affect appropriate for pt's clinical situation. Dermatologic: Venous rashes no ulcers noted.  No changes consistent with cellulitis. Lymph : No lichenification or skin changes of chronic lymphedema.  CBC Lab Results  Component Value Date   WBC 3.7 (L) 02/06/2023   HGB 12.8 (L) 02/06/2023   HCT 38.2 (L) 02/06/2023   MCV 90.7 02/06/2023   PLT 107 (L) 02/06/2023    BMET    Component Value Date/Time   NA 140 02/06/2023 0448   K 3.5 02/06/2023 0448   CL 112 (H) 02/06/2023 0448   CO2 21 (L) 02/06/2023 0448   GLUCOSE 83 02/06/2023 0448   BUN 24 (H) 02/06/2023 0448   CREATININE 0.87 02/06/2023 0448   CREATININE 1.03 10/08/2020 0820   CALCIUM 9.0 02/06/2023 0448   GFRNONAA >60 02/06/2023 0448   GFRNONAA 81 10/08/2020 0820   GFRAA 94 10/08/2020 0820   CrCl cannot be calculated (Patient's most recent lab result is older than the maximum 21 days allowed.).  COAG Lab Results  Component Value Date   INR 1.0 02/17/2022   INR 0.9 06/17/2019    Radiology No results found.   Assessment/Plan 1. Lymphedema (Primary) Recommend:  No surgery or intervention at this point in time.    I have reviewed my discussion with the patient regarding lymphedema and why it  causes symptoms.  Patient will continue wearing  graduated compression on a daily basis. The patient should put the compression on first thing in the morning and removing them in the evening. The patient should not sleep in the compression.   In addition, behavioral modification throughout the day will be continued.  This will include frequent elevation (such as in a recliner), use of over the counter pain medications as needed and exercise such as walking.  The systemic causes for chronic edema such as liver, kidney and cardiac etiologies does not appear to have significant changed over the past year.    The patient will continue aggressive use of the  lymph pump.  This will continue to improve the edema control and prevent sequela such as ulcers and infections.   The patient will  follow-up with me on an annual basis.   2. Chronic venous insufficiency Recommend:  No surgery or intervention at this point in time.    I have reviewed my discussion with the patient regarding lymphedema and why it  causes symptoms.  Patient will continue wearing graduated compression on a daily basis. The patient should put the compression on first thing in the morning and removing them in the evening. The patient should not sleep in the compression.   In addition, behavioral modification throughout the day will be continued.  This will include frequent elevation (such as in a recliner), use of over the counter pain medications as needed and exercise such as walking.  The systemic causes for chronic edema such as liver, kidney and cardiac etiologies does not appear to have significant changed over the past year.    The patient will continue aggressive use of the  lymph pump.  This will continue to improve the edema control and prevent sequela such as ulcers and infections.   The patient will follow-up with me on an annual basis.   3. Essential hypertension Continue antihypertensive medications as already ordered, these medications have been reviewed and there  are no changes at this time.    Cordella Shawl, MD  01/05/2025 1:10 PM      [1]  No outpatient medications have been marked as taking for the 01/07/25 encounter (Appointment) with Shawl, Cordella MATSU, MD.  [2]  Social History Tobacco Use   Smoking status: Never   Smokeless tobacco: Never  Vaping Use   Vaping status: Never Used  Substance Use Topics   Alcohol use: No   Drug use: No  [3]  Allergies Allergen Reactions   Prednisone  Other (See Comments)    Did not like the way it made him feel  Did not like the way it made him feel    Lamotrigine  Rash   "

## 2025-01-07 ENCOUNTER — Encounter (INDEPENDENT_AMBULATORY_CARE_PROVIDER_SITE_OTHER): Payer: Self-pay | Admitting: Vascular Surgery

## 2025-01-07 ENCOUNTER — Ambulatory Visit (INDEPENDENT_AMBULATORY_CARE_PROVIDER_SITE_OTHER): Payer: Medicare Other | Admitting: Vascular Surgery

## 2025-01-07 VITALS — BP 122/74 | HR 73 | Resp 17 | Ht 66.0 in | Wt 131.2 lb

## 2025-01-07 DIAGNOSIS — I872 Venous insufficiency (chronic) (peripheral): Secondary | ICD-10-CM | POA: Diagnosis not present

## 2025-01-07 DIAGNOSIS — I890 Lymphedema, not elsewhere classified: Secondary | ICD-10-CM | POA: Diagnosis not present

## 2025-01-07 DIAGNOSIS — I10 Essential (primary) hypertension: Secondary | ICD-10-CM | POA: Diagnosis not present

## 2025-01-16 ENCOUNTER — Other Ambulatory Visit: Payer: Self-pay | Admitting: Gastroenterology

## 2025-01-16 DIAGNOSIS — K760 Fatty (change of) liver, not elsewhere classified: Secondary | ICD-10-CM

## 2025-01-24 ENCOUNTER — Ambulatory Visit
Admission: RE | Admit: 2025-01-24 | Discharge: 2025-01-24 | Disposition: A | Discharge status: Home / Self Care | Source: Ambulatory Visit | Attending: Gastroenterology | Admitting: Gastroenterology

## 2025-01-24 DIAGNOSIS — K760 Fatty (change of) liver, not elsewhere classified: Secondary | ICD-10-CM | POA: Insufficient documentation

## 2026-01-09 ENCOUNTER — Ambulatory Visit (INDEPENDENT_AMBULATORY_CARE_PROVIDER_SITE_OTHER): Admitting: Vascular Surgery
